# Patient Record
Sex: Female | Born: 1970 | Race: White | Hispanic: No | Smoking: Former smoker
Health system: Southern US, Community
[De-identification: ages and names within clinical notes are randomized; demographics above are authoritative.]

## PROBLEM LIST (undated history)

## (undated) DIAGNOSIS — F419 Anxiety disorder, unspecified: Secondary | ICD-10-CM

## (undated) DIAGNOSIS — F32A Depression, unspecified: Secondary | ICD-10-CM

## (undated) DIAGNOSIS — G4700 Insomnia, unspecified: Secondary | ICD-10-CM

## (undated) DIAGNOSIS — F329 Major depressive disorder, single episode, unspecified: Secondary | ICD-10-CM

## (undated) DIAGNOSIS — N809 Endometriosis, unspecified: Secondary | ICD-10-CM

## (undated) DIAGNOSIS — G43909 Migraine, unspecified, not intractable, without status migrainosus: Secondary | ICD-10-CM

## (undated) DIAGNOSIS — N200 Calculus of kidney: Secondary | ICD-10-CM

## (undated) HISTORY — DX: Major depressive disorder, single episode, unspecified: F32.9

## (undated) HISTORY — DX: Depression, unspecified: F32.A

## (undated) HISTORY — DX: Insomnia, unspecified: G47.00

## (undated) HISTORY — DX: Endometriosis, unspecified: N80.9

## (undated) HISTORY — PX: ENDOMETRIAL ABLATION: SHX621

---
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
## 2000-01-09 ENCOUNTER — Other Ambulatory Visit: Admission: RE | Admit: 2000-01-09 | Discharge: 2000-01-09 | Payer: Self-pay | Admitting: Obstetrics and Gynecology

## 2000-05-02 ENCOUNTER — Other Ambulatory Visit: Admission: RE | Admit: 2000-05-02 | Discharge: 2000-05-02 | Payer: Self-pay | Admitting: Obstetrics and Gynecology

## 2001-10-02 ENCOUNTER — Encounter: Admission: RE | Admit: 2001-10-02 | Discharge: 2001-10-02 | Payer: Self-pay | Admitting: Obstetrics and Gynecology

## 2001-10-02 ENCOUNTER — Encounter: Payer: Self-pay | Admitting: Obstetrics and Gynecology

## 2001-12-04 ENCOUNTER — Encounter: Admission: RE | Admit: 2001-12-04 | Discharge: 2001-12-04 | Payer: Self-pay | Admitting: Obstetrics and Gynecology

## 2001-12-04 ENCOUNTER — Encounter: Payer: Self-pay | Admitting: Obstetrics and Gynecology

## 2002-06-01 ENCOUNTER — Encounter: Payer: Self-pay | Admitting: Obstetrics and Gynecology

## 2002-06-01 ENCOUNTER — Encounter: Admission: RE | Admit: 2002-06-01 | Discharge: 2002-06-01 | Payer: Self-pay | Admitting: Obstetrics and Gynecology

## 2002-07-07 ENCOUNTER — Encounter: Admission: RE | Admit: 2002-07-07 | Discharge: 2002-07-07 | Payer: Self-pay | Admitting: Obstetrics and Gynecology

## 2002-07-07 ENCOUNTER — Encounter: Payer: Self-pay | Admitting: Obstetrics and Gynecology

## 2002-08-18 ENCOUNTER — Ambulatory Visit (HOSPITAL_COMMUNITY): Admission: RE | Admit: 2002-08-18 | Discharge: 2002-08-18 | Payer: Self-pay | Admitting: Obstetrics and Gynecology

## 2002-08-18 ENCOUNTER — Encounter (INDEPENDENT_AMBULATORY_CARE_PROVIDER_SITE_OTHER): Payer: Self-pay

## 2002-09-02 HISTORY — PX: PELVIC LAPAROSCOPY: SHX162

## 2002-10-05 ENCOUNTER — Other Ambulatory Visit: Admission: RE | Admit: 2002-10-05 | Discharge: 2002-10-05 | Payer: Self-pay | Admitting: Internal Medicine

## 2002-12-16 ENCOUNTER — Encounter (INDEPENDENT_AMBULATORY_CARE_PROVIDER_SITE_OTHER): Payer: Self-pay | Admitting: Specialist

## 2002-12-16 ENCOUNTER — Ambulatory Visit (HOSPITAL_BASED_OUTPATIENT_CLINIC_OR_DEPARTMENT_OTHER): Admission: RE | Admit: 2002-12-16 | Discharge: 2002-12-16 | Payer: Self-pay | Admitting: Obstetrics and Gynecology

## 2003-03-30 ENCOUNTER — Other Ambulatory Visit: Admission: RE | Admit: 2003-03-30 | Discharge: 2003-03-30 | Payer: Self-pay | Admitting: Gynecology

## 2003-04-14 ENCOUNTER — Encounter: Admission: RE | Admit: 2003-04-14 | Discharge: 2003-07-13 | Payer: Self-pay | Admitting: Gynecology

## 2003-10-27 ENCOUNTER — Inpatient Hospital Stay (HOSPITAL_COMMUNITY): Admission: RE | Admit: 2003-10-27 | Discharge: 2003-10-30 | Payer: Self-pay | Admitting: Gynecology

## 2003-12-15 ENCOUNTER — Other Ambulatory Visit: Admission: RE | Admit: 2003-12-15 | Discharge: 2003-12-15 | Payer: Self-pay | Admitting: Gynecology

## 2004-03-16 ENCOUNTER — Other Ambulatory Visit: Admission: RE | Admit: 2004-03-16 | Discharge: 2004-03-16 | Payer: Self-pay | Admitting: Gynecology

## 2005-06-12 ENCOUNTER — Encounter: Admission: RE | Admit: 2005-06-12 | Discharge: 2005-06-12 | Payer: Self-pay | Admitting: Emergency Medicine

## 2005-08-22 ENCOUNTER — Encounter: Admission: RE | Admit: 2005-08-22 | Discharge: 2005-08-22 | Payer: Self-pay | Admitting: Emergency Medicine

## 2005-08-30 ENCOUNTER — Other Ambulatory Visit: Admission: RE | Admit: 2005-08-30 | Discharge: 2005-08-30 | Payer: Self-pay | Admitting: Gynecology

## 2006-09-22 ENCOUNTER — Other Ambulatory Visit: Admission: RE | Admit: 2006-09-22 | Discharge: 2006-09-22 | Payer: Self-pay | Admitting: Gynecology

## 2007-09-25 ENCOUNTER — Other Ambulatory Visit: Admission: RE | Admit: 2007-09-25 | Discharge: 2007-09-25 | Payer: Self-pay | Admitting: Gynecology

## 2008-06-03 ENCOUNTER — Emergency Department (HOSPITAL_COMMUNITY): Admission: EM | Admit: 2008-06-03 | Discharge: 2008-06-04 | Payer: Self-pay | Admitting: Emergency Medicine

## 2009-05-09 ENCOUNTER — Ambulatory Visit: Payer: Self-pay | Admitting: Gynecology

## 2009-05-09 ENCOUNTER — Encounter: Payer: Self-pay | Admitting: Gynecology

## 2009-05-09 ENCOUNTER — Other Ambulatory Visit: Admission: RE | Admit: 2009-05-09 | Discharge: 2009-05-09 | Payer: Self-pay | Admitting: Gynecology

## 2009-05-10 ENCOUNTER — Ambulatory Visit: Payer: Self-pay | Admitting: Gynecology

## 2009-09-02 HISTORY — PX: TONSILLECTOMY: SUR1361

## 2011-01-18 NOTE — H&P (Signed)
NAME:  LELIANA, KONTZ                        ACCOUNT NO.:  [id number]   MEDICAL RECORD NO.:  [id number]                   PATIENT TYPE:  AMB   LOCATION:  [facility]C                                  FACILITY:  [facility]   PHYSICIAN:  Artist Pais, M.D.                 DATE OF BIRTH:  [birth year]   DATE OF ADMISSION:  08/18/2002  DATE OF DISCHARGE:                                HISTORY & PHYSICAL   HISTORY OF PRESENT ILLNESS:  The patient is a 40 year old Caucasian female,  gravida 1, para 1, who is in the progress of attempting pregnancy.  She has  continued to have significant dysmenorrhea and left lower quadrant pain, as  well as menorrhagia.  As a work-up for the menorrhagia and continued  dysmenorrhea, she underwent a pelvic ultrasound which showed an 11.1 mm  thickness.  Because of the abnormal uterine bleeding and endometrial  thickening seen on pelvic ultrasound, she underwent a sonohysterogram and  was found to have diffuse endometrial thickening throughout the endometrial  cavity without evidence of a focal polyp, but diffuse endometrial  thickening, at one point 2 cm.  The decision was made to undergo D&C and  hysteroscopy because of the several areas of thickening which appear to be  polypoid in nature.  The risks of surgery, including anesthetic  complications, hemorrhage, infection, and damage to adjacent structures,  including bladder, bowel, blood vessels, and ureters were discussed with the  patient.  She was made aware of the risk of uterine perforation which could  result in overwhelming life-threatening hemorrhage requiring emergent  hysterectomy or uterine perforation which could result in bowel damage  resulting in emergent colostomy for overwhelming life-threatening  peritonitis.  She expresses understand of and acceptance of these risks and  knows that we need to look at these polypoid areas more closely.   PAST OBSTETRICAL AND GYNECOLOGICAL HISTORY:  Menarche at age  10-13.  Cycle  interval duration 28-30 days with a four-day duration of flow with extremely  heavy cycle the first two days and significant dysmenorrhea.   PAST MEDICAL HISTORY:  1. Depression and anxiety.  2. Infertility.   ALLERGIES:  PENICILLIN and SULFA.   CURRENT MEDICATIONS:  1. Zoloft 50 mg daily.  2. Aleve.  3. Prenatal vitamins.   PAST SURGICAL HISTORY:  C-section in August of 1997.   FAMILY HISTORY:  There is no family history of colon, breast, ovarian, or  prostate cancer.  The patient's mother is 37, alive and well.  Her father is  60, alive and well.  One sister, age 60, alive and well.  One grandmother  died of  an MI.  Her maternal grandparents both have Alzheimer's.   SOCIAL HISTORY:  The patient is a Museum/gallery exhibitions officer.  She smokes one-  and-a-half packs of cigarettes per day and drinks alcohol socially.   REVIEW OF SYSTEMS:  Noncontributory, except as noted above.  Denies  headache, visual changes, chest pain, shortness of breath, abdominal pain,  change in bowel habits, unintentional weight loss, dysuria, urgency,  frequency, vaginal discharge, and pain or bleeding with intercourse.   PHYSICAL EXAMINATION:  GENERAL APPEARANCE:  A well-developed Caucasian  female.  VITAL SIGNS:  Blood pressure 102/70, heart rate 72.  WEIGHT:  150 pounds.  HEENT:  Normal.  NECK:  Supple without thyromegaly, adenopathy, or nodules.  CHEST:  Clear to auscultation.  BREASTS:  Symmetric without masses.  No dimpling, retraction, or nipple  discharge.  CARDIAC:  Regular rate and rhythm without extra sounds or murmurs.  ABDOMEN:  Soft and nontender.  No hepatosplenomegaly or masses.  PELVIC:  Normal external female genitalia.  No vulvar, vaginal, or cervical  lesions.  A Pap smear performed on July 02, 2001, was within normal  limits.  The patient is scheduled for another annual exam.  The pelvic  examination revealed the uterus to be normal and nontender without any   adnexal mass palpated.  RECTAL:  Excellent sphincter tone.  Confirms pelvic exam.  No masses  palpated.   IMPRESSION AND PLAN:  The patient is a 40 year old Caucasian female with  diffuse polypoid thickening on sonohysterogram, menorrhea, and dysmenorrhea,  admitted for [facility] and hysteroscopy to rule out an endometrial polyp.  The  patient expresses an understand of and acceptance of these risks and desires  to proceed with surgery.                                                Artist Pais, M.D.    DC/MEDQ  D:  08/18/2002  T:  08/18/2002  Job:  [id number]

## 2011-01-18 NOTE — Op Note (Signed)
NAME:  Marie Ingram, Marie Ingram                        ACCOUNT NO.:  [id number]   MEDICAL RECORD NO.:  [id number]                   PATIENT TYPE:  INP   LOCATION:  9120                                 FACILITY:  [facility]   PHYSICIAN:  Timothy P. Fontaine, M.D.           DATE OF BIRTH:  1970-12-09   DATE OF PROCEDURE:  10/27/2003  DATE OF DISCHARGE:                                 OPERATIVE REPORT   PREOPERATIVE DIAGNOSIS:  Pregnancy at term, prior cesarean section, desires  repeat cesarean section, mullerian abnormality.   POSTOPERATIVE DIAGNOSIS:  Pregnancy at term, prior cesarean section, desires  repeat cesarean section, mullerian abnormality.   PROCEDURE:  Repeat low transverse cervical cesarean section.   SURGEON:  Timothy P. Fontaine, M.D.   ASSISTANT:  Ivor Costa. Farrel Gobble, M.D.   ANESTHESIA:  Spinal.   ESTIMATED BLOOD LOSS:  Less than 500 mL.   COMPLICATIONS:  None.   FINDINGS:  At 30, normal female, Apgars 9 and 9, weight 7 pounds 14 ounces.  On pelvic anatomy inspection, the patient appears to have a rudimentary horn  on the right with a small protuberance at the level of the round ligament  insertion.  There was no fallopian tube identified.  There is a normal  appearing ovary on that side.  The left side is normal with normal fallopian  tube and ovary.  Of note, the lower uterine segment was transparent upon  entry to the abdomen with an extremely thin lower uterine segment.   DESCRIPTION OF PROCEDURE:  The patient was taken to the operating room,  underwent spinal anesthesia, was placed in the left tilt supine position,  received an abdominal preparation with Betadine solution.  The bladder was  empty with indwelling Foley catheterization placed under sterile technique  by nursing personnel.  The patient was draped in the usual fashion.  After  assuring adequate anesthesia, the abdomen was sharply entered through a  repeat Pfannenstiel incision achieving adequate hemostasis at  all levels.  The bladder flap was sharply developed.  The lower uterine segment was  transparent with vernix visualized through the thin lower uterine segment  which was sharply incised and bluntly extended laterally.  The fluid was  noted to be clear.  The infants head was delivered through the incision and  the nares and mouth were suctioned.  The rest of the infant was delivered.  The cord was doubly clamped and cut.  The infant was handed to pediatrics in  attendance.  Samples of cord blood were obtained.  The placenta was  spontaneously extracted and noted to be intact.  The uterus was exteriorized  and the endometrial cavity explored with a sponge to remove all placental  and membrane fragments.  The patient received 1 gram Ancef antibiotic  prophylaxis.  The lower uterine segment was initially closed in one layer  using 0 Vicryl suture in a running interlocking stitch.  A second  imbricating suture was  then initially placed but due to the thin, fragile  nature of the lower uterine segment, the initial sutures tore and it was  decided to abandon the second row.  Inspection of the pelvic anatomy was  noted as above.  The uterus was returned to the abdomen which was copiously  irrigated.  Adequate hemostasis was visualized.  The anterior fascia was  reapproximated using 0 Vicryl suture in a running stitch.  The subcutaneous  tissues were irrigated, adequate hemostasis was achieved with  electrocautery.  The skin was reapproximated using 4-0 Vicryl in a running  subcuticular stitch.  Steri-Strips and Benzoin were applied.  A sterile  dressing was applied.  The patient was taken to the recovery room in good  condition having tolerated the procedure well.                                               Timothy P. Audie Box, M.D.    TPF/MEDQ  D:  10/27/2003  T:  10/27/2003  Job:  [id number]

## 2011-01-18 NOTE — Discharge Summary (Signed)
NAME:  Marie Ingram, Marie Ingram                        ACCOUNT NO.:  [id number]   MEDICAL RECORD NO.:  [id number]                   PATIENT TYPE:  INP   LOCATION:  9120                                 FACILITY:  [facility]   PHYSICIAN:  Charles A. Sydnee Cabal, MD            DATE OF BIRTH:  16-Nov-1970   DATE OF ADMISSION:  10/27/2003  DATE OF DISCHARGE:  10/30/2003                                 DISCHARGE SUMMARY   PRIMARY DISCHARGE DIAGNOSES:  1. Term pregnancy.  2. Primary cesarean section.   PROCEDURE:  Repeat low transverse cesarean section.   DISPOSITION:  The patient was discharged home to follow up in the office  with Dr. Audie Box in 10 days.  She is given a prescription for Percocet  5/325 one to two p.o. q.4h. as needed, #40.   CONVALESCENCE INSTRUCTIONS:  1. Notify of temperature greater than 101 or increased pain or bleeding.  2. Precautioned against driving for about 10 days.  3. Take a shower for two weeks and not a bath.  4. No heavy lifting greater than 25 pounds.   LABORATORY DATA:  Postoperative hematocrit on postoperative day number one  was 30.6, hemoglobin 10.2.   HOSPITAL COURSE:  The patient was admitted and underwent surgery as noted  above.  Postoperatively, she had a routine postoperative course.  She had  good voiding after the Foley catheter was discontinued on postoperative day  number one.  She had no problems with return of flatus and was given a  general diet on postoperative day number one and tolerated her diet well.  She was given p.o. medications and did well.  Continued on postoperative day  number two doing well and is discharged home now on postoperative day number  three without any complications.  She is to follow up as noted above.                                               Charles A. Sydnee Cabal, MD    CAD/MEDQ  D:  10/30/2003  T:  10/30/2003  Job:  [id number]

## 2011-01-18 NOTE — H&P (Signed)
NAME:  Marie Ingram, Marie Ingram                        ACCOUNT NO.:  [id number]   MEDICAL RECORD NO.:  [id number]                   PATIENT TYPE:  INP   LOCATION:  NA                                   FACILITY:  [facility]   PHYSICIAN:  Timothy P. Fontaine, M.D.           DATE OF BIRTH:  Feb 15, 1971   DATE OF ADMISSION:  10/27/2003  DATE OF DISCHARGE:                                HISTORY & PHYSICAL   CHIEF COMPLAINT:  1. Pregnancy at term.  2. History of prior cesarean section, desires repeat cesarean section.   HISTORY OF PRESENT ILLNESS:  A 40 year old G29, P23 female at term gestation,  history of cesarean section for breech presentation who desires repeat  cesarean section after counseling for trial of labor.  For the remainder of  her history, see her Hollister examination.   PHYSICAL EXAMINATION:  HEENT:  Normal.  LUNGS:  Clear.  CARDIOVASCULAR:  Regular rate without murmurs, rubs, or gallops.  ABDOMEN:  Gravid term fundus, positive fetal heart tones.  PELVIC:  Deferred.   ASSESSMENT:  A 40 year old G2, P77 female term pregnancy, prior cesarean  section who desires repeat cesarean section after trial of labor counseling.  The risks, benefits, indications and alternatives were reviewed to include  bleeding, transfusion, infection, prolonged antibiotics, abscess formation  requiring reoperation and drainage, wound complications requiring opening  and draining of incisions, closure by secondary intention, inadvertant  injury to internal organs including bowel, bladder, ureters, vessels and  nerves necessitating major exploratory reparative surgeries and future  reparative surgeries including ostomy formation as well as fetal injury,  musculoskeletal, neural, scalpel injuries, all of which were understood and  accepted.  The patient does not want tubal sterilization at the same time as  her repeat cesarean section.                                               Timothy P. Audie Box, M.D.    TPF/MEDQ  D:  10/10/2003  T:  10/10/2003  Job:  [id number]

## 2011-01-18 NOTE — Op Note (Signed)
NAME:  JALEY, YAN                        ACCOUNT NO.:  [id number]   MEDICAL RECORD NO.:  [id number]                   PATIENT TYPE:  AMB   LOCATION:  SDC                                  FACILITY:  [facility]   PHYSICIAN:  Artist Pais, M.D.                 DATE OF BIRTH:  06/24/1971   DATE OF PROCEDURE:  08/18/2002  DATE OF DISCHARGE:                                 OPERATIVE REPORT   PREOPERATIVE DIAGNOSES:  1. Dysmenorrhea, menorrhagia.  2. Polypoid endometrium on sonohysterogram.   POSTOPERATIVE DIAGNOSES:  1. Dysmenorrhea, menorrhagia.  2. Polypoid endometrium on sonohysterogram.   PROCEDURE:  Dilatation and curettage, hysteroscopy, polypectomy.   SURGEON:  Artist Pais, M.D.   ANESTHESIA:  General endotracheal plus 20 cc 1% lidocaine paracervical  block.   ESTIMATED BLOOD LOSS:  Minimal.   FLUIDS:  1600 cc crystalloid.   COMPLICATIONS:  None.   DRAINS:  None.   FINDINGS:  Polypoid endometrium of the left side wall.   DESCRIPTION OF PROCEDURE:  The patient was brought to the operating room and  identified on the operating room table.  After induction adequate general  endotracheal anesthesia, the patient was placed in the dorsal lithotomy  position and prepped and draped in the usual sterile fashion.  The bladder  was straight catheterized for approximately 50 cc of clear yellow urine.  Examination under anesthesia revealed the uterus to be small, anteverted.  A  speculum was placed, and the anterior lip of the cervix was grasped with a  single-tooth tenaculum.  The cervix was very gently dilated up to a #25  Pratt dilator.  Dilatation proceeded very gently to decrease the risk of  uterine perforation.  The uterus sounded to 7 cm.  Using sorbitol as a  distending medium, the [facility] hysteroscope was placed without difficulty and a  careful and thorough hysteroscopic examination was performed.  The left  tubal ostium was identified.  At the fundus, there was noted  to be normal  endometrium.  However, along the left uterine sidewall, there was noted to  be copious polypoid endometrium and thickened endometrium on the posterior  uterine wall as well.  I was unable to see the right tubal ostium as there  was a bubble obscuring the ostium, and I was unable to move the bubble.  Subsequently, the scope was withdrawn, and using a serrated curet, a careful  curettage was performed in a clockwise position.  The Randall stone forceps  were placed, and additional tissue was obtained.  The uterus was curetted  until a good cry was heard all around with copious polypoid tissue obtained  from the left sidewall.  The hysteroscope was again placed after a good cry  was heard all around and the all of the polypoid was noted to be removed in  its entirety, and the endometrium was noted to be sampled completed. At that  point, the  procedure was then terminated.  The patient was given 20 cc of 1%  lidocaine paracervical block for postoperative comfort.  The tenaculum was  removed.  There was noted to be no bleeding from the tenaculum or needle  injection site at the time of termination of the case.  The patient was  transferred to the recovery room in stable condition after all instrument,  sponge, needle counts were correct.   DISPOSITION:  She was given a postoperative D&C instruction sheet and urged  to call if there were any problems, and to return to the office in two to  three weeks for a postoperative evaluation.  She had previously been given  Ultram in the office, and she can take Ultram 50 mg every six hours as  needed for pain.                                               Artist Pais, M.D.    DC/MEDQ  D:  08/18/2002  T:  08/19/2002  Job:  [id number]

## 2011-01-18 NOTE — Op Note (Signed)
NAME:  Marie Ingram, Marie Ingram                        ACCOUNT NO.:  [id number]   MEDICAL RECORD NO.:  [id number]                   PATIENT TYPE:  AMB   LOCATION:  [facility]                                 FACILITY:  [facility]   PHYSICIAN:  Daniel L. Eda Paschal, M.D.           DATE OF BIRTH:  Sep 30, 1970   DATE OF PROCEDURE:  12/16/2002  DATE OF DISCHARGE:                                 OPERATIVE REPORT   PREOPERATIVE DIAGNOSES:  Pelvic pain, dyspareunia, endometriosis suspected.   POSTOPERATIVE DIAGNOSES:  Pelvic pain, dyspareunia, endometriosis, pelvic  adhesions.   OPERATION:  Laparoscopy with laser vaporization of both endometriosis and  adhesions.   SURGEON:  Daniel L. Eda Paschal, M.D.   ANESTHESIA:  General endotracheal.   INDICATIONS FOR PROCEDURE:  The patient is a 40 year old, gravida 1, para 1,  AB 0 who came to see me because of mid cycle bleeding, severe abdominal  cramping and significant dyspareunia on deep penetration. The patient has a  mother whose had endometriosis and endometriosis has been strongly suspected  based on the above. Ultrasound failed to reveal any endometriomas of either  ovary. She has not responded to medical therapy. She now enters the hospital  for laparoscopy and laser if appropriate.   FINDINGS:  At the time of surgery, the patient's uterus was significantly  involved with white plaques of endometriosis. It was mostly on the top of  the fundus and the posterior portion of the fundus and involved  approximately one-third of that area. It appeared to be somewhere between  superficial and deep. The patient had a very unusual right fallopian tube,  it had fusiform swelling of it in the proximal portion and then it appeared  almost as if the tube was missing in the middle portion and then normal  fimbria could be seen. The patient's right ovary was completely normal. The  patient's ileocecal junction was identified and her appendix was normal.  Right upper  quadrant was visualized and there was no disease. The patient  had an area of white endometriosis under her left uterosacral ligament of  approximately 1 1/2 cm, it was more superficial than deep. The patient's  left fallopian tube was normal with luxuriant fimbria. The patient's left  ovary had a deposit of endometriosis that was pigmented of about 1+ cm. It  was superficial rather than deep. The patient had adhesions of her sigmoid  colon that extended down to the round ligament on the left possibly related  to her previous cesarean section. In the cul-de-sac, there was no  endometriosis; however, there was extreme vascularity especially involving  the left uterosacral ligament. The ureters could be well seen and they  peristalsed normally and there was no adhesions involving them. 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 You really could not even see the dye extending to the  proximal portion of the right fallopian tube.   DESCRIPTION OF PROCEDURE:  After adequate general endotracheal anesthesia,  the patient was placed in the dorsal lithotomy position, prepped and draped  in the usual sterile manner. Her bladder was entered with a Robinson  catheter, a Jarcho  cannula was placed in the uterus, a pneumoperitoneum was  created with the Veress needle introduced subumbilically; through that the  operating laparoscope was placed. It was attached to a camera for  magnification. Two ports were placed in the lower part of her pelvis, one in  the midline and one in the left lower quadrant, through that a variety of  instrumentations was placed. First inspection of all pelvic organs was done  systematically and was noted above. 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 Using neodymium YAG laser  with a G4 tip over the bare fiber, 12 watts power, all areas of  endometriosis, first on the uterus and then on the left ovary were lasered  and removed. The ureters were easy to see and it was easy to stay away from  them. The white area of endometriosis and the left broad ligament could be  removed by elevating it and completely excising it and it was sent to  pathology for tissue diagnosis. The adhesions involving the colon to the  round ligament were taken down with the laser in such a fashion that the  colon now was completely away from the left adnexa. There were several areas  where a little bleeding was encountered and this was controlled with bipolar  coagulation. At the termination of the procedure, there was no bleeding  whatsoever. As noted above, dye would spill from the left tube but not the  right tube. All cul-de-sac fluid was removed, the pneumoperitoneum was  evacuated. The subumbilical fascial incisions closed with #0 Vicryl and the  skin incisions were closed with 3-0 Monocryl. Estimated blood loss for the  entire procedure was less than 100 mL with none replaced.                                               Daniel L. Eda Paschal, M.D.    Tonette Bihari  D:  12/16/2002  T:  12/16/2002  Job:  [id number]

## 2011-06-04 LAB — DIFFERENTIAL
Basophils Relative: 0
Eosinophils Absolute: 0.1
Lymphocytes Relative: 15

## 2011-06-04 LAB — COMPREHENSIVE METABOLIC PANEL
ALT: 12
Albumin: 3.8
Alkaline Phosphatase: 56
BUN: 18
CO2: 24
Chloride: 112
GFR calc Af Amer: >60
GFR calc non Af Amer: >60
Sodium: 138
Total Bilirubin: 0.5
Total Protein: 6.4

## 2011-06-04 LAB — CBC
MCHC: 32.8
Platelets: 228
RBC: 4.44
WBC: 8.2

## 2011-06-04 LAB — URINALYSIS, ROUTINE W REFLEX MICROSCOPIC
Specific Gravity, Urine: 1.029
Urobilinogen, UA: 0.2
pH: 6

## 2011-06-04 LAB — WET PREP, GENITAL
Trich, Wet Prep: NONE SEEN
Yeast Wet Prep HPF POC: NONE SEEN

## 2013-01-29 ENCOUNTER — Other Ambulatory Visit (HOSPITAL_COMMUNITY): Payer: Self-pay | Admitting: Physician Assistant

## 2013-01-29 ENCOUNTER — Ambulatory Visit (HOSPITAL_COMMUNITY)
Admission: RE | Admit: 2013-01-29 | Discharge: 2013-01-29 | Disposition: A | Discharge status: Home / Self Care | Payer: BC Managed Care – PPO | Source: Ambulatory Visit | Attending: Physician Assistant | Admitting: Physician Assistant

## 2013-01-29 DIAGNOSIS — S93609A Unspecified sprain of unspecified foot, initial encounter: Secondary | ICD-10-CM

## 2013-01-29 DIAGNOSIS — W010XXA Fall on same level from slipping, tripping and stumbling without subsequent striking against object, initial encounter: Secondary | ICD-10-CM | POA: Insufficient documentation

## 2013-01-29 DIAGNOSIS — Y92009 Unspecified place in unspecified non-institutional (private) residence as the place of occurrence of the external cause: Secondary | ICD-10-CM | POA: Insufficient documentation

## 2013-01-29 DIAGNOSIS — M79609 Pain in unspecified limb: Secondary | ICD-10-CM | POA: Insufficient documentation

## 2013-01-29 DIAGNOSIS — M7989 Other specified soft tissue disorders: Secondary | ICD-10-CM | POA: Insufficient documentation

## 2013-04-09 ENCOUNTER — Emergency Department (HOSPITAL_BASED_OUTPATIENT_CLINIC_OR_DEPARTMENT_OTHER): Payer: BC Managed Care – PPO

## 2013-04-09 ENCOUNTER — Emergency Department (HOSPITAL_BASED_OUTPATIENT_CLINIC_OR_DEPARTMENT_OTHER)
Admission: EM | Admit: 2013-04-09 | Discharge: 2013-04-09 | Disposition: A | Discharge status: Home / Self Care | Payer: BC Managed Care – PPO | Attending: Emergency Medicine | Admitting: Emergency Medicine

## 2013-04-09 ENCOUNTER — Encounter (HOSPITAL_BASED_OUTPATIENT_CLINIC_OR_DEPARTMENT_OTHER): Payer: Self-pay | Admitting: Emergency Medicine

## 2013-04-09 DIAGNOSIS — Z79899 Other long term (current) drug therapy: Secondary | ICD-10-CM | POA: Insufficient documentation

## 2013-04-09 DIAGNOSIS — R112 Nausea with vomiting, unspecified: Secondary | ICD-10-CM | POA: Insufficient documentation

## 2013-04-09 DIAGNOSIS — Z3202 Encounter for pregnancy test, result negative: Secondary | ICD-10-CM | POA: Insufficient documentation

## 2013-04-09 DIAGNOSIS — N200 Calculus of kidney: Secondary | ICD-10-CM | POA: Insufficient documentation

## 2013-04-09 DIAGNOSIS — G43909 Migraine, unspecified, not intractable, without status migrainosus: Secondary | ICD-10-CM

## 2013-04-09 DIAGNOSIS — F411 Generalized anxiety disorder: Secondary | ICD-10-CM | POA: Insufficient documentation

## 2013-04-09 HISTORY — DX: Anxiety disorder, unspecified: F41.9

## 2013-04-09 HISTORY — DX: Migraine, unspecified, not intractable, without status migrainosus: G43.909

## 2013-04-09 HISTORY — DX: Calculus of kidney: N20.0

## 2013-04-09 LAB — URINALYSIS, ROUTINE W REFLEX MICROSCOPIC
Hgb urine dipstick: NEGATIVE
Ketones, ur: NEGATIVE mg/dL

## 2013-04-09 LAB — CBC WITH DIFFERENTIAL/PLATELET
Basophils Absolute: 0 10*3/uL (ref 0.0–0.1)
Basophils Relative: 0 % (ref 0–1)
HCT: 40.2 % (ref 36.0–46.0)
Hemoglobin: 12.8 g/dL (ref 12.0–15.0)
Lymphocytes Relative: 7 % — ABNORMAL LOW (ref 12–46)
Monocytes Absolute: 0.4 10*3/uL (ref 0.1–1.0)
Neutro Abs: 9.2 10*3/uL — ABNORMAL HIGH (ref 1.7–7.7)
RBC: 4.49 MIL/uL (ref 3.87–5.11)
RDW: 14.9 % (ref 11.5–15.5)
WBC: 10.3 10*3/uL (ref 4.0–10.5)

## 2013-04-09 LAB — BASIC METABOLIC PANEL
BUN: 15 mg/dL (ref 6–23)
CO2: 27 mEq/L (ref 19–32)
Chloride: 100 mEq/L (ref 96–112)
Creatinine, Ser: 0.6 mg/dL (ref 0.50–1.10)
GFR calc Af Amer: >90 mL/min (ref >90)
GFR calc non Af Amer: >90 mL/min (ref >90)
Potassium: 4.1 mEq/L (ref 3.5–5.1)
Sodium: 138 mEq/L (ref 135–145)

## 2013-04-09 MED ORDER — METOCLOPRAMIDE HCL 5 MG/ML IJ SOLN
10.0000 mg | Freq: Once | INTRAMUSCULAR | Status: AC
  Administered 2013-04-09: 10 mg via INTRAMUSCULAR

## 2013-04-09 MED ORDER — PROMETHAZINE HCL 25 MG RE SUPP: 25.0000 mg | Freq: Four times a day (QID) | RECTAL | Status: DC | PRN

## 2013-04-09 MED ORDER — KETOROLAC TROMETHAMINE 30 MG/ML IJ SOLN
30.0000 mg | Freq: Once | INTRAMUSCULAR | Status: DC
  Filled 2013-04-09: qty 1

## 2013-04-09 MED ORDER — KETOROLAC TROMETHAMINE 30 MG/ML IJ SOLN
30.0000 mg | Freq: Once | INTRAMUSCULAR | Status: AC
  Administered 2013-04-09: 30 mg via INTRAMUSCULAR

## 2013-04-09 MED ORDER — METOCLOPRAMIDE HCL 5 MG/ML IJ SOLN
10.0000 mg | Freq: Once | INTRAMUSCULAR | Status: DC
  Filled 2013-04-09: qty 2

## 2013-04-09 MED ORDER — METOCLOPRAMIDE HCL 5 MG/ML IJ SOLN
10.0000 mg | Freq: Once | INTRAMUSCULAR | Status: AC
  Administered 2013-04-09: 10 mg via INTRAVENOUS
  Filled 2013-04-09: qty 2

## 2013-04-09 MED ORDER — HYDROMORPHONE HCL PF 1 MG/ML IJ SOLN
1.0000 mg | Freq: Once | INTRAMUSCULAR | Status: AC
  Administered 2013-04-09: 1 mg via INTRAVENOUS
  Filled 2013-04-09: qty 1

## 2013-04-09 NOTE — ED Notes (Signed)
MD at bedside. 

## 2013-04-09 NOTE — ED Notes (Signed)
Right flank pain and HA started yesterday.  Saw pmd.  Given Zofran, Flomax, and hydrocodone.  Today started vomiting.  Long hx of kidney stones but never vomited with them before. Passed on their own.  Took Imitrex at 1300.

## 2013-04-09 NOTE — ED Provider Notes (Signed)
CSN: [id number]     Arrival date & time 04/09/13  1518 History     First MD Initiated Contact with Patient 04/09/13 1654     Chief Complaint  Patient presents with  . Flank Pain  . Emesis  . Headache   (Consider location/radiation/quality/duration/timing/severity/associated sxs/prior Treatment) HPI Comments: Pt comes in with cc of right sided flank pain and headaches. Pt has hx of multiple renal stones. Her pain started yday, and is similar to her renal stones pain. She saw her PCP, she was provided with appropriate meds, and informed to go to the ER if there are nausea, emesis, fevers, chills. She started having nausea with emesis today, about 5-10 episodes of emesis, non bilioous, non bloody. No uti like, no hematuria, no vaginal discharge, bleeding. BM have been normal. No abd pain., No cough.  Pt has been having her migraine type headaches. No neuro complains. She suspects that the flank pain has led to flare up of her headache.  Patient is a 42 y.o. female presenting with flank pain, vomiting, and headaches. The history is provided by the patient.  Flank Pain Associated symptoms include headaches. Pertinent negatives include no chest pain, no abdominal pain and no shortness of breath.  Emesis Associated symptoms: headaches   Associated symptoms: no abdominal pain   Headache Associated symptoms: nausea and vomiting   Associated symptoms: no abdominal pain and no neck pain     Past Medical History  Diagnosis Date  . Migraines   . Kidney stones   . Anxiety    Past Surgical History  Procedure Laterality Date  . Cesarean section    . Laparoscopy     No family history on file. History  Substance Use Topics  . Smoking status: Never Smoker   . Smokeless tobacco: Not on file  . Alcohol Use: Yes     Comment: occ   OB History   Grav Para Term Preterm Abortions TAB SAB Ect Mult Living                 Review of Systems  Constitutional: Negative for activity change.   HENT: Negative for neck pain.   Respiratory: Negative for shortness of breath.   Cardiovascular: Negative for chest pain.  Gastrointestinal: Positive for nausea and vomiting. Negative for abdominal pain.  Genitourinary: Positive for flank pain. Negative for dysuria.  Neurological: Positive for headaches.    Allergies  Amoxicillin  Home Medications   Current Outpatient Rx  Name  Route  Sig  Dispense  Refill  . ALPRAZolam (XANAX) 0.5 MG tablet   Oral   Take 0.5 mg by mouth 4 (four) times daily.         . sertraline (ZOLOFT) 100 MG tablet   Oral   Take 150 mg by mouth daily.         . SUMAtriptan (IMITREX) 100 MG tablet   Oral   Take 100 mg by mouth every 2 (two) hours as needed for migraine.         . promethazine (PHENERGAN) 25 MG suppository   Rectal   Place 1 suppository (25 mg total) rectally every 6 (six) hours as needed for nausea.   12 each   0    BP 147/82  Pulse 91  Temp(Src) 97.9 F (36.6 C) (Oral)  Resp 16  Ht 5\' 5"  (1.651 m)  Wt 216 lb (97.977 kg)  BMI 35.94 kg/m2  SpO2 99%  LMP 04/02/2013 Physical Exam  Nursing note and vitals  reviewed. Constitutional: She is oriented to person, place, and time. She appears well-developed and well-nourished.  HENT:  Head: Normocephalic and atraumatic.  Eyes: EOM are normal. Pupils are equal, round, and reactive to light.  Neck: Neck supple.  Cardiovascular: Normal rate, regular rhythm and normal heart sounds.   No murmur heard. Pulmonary/Chest: Effort normal. No respiratory distress.  Abdominal: Soft. She exhibits no distension. There is no tenderness. There is no rebound and no guarding.  Neurological: She is alert and oriented to person, place, and time. No cranial nerve deficit. Coordination normal.  Skin: Skin is warm and dry.    ED Course   Procedures (including critical care time)  Labs Reviewed  CBC WITH DIFFERENTIAL - Abnormal; Notable for the following:    Neutrophils Relative % 89 (*)     Neutro Abs 9.2 (*)    Lymphocytes Relative 7 (*)    All other components within normal limits  BASIC METABOLIC PANEL - Abnormal; Notable for the following:    Glucose, Bld 105 (*)    All other components within normal limits  URINALYSIS, ROUTINE W REFLEX MICROSCOPIC  PREGNANCY, URINE   Ct Abdomen Pelvis Wo Contrast  04/09/2013   *RADIOLOGY REPORT*  Clinical Data: 42 year old female with right flank, abdominal and pelvic pain.  CT ABDOMEN AND PELVIS WITHOUT CONTRAST  Technique:  Multidetector CT imaging of the abdomen and pelvis was performed following the standard protocol without intravenous contrast.  Comparison: 06/04/2008 CT  Findings: A 4 mm nonobstructing right upper pole renal calculus is noted. There is no evidence of hydronephrosis or obstructing urinary calculi. The kidneys are otherwise unremarkable.  The liver, gallbladder, spleen, pancreas and adrenal glands are unremarkable.  Please note that parenchymal abnormalities may be missed as intravenous contrast was not administered.  No free fluid, enlarged lymph nodes, biliary dilation or abdominal aortic aneurysm identified.  The bowel, appendix and bladder are unremarkable. The uterus and adnexal regions are within normal limits. A small umbilical hernia containing fat is again noted.  No acute or suspicious bony abnormalities are identified.  IMPRESSION: No evidence of acute abnormality.  4 mm nonobstructing right upper pole renal calculus.   Original Report Authenticated By: Harmon Pier, M.D.   1. Nephrolithiasis   2. Migraine     MDM  Pt comes in with right sided flank pain and headaches. Has hx of multiple renal stones, but hasnt needed surgery. She states that her pain is similar to her previous attacks. Her pain has worsened and she has some nausea, emesis, despite taking meds prescribed, so we will get CT abdomen to look at the stone, and to ensure there is no other concerning etiology.  Headache is typical, with no neuro  deficits, or red flags.  7:59 PM Ct shows non obstructive stone. She had received 1 mg dilaudid, and her headache and flank pain have resolved. No concerns for emergent conditions like ovarian torsion, PID for the patient. Will d/c.  Derwood Kaplan, MD 04/09/13 2000

## 2013-04-09 NOTE — ED Notes (Signed)
Dr Rhunette Croft in room with patient now.

## 2013-04-09 NOTE — ED Notes (Signed)
Patient ambulatory to restroom without difficulty and with no assistance 

## 2013-08-23 ENCOUNTER — Other Ambulatory Visit: Payer: Self-pay | Admitting: Physician Assistant

## 2013-08-23 MED ORDER — ALPRAZOLAM 0.5 MG PO TABS: 0.5000 mg | ORAL_TABLET | Freq: Four times a day (QID) | ORAL | Status: DC

## 2013-09-17 ENCOUNTER — Ambulatory Visit (INDEPENDENT_AMBULATORY_CARE_PROVIDER_SITE_OTHER): Payer: BC Managed Care – PPO | Admitting: Physician Assistant

## 2013-09-17 ENCOUNTER — Encounter: Payer: Self-pay | Admitting: Physician Assistant

## 2013-09-17 VITALS — BP 118/78 | HR 88 | Temp 98.1°F | Resp 16 | Ht 65.0 in | Wt 226.0 lb

## 2013-09-17 DIAGNOSIS — J329 Chronic sinusitis, unspecified: Secondary | ICD-10-CM

## 2013-09-17 DIAGNOSIS — F411 Generalized anxiety disorder: Secondary | ICD-10-CM

## 2013-09-17 DIAGNOSIS — M26609 Unspecified temporomandibular joint disorder, unspecified side: Secondary | ICD-10-CM

## 2013-09-17 MED ORDER — AZITHROMYCIN 250 MG PO TABS: ORAL_TABLET | ORAL | Status: AC

## 2013-09-17 MED ORDER — ALPRAZOLAM 1 MG PO TABS: 1.0000 mg | ORAL_TABLET | Freq: Three times a day (TID) | ORAL | Status: DC | PRN

## 2013-09-17 MED ORDER — CYCLOBENZAPRINE HCL 10 MG PO TABS: ORAL_TABLET | ORAL | Status: DC

## 2013-09-17 MED ORDER — BUPROPION HCL ER (XL) 150 MG PO TB24: 150.0000 mg | ORAL_TABLET | ORAL | Status: DC

## 2013-09-17 MED ORDER — SUMATRIPTAN SUCCINATE 100 MG PO TABS: 100.0000 mg | ORAL_TABLET | ORAL | Status: DC | PRN

## 2013-09-17 NOTE — Patient Instructions (Signed)
What is the TMJ? The temporomandibular (tem-PUH-ro-man-DIB-yoo-ler) joint, or the TMJ, connects the upper and lower jawbones. This joint allows the jaw to open wide and move back and forth when you chew, talk, or yawn.There are also several muscles that help this joint move. There can be muscle tightness and pain in the muscle that can cause several symptoms.  What causes TMJ pain? There are many causes of TMJ pain. Repeated chewing (for example, chewing gum) and clenching your teeth can cause pain in the joint. Some TMJ pain has no obvious cause. What can I do to ease the pain? There are many things you can do to help your pain get better. When you have pain:  Eat soft foods and stay away from chewy foods (for example, taffy) Try to use both sides of your mouth to chew Don't chew gum Don't open your mouth wide (for example, during yawning or singing) Don't bite your cheeks or fingernails Lower your amount of stress and worry Applying a warm, damp washcloth to the joint may help. Over-the-counter pain medicines such as ibuprofen (one brand: Advil) or acetaminophen (one brand: Tylenol) might also help. Do not use these medicines if you are allergic to them or if your doctor told you not to use them. How can I stop the pain from coming back? When your pain is better, you can do these exercises to make your muscles stronger and to keep the pain from coming back:  Resisted mouth opening: Place your thumb or two fingers under your chin and open your mouth slowly, pushing up lightly on your chin with your thumb. Hold for three to six seconds. Close your mouth slowly. Resisted mouth closing: Place your thumbs under your chin and your two index fingers on the ridge between your mouth and the bottom of your chin. Push down lightly on your chin as you close your mouth. Tongue up: Slowly open and close your mouth while keeping the tongue touching the roof of the mouth. Side-to-side jaw movement: Place an  object about one fourth of an inch thick (for example, two tongue depressors) between your front teeth. Slowly move your jaw from side to side. Increase the thickness of the object as the exercise becomes easier Forward jaw movement: Place an object about one fourth of an inch thick between your front teeth and move the bottom jaw forward so that the bottom teeth are in front of the top teeth. Increase the thickness of the object as the exercise becomes easier. These exercises should not be painful. If it hurts to do these exercises, stop doing them and talk to your family doctor.    

## 2013-09-17 NOTE — Progress Notes (Signed)
   Subjective:    Patient ID: Marie Ingram, female    DOB: 1970/11/05, 43 y.o.   MRN: [id number]  HPI For one week she has been unable to sleep, mind racing, very anxious. She has been waking up with a right sided headache. She has had some post nasal drip, yellow/bloody mucus, cough in the morning. She is waking herself up snoring. Denies fever, chills, wheezing, SOB. She has been taking BC powders, aleve, mucinex/cough and nothing is helping.   Increase stress from the holidays, she is taking the xanax 3-4 times a day and taking two at a time so she is running out of medications.   Review of Systems  Constitutional: Negative.   HENT: Positive for congestion, postnasal drip, rhinorrhea and sinus pressure. Negative for dental problem, ear discharge, facial swelling, hearing loss, sneezing, sore throat, tinnitus, trouble swallowing and voice change.   Eyes: Negative.   Respiratory: Negative.   Cardiovascular: Negative.   Gastrointestinal: Negative.   Genitourinary: Negative.   Musculoskeletal: Positive for neck pain and neck stiffness. Negative for arthralgias, back pain, gait problem, joint swelling and myalgias.  Neurological: Positive for headaches. Negative for dizziness, tremors, seizures, syncope, facial asymmetry, speech difficulty, weakness, light-headedness and numbness.  Psychiatric/Behavioral: Positive for sleep disturbance and decreased concentration. Negative for suicidal ideas, hallucinations, behavioral problems, confusion, self-injury and agitation. The patient is nervous/anxious.        Objective:   Physical Exam  Constitutional: She appears well-developed and well-nourished.  HENT:  Head: Normocephalic and atraumatic.  Right Ear: External ear normal.  Nose: Right sinus exhibits frontal sinus tenderness. Left sinus exhibits frontal sinus tenderness.  Eyes: Conjunctivae and EOM are normal.  Neck: Normal range of motion. Neck supple.  + TMJ  Cardiovascular: Normal  rate, regular rhythm, normal heart sounds and intact distal pulses.   Pulmonary/Chest: Effort normal and breath sounds normal. No respiratory distress. She has no wheezes.  Abdominal: Soft. Bowel sounds are normal.  Lymphadenopathy:    She has no cervical adenopathy.  Skin: Skin is warm and dry.      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 Due to risk of GI bleed  SAD- add wellbutrin 150 XL during the winter, discussed light therapy  Anxiety/insomnia- Xanax 1 mg # 90 NR- discussed with patient if she uses this once a month we will stop prescribing it. We discussed addiction of the medication and to use sparingly.   Follow up in one month OVER 30 minutes of exam, counseling, chart review

## 2013-10-10 DIAGNOSIS — F419 Anxiety disorder, unspecified: Secondary | ICD-10-CM | POA: Insufficient documentation

## 2013-10-10 DIAGNOSIS — G4700 Insomnia, unspecified: Secondary | ICD-10-CM | POA: Insufficient documentation

## 2013-10-10 DIAGNOSIS — F330 Major depressive disorder, recurrent, mild: Secondary | ICD-10-CM | POA: Insufficient documentation

## 2013-10-10 DIAGNOSIS — G43909 Migraine, unspecified, not intractable, without status migrainosus: Secondary | ICD-10-CM | POA: Insufficient documentation

## 2013-10-10 DIAGNOSIS — N200 Calculus of kidney: Secondary | ICD-10-CM | POA: Insufficient documentation

## 2013-10-12 ENCOUNTER — Ambulatory Visit (INDEPENDENT_AMBULATORY_CARE_PROVIDER_SITE_OTHER): Payer: BC Managed Care – PPO | Admitting: Physician Assistant

## 2013-10-12 ENCOUNTER — Encounter: Payer: Self-pay | Admitting: Physician Assistant

## 2013-10-12 VITALS — BP 120/78 | HR 100 | Temp 99.0°F | Resp 16 | Ht 65.0 in | Wt 239.0 lb

## 2013-10-12 DIAGNOSIS — J0100 Acute maxillary sinusitis, unspecified: Secondary | ICD-10-CM

## 2013-10-12 MED ORDER — PROMETHAZINE-CODEINE 6.25-10 MG/5ML PO SYRP: 5.0000 mL | ORAL_SOLUTION | Freq: Four times a day (QID) | ORAL | Status: DC | PRN

## 2013-10-12 MED ORDER — PREDNISONE 20 MG PO TABS
ORAL_TABLET | ORAL | Status: DC
Start: 2013-10-12 — End: 2014-05-27

## 2013-10-12 MED ORDER — LEVOFLOXACIN 500 MG PO TABS: 500.0000 mg | ORAL_TABLET | Freq: Every day | ORAL | Status: DC

## 2013-10-12 NOTE — Patient Instructions (Signed)

## 2013-10-12 NOTE — Progress Notes (Signed)
   Subjective:    Patient ID: Marie Ingram, female    DOB: Nov 05, 1970, 43 y.o.   MRN: [id number]  Cough This is a new problem. The current episode started in the past 7 days. The problem has been gradually worsening. The cough is productive of sputum. Associated symptoms include chills, a fever, nasal congestion, postnasal drip, a rash, rhinorrhea and a sore throat. Pertinent negatives include no chest pain, ear congestion, ear pain, headaches, heartburn, hemoptysis, myalgias, shortness of breath, sweats, weight loss or wheezing. The symptoms are aggravated by lying down. Treatments tried: advil, cold and cough medication. The treatment provided no relief.     Review of Systems  Constitutional: Positive for fever and chills. Negative for weight loss.  HENT: Positive for postnasal drip, rhinorrhea and sore throat. Negative for ear pain.   Respiratory: Positive for cough. Negative for hemoptysis, chest tightness, shortness of breath and wheezing.   Cardiovascular: Negative.  Negative for chest pain.  Gastrointestinal: Negative for heartburn.  Musculoskeletal: Negative for myalgias.  Skin: Positive for rash.  Neurological: Negative for headaches.       Objective:   Physical Exam  Constitutional: She appears well-developed and well-nourished.  HENT:  Head: Normocephalic and atraumatic.  Right Ear: External ear normal.  Nose: Right sinus exhibits maxillary sinus tenderness. Right sinus exhibits no frontal sinus tenderness. Left sinus exhibits maxillary sinus tenderness. Left sinus exhibits no frontal sinus tenderness.  Eyes: Conjunctivae and EOM are normal.  Neck: Normal range of motion. Neck supple.  Cardiovascular: Normal rate, regular rhythm, normal heart sounds and intact distal pulses.   Pulmonary/Chest: Effort normal and breath sounds normal. No respiratory distress. She has no wheezes.  Abdominal: Soft. Bowel sounds are normal.  Lymphadenopathy:    She has cervical adenopathy.   Skin: Skin is warm and dry.      Assessment & Plan:  Acute maxillary sinusitis - Plan: levofloxacin (LEVAQUIN) 500 MG tablet, promethazine-codeine (PHENERGAN WITH CODEINE) 6.25-10 MG/5ML syrup, predniSONE (DELTASONE) 20 MG tablet

## 2013-10-18 ENCOUNTER — Other Ambulatory Visit: Payer: Self-pay | Admitting: Physician Assistant

## 2013-10-20 NOTE — Telephone Encounter (Signed)
LMOM TO SCHEDULE  

## 2013-11-14 ENCOUNTER — Other Ambulatory Visit: Payer: Self-pay | Admitting: Physician Assistant

## 2013-11-15 ENCOUNTER — Other Ambulatory Visit: Payer: Self-pay | Admitting: Physician Assistant

## 2013-11-15 MED ORDER — SUMATRIPTAN SUCCINATE 100 MG PO TABS: 100.0000 mg | ORAL_TABLET | ORAL | Status: DC | PRN

## 2013-12-14 ENCOUNTER — Other Ambulatory Visit: Payer: Self-pay | Admitting: Emergency Medicine

## 2014-01-12 ENCOUNTER — Other Ambulatory Visit: Payer: Self-pay | Admitting: Physician Assistant

## 2014-01-26 ENCOUNTER — Encounter: Payer: Self-pay | Admitting: Physician Assistant

## 2014-02-07 ENCOUNTER — Other Ambulatory Visit: Payer: Self-pay | Admitting: Internal Medicine

## 2014-02-10 ENCOUNTER — Other Ambulatory Visit: Payer: Self-pay | Admitting: Physician Assistant

## 2014-02-11 NOTE — Telephone Encounter (Signed)
Rx called into pharm 

## 2014-04-12 ENCOUNTER — Other Ambulatory Visit: Payer: Self-pay | Admitting: Physician Assistant

## 2014-04-12 MED ORDER — ALPRAZOLAM 1 MG PO TABS: ORAL_TABLET | ORAL | Status: DC

## 2014-04-12 MED ORDER — SUMATRIPTAN SUCCINATE 100 MG PO TABS: 100.0000 mg | ORAL_TABLET | ORAL | Status: DC | PRN

## 2014-04-15 ENCOUNTER — Ambulatory Visit: Payer: Self-pay | Admitting: Physician Assistant

## 2014-04-15 DIAGNOSIS — Z0000 Encounter for general adult medical examination without abnormal findings: Secondary | ICD-10-CM

## 2014-04-18 ENCOUNTER — Encounter: Payer: Self-pay | Admitting: Internal Medicine

## 2014-04-21 ENCOUNTER — Ambulatory Visit: Payer: Self-pay | Admitting: Physician Assistant

## 2014-05-05 ENCOUNTER — Other Ambulatory Visit: Payer: Self-pay | Admitting: Physician Assistant

## 2014-05-05 MED ORDER — SERTRALINE HCL 100 MG PO TABS: ORAL_TABLET | ORAL | Status: DC

## 2014-05-27 ENCOUNTER — Encounter: Payer: Self-pay | Admitting: Internal Medicine

## 2014-05-27 ENCOUNTER — Ambulatory Visit (INDEPENDENT_AMBULATORY_CARE_PROVIDER_SITE_OTHER): Payer: Self-pay | Admitting: Internal Medicine

## 2014-05-27 VITALS — BP 126/76 | HR 80 | Temp 97.9°F | Resp 18 | Ht 65.0 in | Wt 242.6 lb

## 2014-05-27 DIAGNOSIS — Z6841 Body Mass Index (BMI) 40.0 and over, adult: Secondary | ICD-10-CM

## 2014-05-27 DIAGNOSIS — R51 Headache: Secondary | ICD-10-CM

## 2014-05-27 MED ORDER — ATENOLOL 50 MG PO TABS: 50.0000 mg | ORAL_TABLET | Freq: Every day | ORAL | Status: DC

## 2014-05-27 MED ORDER — SERTRALINE HCL 100 MG PO TABS: ORAL_TABLET | ORAL | Status: DC

## 2014-05-27 NOTE — Progress Notes (Signed)
   Subjective:    Patient ID: Marie Ingram, female    DOB: 1971/01/13, 43 y.o.   MRN: [id number]  HPI Patient is a very nice 17 year patient of the practice who presents for refills. She relates hx/o menstrual migraineand describes the HA's as classic Vascular type HA's - usually unilateral in the peri-orbital area with  (+) visual aura with "zig zags" and assoc N/V.    Medication List   ALPRAZolam 1 MG tablet  TAKE 1 TABLET BY MOUTH 3 TIMES A DAY AS NEEDED     atenolol 50 MG tablet  Take 1 tablet (50 mg total) by mouth daily. For headache prophylaxis     sertraline 100 MG tablet  TAKE 1 TABLET BY MOUTH EVERY DAY     SUMAtriptan 100 MG tablet  Take 1 tablet every 2 hours as needed for migraine.     Allergies  Allergen Reactions  . Erythromycin Nausea Only  . Sulfa Antibiotics   . Amoxicillin Rash   Past Medical History  Diagnosis Date  . Migraines   . Anxiety   . Kidney stones   . Insomnia   . Depression    Review of Systems In addition to the HPI above,  No Fever-chills,  No changes with Vision or hearing,  No problems swallowing food or Liquids,  No Chest pain or productive Cough or Shortness of Breath,  No Abdominal pain, No Nausea or Vomitting, Bowel movements are regular,  No Blood in stool or Urine,  No dysuria,  No new skin rashes or bruises,  No new joints pains-aches,  No new weakness, tingling, numbness in any extremity,  Discussed difficulty of weight loss,   A full 10 point Review of Systems was done, except as stated above, all other Review of Systems were negative  Objective:   Physical Exam BP 126/76  P 80  T 97.9 F   Resp 18  Ht    Wt 242 lb 9.6 oz   BMI 40.37  HEENT - Eac's patent. TM's Nl. EOM's full. PERRLA. NasoOroPharynx clear. Neck - supple. Nl Thyroid. Carotids 2+ & No bruits, nodes, JVD Chest - Clear equal BS w/o Rales, rhonchi, wheezes. Cor - Nl HS. RRR w/o sig MGR. PP 1(+). No edema. MS- FROM w/o deformities. Muscle  power, tone and bulk Nl. Gait Nl. Neuro - No obvious Cr N abnormalities. Sensory, motor and Cerebellar functions appear Nl w/o focal abnormalities. Psyche - Mental status normal & appropriate.  No delusions, ideations or obvious mood abnormalities.  Assessment & Plan:   1. Headache(784.0)  -Sx's Relpax -Rx Sertraline 100 mg #90 x 1 rf - 1/2 tab qd - Rx Atenolol 50 mg #90 x1 rf - take 3-5 days premenstrual and 1 week during  Menses - recc schedule CPE in next couple of months.

## 2014-05-27 NOTE — Patient Instructions (Signed)

## 2014-06-10 ENCOUNTER — Other Ambulatory Visit: Payer: Self-pay | Admitting: Physician Assistant

## 2014-06-10 MED ORDER — ALPRAZOLAM 1 MG PO TABS: ORAL_TABLET | ORAL | Status: DC

## 2014-07-12 DIAGNOSIS — G8929 Other chronic pain: Secondary | ICD-10-CM | POA: Insufficient documentation

## 2014-07-12 DIAGNOSIS — M25569 Pain in unspecified knee: Secondary | ICD-10-CM | POA: Insufficient documentation

## 2014-08-03 ENCOUNTER — Ambulatory Visit: Payer: Self-pay | Admitting: Physician Assistant

## 2014-08-09 ENCOUNTER — Other Ambulatory Visit: Payer: Self-pay | Admitting: Physician Assistant

## 2014-08-09 MED ORDER — ALPRAZOLAM 1 MG PO TABS: ORAL_TABLET | ORAL | Status: DC

## 2014-08-31 ENCOUNTER — Encounter: Payer: Self-pay | Admitting: Physician Assistant

## 2014-08-31 ENCOUNTER — Ambulatory Visit (INDEPENDENT_AMBULATORY_CARE_PROVIDER_SITE_OTHER): Payer: BC Managed Care – PPO | Admitting: Physician Assistant

## 2014-08-31 VITALS — BP 132/78 | HR 88 | Temp 97.9°F | Resp 16 | Wt 238.0 lb

## 2014-08-31 DIAGNOSIS — E559 Vitamin D deficiency, unspecified: Secondary | ICD-10-CM

## 2014-08-31 DIAGNOSIS — G43809 Other migraine, not intractable, without status migrainosus: Secondary | ICD-10-CM

## 2014-08-31 DIAGNOSIS — F419 Anxiety disorder, unspecified: Secondary | ICD-10-CM

## 2014-08-31 DIAGNOSIS — R5383 Other fatigue: Secondary | ICD-10-CM

## 2014-08-31 LAB — BASIC METABOLIC PANEL WITH GFR
BUN: 9 mg/dL (ref 6–23)
CALCIUM: 9.3 mg/dL (ref 8.4–10.5)
CO2: 26 mEq/L (ref 19–32)
CREATININE: 0.61 mg/dL (ref 0.50–1.10)
Chloride: 105 mEq/L (ref 96–112)
Glucose, Bld: 87 mg/dL (ref 70–99)
Potassium: 4.2 mEq/L (ref 3.5–5.3)
Sodium: 141 mEq/L (ref 135–145)

## 2014-08-31 LAB — HEPATIC FUNCTION PANEL
ALK PHOS: 74 U/L (ref 39–117)
ALT: 15 U/L (ref 0–35)
AST: 21 U/L (ref 0–37)
Albumin: 4.1 g/dL (ref 3.5–5.2)
BILIRUBIN INDIRECT: 0.3 mg/dL (ref 0.2–1.2)
Bilirubin, Direct: 0.1 mg/dL (ref 0.0–0.3)
Total Bilirubin: 0.4 mg/dL (ref 0.2–1.2)
Total Protein: 6.6 g/dL (ref 6.0–8.3)

## 2014-08-31 LAB — CBC WITH DIFFERENTIAL/PLATELET
Basophils Absolute: 0 10*3/uL (ref 0.0–0.1)
Basophils Relative: 0 % (ref 0–1)
Eosinophils Absolute: 0.3 10*3/uL (ref 0.0–0.7)
Eosinophils Relative: 4 % (ref 0–5)
HCT: 36.4 % (ref 36.0–46.0)
Hemoglobin: 11.9 g/dL — ABNORMAL LOW (ref 12.0–15.0)
LYMPHS ABS: 1.5 10*3/uL (ref 0.7–4.0)
LYMPHS PCT: 22 % (ref 12–46)
MCH: 27 pg (ref 26.0–34.0)
MCHC: 32.7 g/dL (ref 30.0–36.0)
MCV: 82.5 fL (ref 78.0–100.0)
MONO ABS: 0.4 10*3/uL (ref 0.1–1.0)
MPV: 9.1 fL (ref 8.6–12.4)
Monocytes Relative: 6 % (ref 3–12)
NEUTROS PCT: 68 % (ref 43–77)
Neutro Abs: 4.7 10*3/uL (ref 1.7–7.7)
Platelets: 308 10*3/uL (ref 150–400)
RBC: 4.41 MIL/uL (ref 3.87–5.11)
RDW: 16.1 % — AB (ref 11.5–15.5)
WBC: 6.9 10*3/uL (ref 4.0–10.5)

## 2014-08-31 LAB — IRON AND TIBC
%SAT: 13 % — AB (ref 20–55)
Iron: 49 ug/dL (ref 42–145)
TIBC: 373 ug/dL (ref 250–470)
UIBC: 324 ug/dL (ref 125–400)

## 2014-08-31 LAB — MAGNESIUM: MAGNESIUM: 2 mg/dL (ref 1.5–2.5)

## 2014-08-31 MED ORDER — CLONAZEPAM 0.5 MG PO TABS: 0.5000 mg | ORAL_TABLET | Freq: Every day | ORAL | Status: DC

## 2014-08-31 MED ORDER — PHENTERMINE HCL 37.5 MG PO TABS: 37.5000 mg | ORAL_TABLET | Freq: Every day | ORAL | Status: DC

## 2014-08-31 NOTE — Progress Notes (Signed)
Subjective:    Patient ID: Marie Ingram, female    DOB: [birth year], 43 y.o.   MRN: [id number]  HPI 43 y.o. white female with history of migraines, depression. She states she is very frustrated with her weight. She has a very difficult time with losing weight, states she has gained about 80 lbs in the last 3 years. 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 She tried a friends phentermine and did well with it.   Breakfast: does not eat breakfast Lunch: fast food Dinner: will not get off until 9pm and will stop for fast food.   She also states she is concerned with her xanax use, she is taking 2 xanax to get to sleep when she normally just takes 1. She also states her son has autism, and he has been in private class but will be going to 6th grade and she has been having to make choices, and her daughter is going to college.   Has seen Dr. Berton LanAllusio for left knee pain, got a cortisone injection which has helped but she states at night when she lays down she has "shock" like sensations from knee up.    Current Outpatient Prescriptions on File Prior to Visit  Medication Sig Dispense Refill  . ALPRAZolam (XANAX) 1 MG tablet TAKE 1 TABLET BY MOUTH 3 TIMES A DAY AS NEEDED 90 tablet 1  . atenolol (TENORMIN) 50 MG tablet Take 1 tablet (50 mg total) by mouth daily. For headache prophylaxis 90 tablet 1  . sertraline (ZOLOFT) 100 MG tablet TAKE 1 TABLET BY MOUTH EVERY DAY 90 tablet 1  . SUMAtriptan (IMITREX) 100 MG tablet Take 1 tablet (100 mg total) by mouth every 2 (two) hours as needed for migraine. 10 tablet 1   No current facility-administered medications on file prior to visit.   Past Medical History  Diagnosis Date  . Migraines   . Anxiety   . Kidney stones   . Insomnia   . Depression     Review of Systems   Constitutional: Positive for appetite change (increased hunger), fatigue and unexpected weight change (weight gain). Negative for fever, chills, diaphoresis and activity change.  Eyes: Negative.   Respiratory: Negative.   Cardiovascular: Negative.   Gastrointestinal: Negative.   Endocrine: Negative.   Genitourinary: Negative.   Musculoskeletal: Negative.   Neurological: Positive for headaches. Negative for dizziness, tremors, seizures, syncope, facial asymmetry, speech difficulty, weakness, light-headedness and numbness.  Hematological: Negative.   Psychiatric/Behavioral: Positive for sleep disturbance and dysphoric mood. Negative for suicidal ideas, hallucinations, behavioral problems, confusion, self-injury, decreased concentration and agitation. The patient is not nervous/anxious and is not hyperactive.        Objective:   Physical Exam  Constitutional: She appears well-developed and well-nourished.  HENT:  Head: Normocephalic and atraumatic.  Right Ear: External ear normal.  Nose: Right sinus exhibits no frontal sinus tenderness. Left sinus exhibits no frontal sinus tenderness.  Eyes: Conjunctivae and EOM are normal.  Neck: Normal range of motion. Neck supple.  + TMJ  Cardiovascular: Normal rate, regular rhythm, normal heart sounds and intact distal pulses.   Pulmonary/Chest: Effort normal and breath sounds normal. No respiratory distress. She has no wheezes.  Abdominal: Soft. Bowel sounds are normal.  obese  Lymphadenopathy:    She has no  cervical adenopathy.  Skin: Skin is warm and dry.      Assessment & Plan:  1. Other migraine without status migrainosus, not intractable Remission currently  2. Anxiety/insomnia Stop xanax try klonopin, may help with leg pain - clonazePAM (KLONOPIN) 0.5 MG tablet; Take 1 tablet (0.5 mg total) by mouth at bedtime.  Dispense: 30 tablet; Refill: 0  3. Morbid obesity (BMI 40.37) Obesity with co morbidities- long discussion about weight  loss, diet, and exercise, will start the patient on phentermine- hand out given and AE's discussed, will do close follow up 1 month - Hemoglobin A1c - Insulin, fasting - phentermine (ADIPEX-P) 37.5 MG tablet; Take 1 tablet (37.5 mg total) by mouth daily before breakfast.  Dispense: 30 tablet; Refill: 0  4. Other fatigue - CBC with Differential - BASIC METABOLIC PANEL WITH GFR - Hepatic function panel - TSH - Vitamin B12 - Magnesium - Iron and TIBC - Ferritin  5. Vitamin D deficiency - Vit D  25 hydroxy (rtn osteoporosis monitoring)

## 2014-08-31 NOTE — Patient Instructions (Signed)
Phentermine  While taking the medication we may ask that you come into the office once a month or once every 2-3 months to monitor your weight, blood pressure, and heart rate. In addition we can help answer your questions about diet, exercise, and help you every step of the way with your weight loss journey. Sometime it is helpful if you bring in a food diary or use an app on your phone such as myfitnesspal to record your calorie intake, especially in the beginning.   You can start out on 1/3 to 1/2 a pill in the morning and if you are tolerating it well you can increase to one pill daily. I also have some patients that take 1/3 or 1/2 at lunch to help prevent night time eating.  This medication is cheapest CASH pay at [facility] and you do NOT need a membership to get meds from there.    What is this medicine? PHENTERMINE (FEN ter meen) decreases your appetite. This medicine is intended to be used in addition to a healthy reduced calorie diet and exercise. The best results are achieved this way. This medicine is only indicated for short-term use. Eventually your weight loss may level out and the medication will no longer be needed.   How should I use this medicine? Take this medicine by mouth. Follow the directions on the prescription label. The tablets should stay in the bottle until immediately before you take your dose. Take your doses at regular intervals. Do not take your medicine more often than directed.  Overdosage: If you think you have taken too much of this medicine contact a poison control center or emergency room at once. NOTE: This medicine is only for you. Do not share this medicine with others.  What if I miss a dose? If you miss a dose, take it as soon as you can. If it is almost time for your next dose, take only that dose. Do not take double or extra doses. Do not increase or in any way change your dose without consulting your doctor.  What should I watch for while using  this medicine? Notify your physician immediately if you become short of breath while doing your normal activities. Do not take this medicine within 6 hours of bedtime. It can keep you from getting to sleep. Avoid drinks that contain caffeine and try to stick to a regular bedtime every night. Do not stand or sit up quickly, especially if you are an older patient. This reduces the risk of dizzy or fainting spells. Avoid alcoholic drinks.  What side effects may I notice from receiving this medicine? Side effects that you should report to your doctor or health care professional as soon as possible: -chest pain, palpitations -depression or severe changes in mood -increased blood pressure -irritability -nervousness or restlessness -severe dizziness -shortness of breath -problems urinating -unusual swelling of the legs -vomiting  Side effects that usually do not require medical attention (report to your doctor or health care professional if they continue or are bothersome): -blurred vision or other eye problems -changes in sexual ability or desire -constipation or diarrhea -difficulty sleeping -dry mouth or unpleasant taste -headache -nausea This list may not describe all possible side effects. Call your doctor for medical advice about side effects. You may report side effects to FDA at 1-800-FDA-1088.  We want weight loss that will last so you should lose 1-2 pounds a week.  THAT IS IT! Please pick THREE things a month to  change. Once it is a habit check off the item. Then pick another three items off the list to become habits.  If you are already doing a habit on the list GREAT!  Cross that item off! o Don't drink your calories. Ie, alcohol, soda, fruit juice, and sweet tea.  o Drink more water. Drink a glass when you feel hungry or before each meal.  o Eat breakfast - Complex carb and protein (likeDannon light and fit yogurt, oatmeal, fruit, eggs, [place] bacon). o Measure your cereal.  Eat  no more than one cup a day. (ie [place]) o Eat an apple a day. o Add a vegetable a day. o Try a new vegetable a month. o Use Pam! Stop using oil or butter to cook. o Don't finish your plate or use smaller plates. o Share your dessert. o Eat sugar free Jello for dessert or frozen grapes. o Don't eat 2-3 hours before bed. o Switch to whole wheat bread, pasta, and brown rice. o Make healthier choices when you eat out. No fries! o Pick baked chicken, NOT fried. o Don't forget to SLOW DOWN when you eat. It is not going anywhere.  o Take the stairs. o Park far away in the parking lot o [employer] soup cans (or weights) for 10 minutes while watching TV. o Walk at work for 10 minutes during break. o Walk outside 1 time a week with your friend, kids, dog, or significant other. o Start a walking group at church. o Walk the mall as much as you can tolerate.  o Keep a food diary. o Weigh yourself daily. o Walk for 15 minutes 3 days per week. o Cook at home more often and eat out less.  If life happens and you go back to old habits, it is okay.  Just start over. You can do it!   If you experience chest pain, get short of breath, or tired during the exercise, please stop immediately and inform your doctor.     Bad carbs also include fruit juice, alcohol, and sweet tea. These are empty calories that do not signal to your brain that you are full.   Please remember the good carbs are still carbs which convert into sugar. So please measure them out no more than 1/2-1 cup of rice, oatmeal, pasta, and beans  Veggies are however free foods! Pile them on.   Not all fruit is created equal. Please see the list below, the fruit at the bottom is higher in sugars than the fruit at the top. Please avoid all dried fruits.

## 2014-09-01 LAB — HEMOGLOBIN A1C
Hgb A1c MFr Bld: 5.5 % (ref <5.7)
Mean Plasma Glucose: 111 mg/dL (ref <117)

## 2014-09-01 LAB — VITAMIN B12: Vitamin B-12: 564 pg/mL (ref 211–911)

## 2014-09-01 LAB — VITAMIN D 25 HYDROXY (VIT D DEFICIENCY, FRACTURES): Vit D, 25-Hydroxy: 22 ng/mL — ABNORMAL LOW (ref 30–100)

## 2014-09-01 LAB — FERRITIN: FERRITIN: 50 ng/mL (ref 10–291)

## 2014-09-01 LAB — TSH: TSH: 1.319 u[IU]/mL (ref 0.350–4.500)

## 2014-09-01 LAB — INSULIN, FASTING: Insulin fasting, serum: 12.3 u[IU]/mL (ref 2.0–19.6)

## 2014-09-15 ENCOUNTER — Other Ambulatory Visit: Payer: Self-pay | Admitting: Physician Assistant

## 2014-09-15 MED ORDER — ALPRAZOLAM 1 MG PO TABS: ORAL_TABLET | ORAL | Status: DC

## 2014-10-05 ENCOUNTER — Ambulatory Visit (INDEPENDENT_AMBULATORY_CARE_PROVIDER_SITE_OTHER): Payer: BLUE CROSS/BLUE SHIELD | Admitting: Physician Assistant

## 2014-10-05 ENCOUNTER — Encounter: Payer: Self-pay | Admitting: Physician Assistant

## 2014-10-05 MED ORDER — PHENTERMINE HCL 37.5 MG PO TABS: 37.5000 mg | ORAL_TABLET | Freq: Every day | ORAL | Status: DC

## 2014-10-05 MED ORDER — TRAZODONE HCL 150 MG PO TABS: 150.0000 mg | ORAL_TABLET | Freq: Every day | ORAL | Status: DC

## 2014-10-05 NOTE — Progress Notes (Signed)
43 y.o.female presents for a follow up after being on phentermine for weight loss for 1 month. Patient states they have improved meal pattern, less frequent dining out and better food choices. While on the phentermine they have lost 8 lbs since last visit. They deny palpitations, anxiety, trouble sleeping, elevated BP.   She continues to have problems with sleeping, with xanax/klonopin she will get to sleep but not stay asleep. She is also starting a new job Monday working at [employer] normal hours 8-5 and is very stressed because she needs to sleep.   Wt Readings from Last 3 Encounters:  10/05/14 230 lb (104.327 kg)  08/31/14 238 lb (107.956 kg)  05/27/14 242 lb 9.6 oz (110.043 kg)    Typical breakfast: [place] yogurt Typical lunch: trying to eat salads Typical dinner: lean cuisine  Medications: Current Outpatient Prescriptions on File Prior to Visit  Medication Sig Dispense Refill  . ALPRAZolam (XANAX) 1 MG tablet TAKE 1 TABLET BY MOUTH 3 TIMES A DAY AS NEEDED 90 tablet 1  . phentermine (ADIPEX-P) 37.5 MG tablet Take 1 tablet (37.5 mg total) by mouth daily before breakfast. 30 tablet 0  . sertraline (ZOLOFT) 100 MG tablet TAKE 1 TABLET BY MOUTH EVERY DAY 90 tablet 1  . SUMAtriptan (IMITREX) 100 MG tablet Take 1 tablet (100 mg total) by mouth every 2 (two) hours as needed for migraine. 10 tablet 1   No current facility-administered medications on file prior to visit.    ROS: All negative except for above  Physical exam: Filed Vitals:   10/05/14 1133  BP: 128/80  Pulse: 76  Temp: 97.7 F (36.5 C)  Resp: 16   BP 128/80 mmHg  Pulse 76  Temp(Src) 97.7 F (36.5 C)  Resp 16  Ht 5\' 5"  (1.651 m)  Wt 230 lb (104.327 kg)  BMI 38.27 kg/m2 General appearance: alert and mildly obese Lungs: clear to auscultation bilaterally Heart: regular rate and rhythm, S1, S2 normal, no murmur, click, rub or gallop Abdomen: soft, non-tender; bowel sounds normal; no masses,  no  organomegaly Extremities: extremities normal, atraumatic, no cyanosis or edema  Assessment: Obesity with co morbid conditions.  Insomnia  Plan: General weight loss/lifestyle modification strategies discussed (elicit support from others; identify saboteurs; non-food rewards, etc). Informal exercise measures discussed, e.g. taking stairs instead of elevator. Regular aerobic exercise program discussed. Medication: phentermine. Follow up in 2 months and as needed.  Insomnia- will do trazodone at night, and with new insurance with new job may switch to lunesta/belsomra.

## 2014-10-05 NOTE — Patient Instructions (Signed)
Orka steamer [employer]on Drink 64 oz a day   Before you even begin to attack a weight-loss plan, it pays to remember this: You are not fat. You have fat. Losing weight isn't about blame or shame; it's simply another achievement to accomplish. Dieting is like any other skill-you have to buckle down and work at it. As long as you act in a smart, reasonable way, you'll ultimately get where you want to be. Here are some weight loss pearls for you.  1. It's Not a Diet. It's a Lifestyle Thinking of a diet as something you're on and suffering through only for the short term doesn't work. To shed weight and keep it off, you need to make permanent changes to the way you eat. It's OK to indulge occasionally, of course, but if you cut calories temporarily and then revert to your old way of eating, you'll gain back the weight quicker than you can say yo-yo. Use it to lose it. Research shows that one of the best predictors of long-term weight loss is how many pounds you drop in the first month. For that reason, nutritionists often suggest being stricter for the first two weeks of your new eating strategy to build momentum. Cut out added sugar and alcohol and avoid unrefined carbs. After that, figure out how you can reincorporate them in a way that's healthy and maintainable.  2. There's a Right Way to Exercise Working out burns calories and fat and boosts your metabolism by building muscle. But those trying to lose weight are notorious for overestimating the number of calories they burn and underestimating the amount they take in. Unfortunately, your system is biologically programmed to hold on to extra pounds and that means when you start exercising, your body senses the deficit and ramps up its hunger signals. If you're not diligent, you'll eat everything you burn and then some. Use it to lose it. Cardio gets all the exercise glory, but strength and interval training are the real heroes. They help you build lean muscle,  which in turn increases your metabolism and calorie-burning ability 3. Don't Overreact to Mild Hunger Some people have a hard time losing weight because of hunger anxiety. To them, being hungry is bad-something to be avoided at all costs-so they carry snacks with them and eat when they don't need to. Others eat because they're stressed out or bored. While you never want to get to the point of being ravenous (that's when bingeing is likely to happen), a hunger pang, a craving, or the fact that it's 3:00 p.m. should not send you racing for the vending machine or obsessing about the energy bar in your purse. Ideally, you should put off eating until your stomach is growling and it's difficult to concentrate.  Use it to lose it. When you feel the urge to eat, use the HALT method. Ask yourself, Am I really hungry? Or am I angry or anxious, lonely or bored, or tired? If you're still not certain, try the apple test. If you're truly hungry, an apple should seem delicious; if it doesn't, something else is going on. Or you can try drinking water and making yourself busy, if you are still hungry try a healthy snack.  4. Not All Calories Are Created Equal The mechanics of weight loss are pretty simple: Take in fewer calories than you use for energy. But the kind of food you eat makes all the difference. Processed food that's high in saturated fat and refined starch or sugar  can cause inflammation that disrupts the hormone signals that tell your brain you're full. The result: You eat a lot more.  Use it to lose it. Clean up your diet. Swap in whole, unprocessed foods, including vegetables, lean protein, and healthy fats that will fill you up and give you the biggest nutritional bang for your calorie buck. In a few weeks, as your brain starts receiving regular hunger and fullness signals once again, you'll notice that you feel less hungry overall and naturally start cutting back on the amount you eat.  5. Protein, Produce,  and Plant-Based Fats Are Your Weight-Loss Trinity Here's why eating the three Ps regularly will help you drop pounds. Protein fills you up. You need it to build lean muscle, which keeps your metabolism humming so that you can torch more fat. People in a weight-loss program who ate double the recommended daily allowance for protein (about 110 grams for a 150-pound woman) lost 70 percent of their weight from fat, while people who ate the RDA lost only about 40 percent, one study found. Produce is packed with filling fiber. "It's very difficult to consume too many calories if you're eating a lot of vegetables. Example: Three cups of broccoli is a lot of food, yet only 93 calories. (Fruit is another story. It can be easy to overeat and can contain a lot of calories from sugar, so be sure to monitor your intake.) Plant-based fats like olive oil and those in avocados and nuts are healthy and extra satiating.  Use it to lose it. Aim to incorporate each of the three Ps into every meal and snack. People who eat protein throughout the day are able to keep weight off, according to a study in the American Journal of Clinical Nutrition. In addition to meat, poultry and seafood, good sources are beans, lentils, eggs, tofu, and yogurt. As for fat, keep portion sizes in check by measuring out salad dressing, oil, and nut butters (shoot for one to two tablespoons). Finally, eat veggies or a little fruit at every meal. People who did that consumed 308 fewer calories but didn't feel any hungrier than when they didn't eat more produce.  7. How You Eat Is As Important As What You Eat In order for your brain to register that you're full, you need to focus on what you're eating. Sit down whenever you eat, preferably at a table. Turn off the TV or computer, put down your phone, and look at your food. Smell it. Chew slowly, and don't put another bite on your fork until you swallow. When women ate lunch this attentively, they  consumed 30 percent less when snacking later than those who listened to an audiobook at lunchtime, according to a study in the [place] Journal of Nutrition. 8. Weighing Yourself Really Works The scale provides the best evidence about whether your efforts are paying off. Seeing the numbers tick up or down or stagnate is motivation to keep going-or to rethink your approach. A 2015 study at [facility] found that daily weigh-ins helped people lose more weight, keep it off, and maintain that loss, even after two years. Use it to lose it. Step on the scale at the same time every day for the best results. If your weight shoots up several pounds from one weigh-in to the next, don't freak out. Eating a lot of salt the night before or having your period is the likely culprit. The number should return to normal in a day or two. It's  a steady climb that you need to do something about. 9. Too Much Stress and Too Little Sleep Are Your Enemies When you're tired and frazzled, your body cranks up the production of cortisol, the stress hormone that can cause carb cravings. Not getting enough sleep also boosts your levels of ghrelin, a hormone associated with hunger, while suppressing leptin, a hormone that signals fullness and satiety. People on a diet who slept only five and a half hours a night for two weeks lost 55 percent less fat and were hungrier than those who slept eight and a half hours, according to a study in the [place] Medical Association Journal. Use it to lose it. Prioritize sleep, aiming for seven hours or more a night, which research shows helps lower stress. And make sure you're getting quality zzz's. If a snoring spouse or a fidgety cat wakes you up frequently throughout the night, you may end up getting the equivalent of just four hours of sleep, according to a study from [facility]. Keep pets out of the bedroom, and use a white-noise app to drown out snoring. 10. You Will Hit a  plateau-And You Can Bust Through It As you slim down, your body releases much less leptin, the fullness hormone.  If you're not strength training, start right now. Building muscle can raise your metabolism to help you overcome a plateau. To keep your body challenged and burning calories, incorporate new moves and more intense intervals into your workouts or add another sweat session to your weekly routine. Alternatively, cut an extra 100 calories or so a day from your diet. Now that you've lost weight, your body simply doesn't need as much fuel.   Ways to cut 100 calories  1. Eat your eggs with hot sauce OR salsa instead of cheese.  Eggs are great for breakfast, but many people consider eggs and cheese to be BFFs. Instead of cheese-1 oz. of cheddar has 114 calories-top your eggs with hot sauce, which contains no calories and helps with satiety and metabolism. Salsa is also a great option!!  2. Top your toast, waffles or pancakes with mashed berries instead of jelly or syrup. Half a cup of berries-fresh, frozen or thawed-has about 40 calories, compared with 2 tbsp. of maple syrup or jelly, which both have about 100 calories. The berries will also give you a good punch of fiber, which helps keep you full and satisfied and won't spike blood sugar quickly like the jelly or syrup. 3. Swap the non-fat latte for black coffee with a splash of half-and-half. Contrary to its name, that non-fat latte has 130 calories and a startling 19g of carbohydrates per 16 oz. serving. Replacing that 'light' drinkable dessert with a black coffee with a splash of half-and-half saves you more than 100 calories per 16 oz. serving. 4. Sprinkle salads with freeze-dried raspberries instead of dried cranberries. If you want a sweet addition to your nutritious salad, stay away from dried cranberries. They have a whopping 130 calories per  cup and 30g carbohydrates. Instead, sprinkle freeze-dried raspberries guilt-free and save more  than 100 calories per  cup serving, adding 3g of belly-filling fiber. 5. Go for mustard in place of mayo on your sandwich. Mustard can add really nice flavor to any sandwich, and there are tons of varieties, from spicy to honey. A serving of mayo is 95 calories, versus 10 calories in a serving of mustard. 6. Choose a DIY salad dressing instead of the store-bought kind. Mix Dijon or  whole grain mustard with low-fat Kefir or red wine vinegar and garlic. 7. Use hummus as a spread instead of a dip. Use hummus as a spread on a high-fiber cracker or tortilla with a sandwich and save on calories without sacrificing taste. 8. Pick just one salad "accessory." Salad isn't automatically a calorie winner. It's easy to over-accessorize with toppings. Instead of topping your salad with nuts, avocado and cranberries (all three will clock in at 313 calories), just pick one. The next day, choose a different accessory, which will also keep your salad interesting. You don't wear all your jewelry every day, right? 9. Ditch the white pasta in favor of spaghetti squash. One cup of cooked spaghetti squash has about 40 calories, compared with traditional spaghetti, which comes with more than 200. Spaghetti squash is also nutrient-dense. It's a good source of fiber and Vitamins A and C, and it can be eaten just like you would eat pasta-with a great tomato sauce and [place] meatballs or with pesto, tofu and spinach, for example. 10. Dress up your chili, soups and stews with non-fat [place] yogurt instead of sour cream. Just a 'dollop' of sour cream can set you back 115 calories and a whopping 12g of fat-seven of which are of the artery-clogging variety. Added bonus: [place] yogurt is packed with muscle-building protein, calcium and B Vitamins. 11. Mash cauliflower instead of mashed potatoes. One cup of traditional mashed potatoes-in all their creamy goodness-has more than 200 calories, compared to mashed cauliflower, which you can  typically eat for less than 100 calories per 1 cup serving. Cauliflower is a great source of the antioxidant indole-3-carbinol (I3C), which may help reduce the risk of some cancers, like breast cancer. 12. Ditch the ice cream sundae in favor of a [place] yogurt parfait. Instead of a cup of ice cream or fro-yo for dessert, try 1 cup of nonfat Greek yogurt topped with fresh berries and a sprinkle of cacao nibs. Both toppings are packed with antioxidants, which can help reduce cellular inflammation and oxidative damage. And the comparison is a no-brainer: One cup of ice cream has about 275 calories; one cup of frozen yogurt has about 230; and a cup of Greek yogurt has just 130, plus twice the protein, so you're less likely to return to the freezer for a second helping. 13. Put olive oil in a spray container instead of using it directly from the bottle. Each tablespoon of olive oil is 120 calories and 15g of fat. Use a mister instead of pouring it straight into the pan or onto a salad. This allows for portion control and will save you more than 100 calories. 14. When baking, substitute canned pumpkin for butter or oil. Canned pumpkin-not pumpkin pie mix-is loaded with Vitamin A, which is important for skin and eye health, as well as immunity. And the comparisons are pretty crazy:  cup of canned pumpkin has about 40 calories, compared to butter or oil, which has more than 800 calories. Yes, 800 calories. Applesauce and mashed banana can also serve as good substitutions for butter or oil, usually in a 1:1 ratio. 15. Top casseroles with high-fiber cereal instead of breadcrumbs. Breadcrumbs are typically made with white bread, while breakfast cereals contain 5-9g of fiber per serving. Not only will you save more than 150 calories per  cup serving, the swap will also keep you more full and you'll get a metabolism boost from the added fiber. 16. Snack on pistachios instead of macadamia nuts. Believe it or  not, you  get the same amount of calories from 35 pistachios (100 calories) as you would from only five macadamia nuts. 17. Chow down on kale chips rather than potato chips. This is my favorite 'don't knock it 'till you try it' swap. Kale chips are so easy to make at home, and you can spice them up with a little grated parmesan or chili powder. Plus, they're a mere fraction of the calories of potato chips, but with the same crunch factor we crave so often. 18. Add seltzer and some fruit slices to your cocktail instead of soda or fruit juice. One cup of soda or fruit juice can pack on as much as 140 calories. Instead, use seltzer and fruit slices. The fruit provides valuable phytochemicals, such as flavonoids and anthocyanins, which help to combat cancer and stave off the aging process.

## 2014-11-18 ENCOUNTER — Other Ambulatory Visit: Payer: Self-pay | Admitting: Physician Assistant

## 2014-11-18 MED ORDER — ALPRAZOLAM 1 MG PO TABS: ORAL_TABLET | ORAL | Status: DC

## 2014-11-21 ENCOUNTER — Other Ambulatory Visit: Payer: Self-pay | Admitting: Physician Assistant

## 2014-11-21 MED ORDER — SERTRALINE HCL 100 MG PO TABS: ORAL_TABLET | ORAL | Status: DC

## 2014-11-21 MED ORDER — ALPRAZOLAM 1 MG PO TABS: ORAL_TABLET | ORAL | Status: DC

## 2014-12-22 ENCOUNTER — Ambulatory Visit: Payer: Self-pay | Admitting: Physician Assistant

## 2015-01-20 ENCOUNTER — Other Ambulatory Visit: Payer: Self-pay | Admitting: Physician Assistant

## 2015-01-26 ENCOUNTER — Ambulatory Visit (INDEPENDENT_AMBULATORY_CARE_PROVIDER_SITE_OTHER): Payer: 59 | Admitting: Physician Assistant

## 2015-01-26 ENCOUNTER — Encounter: Payer: Self-pay | Admitting: Physician Assistant

## 2015-01-26 DIAGNOSIS — R10814 Left lower quadrant abdominal tenderness: Secondary | ICD-10-CM

## 2015-01-26 DIAGNOSIS — E559 Vitamin D deficiency, unspecified: Secondary | ICD-10-CM

## 2015-01-26 DIAGNOSIS — R197 Diarrhea, unspecified: Secondary | ICD-10-CM

## 2015-01-26 DIAGNOSIS — R5383 Other fatigue: Secondary | ICD-10-CM

## 2015-01-26 DIAGNOSIS — M545 Low back pain, unspecified: Secondary | ICD-10-CM

## 2015-01-26 DIAGNOSIS — F419 Anxiety disorder, unspecified: Secondary | ICD-10-CM

## 2015-01-26 LAB — CBC WITH DIFFERENTIAL/PLATELET
Basophils Absolute: 0 10*3/uL (ref 0.0–0.1)
Basophils Relative: 0 % (ref 0–1)
EOS ABS: 0.3 10*3/uL (ref 0.0–0.7)
Eosinophils Relative: 3 % (ref 0–5)
HCT: 37.5 % (ref 36.0–46.0)
Hemoglobin: 12 g/dL (ref 12.0–15.0)
LYMPHS ABS: 2 10*3/uL (ref 0.7–4.0)
Lymphocytes Relative: 20 % (ref 12–46)
MCH: 26.8 pg (ref 26.0–34.0)
MCHC: 32 g/dL (ref 30.0–36.0)
MCV: 83.7 fL (ref 78.0–100.0)
MONO ABS: 0.7 10*3/uL (ref 0.1–1.0)
MONOS PCT: 7 % (ref 3–12)
MPV: 9.4 fL (ref 8.6–12.4)
NEUTROS ABS: 6.9 10*3/uL (ref 1.7–7.7)
Neutrophils Relative %: 70 % (ref 43–77)
Platelets: 350 10*3/uL (ref 150–400)
RBC: 4.48 MIL/uL (ref 3.87–5.11)
RDW: 15.3 % (ref 11.5–15.5)
WBC: 9.9 10*3/uL (ref 4.0–10.5)

## 2015-01-26 MED ORDER — RANITIDINE HCL 300 MG PO TABS: ORAL_TABLET | ORAL | Status: DC

## 2015-01-26 MED ORDER — HYOSCYAMINE SULFATE 0.125 MG PO TABS: 0.1250 mg | ORAL_TABLET | ORAL | Status: DC | PRN

## 2015-01-26 MED ORDER — MELOXICAM 15 MG PO TABS: ORAL_TABLET | ORAL | Status: DC

## 2015-01-26 NOTE — Patient Instructions (Signed)
Before you even begin to attack a weight-loss plan, it pays to remember this: You are not fat. You have fat. Losing weight isn't about blame or shame; it's simply another achievement to accomplish. Dieting is like any other skill-you have to buckle down and work at it. As long as you act in a smart, reasonable way, you'll ultimately get where you want to be. Here are some weight loss pearls for you.  1. It's Not a Diet. It's a Lifestyle Thinking of a diet as something you're on and suffering through only for the short term doesn't work. To shed weight and keep it off, you need to make permanent changes to the way you eat. It's OK to indulge occasionally, of course, but if you cut calories temporarily and then revert to your old way of eating, you'll gain back the weight quicker than you can say yo-yo. Use it to lose it. Research shows that one of the best predictors of long-term weight loss is how many pounds you drop in the first month. For that reason, nutritionists often suggest being stricter for the first two weeks of your new eating strategy to build momentum. Cut out added sugar and alcohol and avoid unrefined carbs. After that, figure out how you can reincorporate them in a way that's healthy and maintainable.  2. There's a Right Way to Exercise Working out burns calories and fat and boosts your metabolism by building muscle. But those trying to lose weight are notorious for overestimating the number of calories they burn and underestimating the amount they take in. Unfortunately, your system is biologically programmed to hold on to extra pounds and that means when you start exercising, your body senses the deficit and ramps up its hunger signals. If you're not diligent, you'll eat everything you burn and then some. Use it to lose it. Cardio gets all the exercise glory, but strength and interval training are the real heroes. They help you build lean muscle, which in turn increases your metabolism and  calorie-burning ability 3. Don't Overreact to Mild Hunger Some people have a hard time losing weight because of hunger anxiety. To them, being hungry is bad-something to be avoided at all costs-so they carry snacks with them and eat when they don't need to. Others eat because they're stressed out or bored. While you never want to get to the point of being ravenous (that's when bingeing is likely to happen), a hunger pang, a craving, or the fact that it's 3:00 p.m. should not send you racing for the vending machine or obsessing about the energy bar in your purse. Ideally, you should put off eating until your stomach is growling and it's difficult to concentrate.  Use it to lose it. When you feel the urge to eat, use the HALT method. Ask yourself, Am I really hungry? Or am I angry or anxious, lonely or bored, or tired? If you're still not certain, try the apple test. If you're truly hungry, an apple should seem delicious; if it doesn't, something else is going on. Or you can try drinking water and making yourself busy, if you are still hungry try a healthy snack.  4. Not All Calories Are Created Equal The mechanics of weight loss are pretty simple: Take in fewer calories than you use for energy. But the kind of food you eat makes all the difference. Processed food that's high in saturated fat and refined starch or sugar can cause inflammation that disrupts the hormone signals that tell  your brain you're full. The result: You eat a lot more.  Use it to lose it. Clean up your diet. Swap in whole, unprocessed foods, including vegetables, lean protein, and healthy fats that will fill you up and give you the biggest nutritional bang for your calorie buck. In a few weeks, as your brain starts receiving regular hunger and fullness signals once again, you'll notice that you feel less hungry overall and naturally start cutting back on the amount you eat.  5. Protein, Produce, and Plant-Based Fats Are Your Weight-Loss  Marie Ingram Here's why eating the three Ps regularly will help you drop pounds. Protein fills you up. You need it to build lean muscle, which keeps your metabolism humming so that you can torch more fat. People in a weight-loss program who ate double the recommended daily allowance for protein (about 110 grams for a 150-pound woman) lost 70 percent of their weight from fat, while people who ate the RDA lost only about 40 percent, one study found. Produce is packed with filling fiber. "It's very difficult to consume too many calories if you're eating a lot of vegetables. Example: Three cups of broccoli is a lot of food, yet only 93 calories. (Fruit is another story. It can be easy to overeat and can contain a lot of calories from sugar, so be sure to monitor your intake.) Plant-based fats like olive oil and those in avocados and nuts are healthy and extra satiating.  Use it to lose it. Aim to incorporate each of the three Ps into every meal and snack. People who eat protein throughout the day are able to keep weight off, according to a study in the American Journal of Clinical Nutrition. In addition to meat, poultry and seafood, good sources are beans, lentils, eggs, tofu, and yogurt. As for fat, keep portion sizes in check by measuring out salad dressing, oil, and nut butters (shoot for one to two tablespoons). Finally, eat veggies or a little fruit at every meal. People who did that consumed 308 fewer calories but didn't feel any hungrier than when they didn't eat more produce.  7. How You Eat Is As Important As What You Eat In order for your brain to register that you're full, you need to focus on what you're eating. Sit down whenever you eat, preferably at a table. Turn off the TV or computer, put down your phone, and look at your food. Smell it. Chew slowly, and don't put another bite on your fork until you swallow. When women ate lunch this attentively, they consumed 30 percent less when snacking later than  those who listened to an audiobook at lunchtime, according to a study in the British Journal of Nutrition. 8. Weighing Yourself Really Works The scale provides the best evidence about whether your efforts are paying off. Seeing the numbers tick up or down or stagnate is motivation to keep going-or to rethink your approach. A 2015 study at [employer] found that daily weigh-ins helped people lose more weight, keep it off, and maintain that loss, even after two years. Use it to lose it. Step on the scale at the same time every day for the best results. If your weight shoots up several pounds from one weigh-in to the next, don't freak out. Eating a lot of salt the night before or having your period is the likely culprit. The number should return to normal in a day or two. It's a steady climb that you need to do something about.   9. Too Much Stress and Too Little Sleep Are Your Enemies When you're tired and frazzled, your body cranks up the production of cortisol, the stress hormone that can cause carb cravings. Not getting enough sleep also boosts your levels of ghrelin, a hormone associated with hunger, while suppressing leptin, a hormone that signals fullness and satiety. People on a diet who slept only five and a half hours a night for two weeks lost 55 percent less fat and were hungrier than those who slept eight and a half hours, according to a study in the [employer] Journal. Use it to lose it. Prioritize sleep, aiming for seven hours or more a night, which research shows helps lower stress. And make sure you're getting quality zzz's. If a snoring spouse or a fidgety cat wakes you up frequently throughout the night, you may end up getting the equivalent of just four hours of sleep, according to a study from [employer]. Keep pets out of the bedroom, and use a white-noise app to drown out snoring. 10. You Will Hit a plateau-And You Can Bust Through It As you slim down, your  body releases much less leptin, the fullness hormone.  If you're not strength training, start right now. Building muscle can raise your metabolism to help you overcome a plateau. To keep your body challenged and burning calories, incorporate new moves and more intense intervals into your workouts or add another sweat session to your weekly routine. Alternatively, cut an extra 100 calories or so a day from your diet. Now that you've lost weight, your body simply doesn't need as much fuel.   Ways to cut 100 calories  1. Eat your eggs with hot sauce OR salsa instead of cheese.  Eggs are great for breakfast, but many people consider eggs and cheese to be BFFs. Instead of cheese-1 oz. of cheddar has 114 calories-top your eggs with hot sauce, which contains no calories and helps with satiety and metabolism. Salsa is also a great option!!  2. Top your toast, waffles or pancakes with mashed berries instead of jelly or syrup. Half a cup of berries-fresh, frozen or thawed-has about 40 calories, compared with 2 tbsp. of maple syrup or jelly, which both have about 100 calories. The berries will also give you a good punch of fiber, which helps keep you full and satisfied and won't spike blood sugar quickly like the jelly or syrup. 3. Swap the non-fat latte for black coffee with a splash of half-and-half. Contrary to its name, that non-fat latte has 130 calories and a startling 19g of carbohydrates per 16 oz. serving. Replacing that 'light' drinkable dessert with a black coffee with a splash of half-and-half saves you more than 100 calories per 16 oz. serving. 4. Sprinkle salads with freeze-dried raspberries instead of dried cranberries. If you want a sweet addition to your nutritious salad, stay away from dried cranberries. They have a whopping 130 calories per  cup and 30g carbohydrates. Instead, sprinkle freeze-dried raspberries guilt-free and save more than 100 calories per  cup serving, adding 3g of belly-filling  fiber. 5. Go for mustard in place of mayo on your sandwich. Mustard can add really nice flavor to any sandwich, and there are tons of varieties, from spicy to honey. A serving of mayo is 95 calories, versus 10 calories in a serving of mustard. 6. Choose a DIY salad dressing instead of the store-bought kind. Mix Dijon or whole grain mustard with low-fat Kefir or red wine vinegar   and garlic. 7. Use hummus as a spread instead of a dip. Use hummus as a spread on a high-fiber cracker or tortilla with a sandwich and save on calories without sacrificing taste. 8. Pick just one salad "accessory." Salad isn't automatically a calorie winner. It's easy to over-accessorize with toppings. Instead of topping your salad with nuts, avocado and cranberries (all three will clock in at 313 calories), just pick one. The next day, choose a different accessory, which will also keep your salad interesting. You don't wear all your jewelry every day, right? 9. Ditch the white pasta in favor of spaghetti squash. One cup of cooked spaghetti squash has about 40 calories, compared with traditional spaghetti, which comes with more than 200. Spaghetti squash is also nutrient-dense. It's a good source of fiber and Vitamins A and C, and it can be eaten just like you would eat pasta-with a great tomato sauce and [place] meatballs or with pesto, tofu and spinach, for example. 10. Dress up your chili, soups and stews with non-fat [place] yogurt instead of sour cream. Just a 'dollop' of sour cream can set you back 115 calories and a whopping 12g of fat-seven of which are of the artery-clogging variety. Added bonus: [place] yogurt is packed with muscle-building protein, calcium and B Vitamins. 11. Mash cauliflower instead of mashed potatoes. One cup of traditional mashed potatoes-in all their creamy goodness-has more than 200 calories, compared to mashed cauliflower, which you can typically eat for less than 100 calories per 1 cup serving.  Cauliflower is a great source of the antioxidant indole-3-carbinol (I3C), which may help reduce the risk of some cancers, like breast cancer. 12. Ditch the ice cream sundae in favor of a [place] yogurt parfait. Instead of a cup of ice cream or fro-yo for dessert, try 1 cup of nonfat Greek yogurt topped with fresh berries and a sprinkle of cacao nibs. Both toppings are packed with antioxidants, which can help reduce cellular inflammation and oxidative damage. And the comparison is a no-brainer: One cup of ice cream has about 275 calories; one cup of frozen yogurt has about 230; and a cup of Greek yogurt has just 130, plus twice the protein, so you're less likely to return to the freezer for a second helping. 13. Put olive oil in a spray container instead of using it directly from the bottle. Each tablespoon of olive oil is 120 calories and 15g of fat. Use a mister instead of pouring it straight into the pan or onto a salad. This allows for portion control and will save you more than 100 calories. 14. When baking, substitute canned pumpkin for butter or oil. Canned pumpkin-not pumpkin pie mix-is loaded with Vitamin A, which is important for skin and eye health, as well as immunity. And the comparisons are pretty crazy:  cup of canned pumpkin has about 40 calories, compared to butter or oil, which has more than 800 calories. Yes, 800 calories. Applesauce and mashed banana can also serve as good substitutions for butter or oil, usually in a 1:1 ratio. 15. Top casseroles with high-fiber cereal instead of breadcrumbs. Breadcrumbs are typically made with white bread, while breakfast cereals contain 5-9g of fiber per serving. Not only will you save more than 150 calories per  cup serving, the swap will also keep you more full and you'll get a metabolism boost from the added fiber. 16. Snack on pistachios instead of macadamia nuts. Believe it or not, you get the same amount of calories from 35  pistachios (100  calories) as you would from only five macadamia nuts. 17. Chow down on kale chips rather than potato chips. This is my favorite 'don't knock it 'till you try it' swap. Kale chips are so easy to make at home, and you can spice them up with a little grated parmesan or chili powder. Plus, they're a mere fraction of the calories of potato chips, but with the same crunch factor we crave so often. 18. Add seltzer and some fruit slices to your cocktail instead of soda or fruit juice. One cup of soda or fruit juice can pack on as much as 140 calories. Instead, use seltzer and fruit slices. The fruit provides valuable phytochemicals, such as flavonoids and anthocyanins, which help to combat cancer and stave off the aging process.    Irritable Bowel Syndrome Irritable bowel syndrome (IBS) is caused by a disturbance of normal bowel function and is a common digestive disorder. You may also hear this condition called spastic colon, mucous colitis, and irritable colon. There is no cure for IBS. However, symptoms often gradually improve or disappear with a good diet, stress management, and medicine. This condition usually appears in late adolescence or early adulthood. Women develop it twice as often as men. CAUSES  After food has been digested and absorbed in the small intestine, waste material is moved into the large intestine, or colon. In the colon, water and salts are absorbed from the undigested products coming from the small intestine. The remaining residue, or fecal material, is held for elimination. Under normal circumstances, gentle, rhythmic contractions of the bowel walls push the fecal material along the colon toward the rectum. In IBS, however, these contractions are irregular and poorly coordinated. The fecal material is either retained too long, resulting in constipation, or expelled too soon, producing diarrhea. SIGNS AND SYMPTOMS  The most common symptom of IBS is abdominal pain. It is often in the  lower left side of the abdomen, but it may occur anywhere in the abdomen. The pain comes from spasms of the bowel muscles happening too much and from the buildup of gas and fecal material in the colon. This pain:  Can range from sharp abdominal cramps to a dull, continuous ache.  Often worsens soon after eating.  Is often relieved by having a bowel movement or passing gas. Abdominal pain is usually accompanied by constipation, but it may also produce diarrhea. The diarrhea often occurs right after a meal or upon waking up in the morning. The stools are often soft, watery, and flecked with mucus. Other symptoms of IBS include:  Bloating.  Loss of appetite.  Heartburn.  Backache.  Dull pain in the arms or shoulders.  Nausea.  Burping.  Vomiting.  Gas. IBS may also cause symptoms that are unrelated to the digestive system, such as:  Fatigue.  Headaches.  Anxiety.  Shortness of breath.  Trouble concentrating.  Dizziness. These symptoms tend to come and go. DIAGNOSIS  The symptoms of IBS may seem like symptoms of other, more serious digestive disorders. Your health care provider may want to perform tests to exclude these disorders.  TREATMENT Many medicines are available to help correct bowel function or relieve bowel spasms and abdominal pain. Among the medicines available are:  Laxatives for severe constipation and to help restore normal bowel habits.  Specific antidiarrheal medicines to treat severe or lasting diarrhea.  Antispasmodic agents to relieve intestinal cramps. Your health care provider may also decide to treat you with a mild tranquilizer  or sedative during unusually stressful periods in your life. Your health care provider may also prescribe antidepressant medicine. The use of this medicine has been shown to reduce pain and other symptoms of IBS. Remember that if any medicine is prescribed for you, you should take it exactly as directed. Make sure your  health care provider knows how well it worked for you. HOME CARE INSTRUCTIONS   Take all medicines as directed by your health care provider.  Avoid foods that are high in fat or oils, such as heavy cream, butter, frankfurters, sausage, and other fatty meats.  Avoid foods that make you go to the bathroom, such as fruit, fruit juice, and dairy products.  Cut out carbonated drinks, chewing gum, and "gassy" foods such as beans and cabbage. This may help relieve bloating and burping.  Eat foods with bran, and drink plenty of liquids with the bran foods. This helps relieve constipation.  Keep track of what foods seem to bring on your symptoms.  Avoid emotionally charged situations or circumstances that produce anxiety.  Start or continue exercising.  Get plenty of rest and sleep. Document Released: 08/19/2005 Document Revised: 08/24/2013 Document Reviewed: 04/08/2008 [facility] Patient Information 2015 [place], [place]. This information is not intended to replace advice given to you by your health care provider. Make sure you discuss any questions you have with your health care provider.   Food Choices for Gastroesophageal Reflux Disease When you have gastroesophageal reflux disease (GERD), the foods you eat and your eating habits are very important. Choosing the right foods can help ease the discomfort of GERD. WHAT GENERAL GUIDELINES DO I NEED TO FOLLOW?  Choose fruits, vegetables, whole grains, low-fat dairy products, and low-fat meat, fish, and poultry.  Limit fats such as oils, salad dressings, butter, nuts, and avocado.  Keep a food diary to identify foods that cause symptoms.  Avoid foods that cause reflux. These may be different for different people.  Eat frequent small meals instead of three large meals each day.  Eat your meals slowly, in a relaxed setting.  Limit fried foods.  Cook foods using methods other than frying.  Avoid drinking alcohol.  Avoid drinking large  amounts of liquids with your meals.  Avoid bending over or lying down until 2-3 hours after eating. WHAT FOODS ARE NOT RECOMMENDED? The following are some foods and drinks that may worsen your symptoms: Vegetables Tomatoes. Tomato juice. Tomato and spaghetti sauce. Chili peppers. Onion and garlic. Horseradish. Fruits Oranges, grapefruit, and lemon (fruit and juice). Meats High-fat meats, fish, and poultry. This includes hot dogs, ribs, ham, sausage, salami, and bacon. Dairy Whole milk and chocolate milk. Sour cream. Cream. Butter. Ice cream. Cream cheese.  Beverages Coffee and tea, with or without caffeine. Carbonated beverages or energy drinks. Condiments Hot sauce. Barbecue sauce.  Sweets/Desserts Chocolate and cocoa. Donuts. Peppermint and spearmint. Fats and Oils High-fat foods, including [place] fries and potato chips. Other Vinegar. Strong spices, such as black pepper, white pepper, red pepper, cayenne, curry powder, cloves, ginger, and chili powder. The items listed above may not be a complete list of foods and beverages to avoid. Contact your dietitian for more information. Document Released: 08/19/2005 Document Revised: 08/24/2013 Document Reviewed: 06/23/2013 [facility] Patient Information 2015 [place], [place]. This information is not intended to replace advice given to you by your health care provider. Make sure you discuss any questions you have with your health care provider.

## 2015-01-26 NOTE — Progress Notes (Signed)
Assessment and Plan:  1. Hypertension -Continue medication, monitor blood pressure at home. Continue DASH diet.  Reminder to go to the ER if any CP, SOB, nausea, dizziness, severe HA, changes vision/speech, left arm numbness and tingling and jaw pain.  2. Cholesterol -Continue diet and exercise. Check cholesterol.   3. Prediabetes  -Continue diet and exercise. Check A1C  4. Vitamin D Def - check level and continue medications.   5. Obesity with co morbidities - long discussion about weight loss, diet, and exercise  6. Lower back pain- negative straight leg NSAIDs, RICE, and exercise given,   7. LLQ tenderness Seems to be more IBS, check labs, sister has celiac, follow up Dr. Audie BoxFontaine    Continue diet and meds as discussed. Further disposition pending results of labs. Over 30 minutes of exam, counseling, chart review, and critical decision making was performed  HPI 44 y.o. female  presents for 3 month follow up on hypertension, cholesterol, prediabetes, and vitamin D deficiency.   Her blood pressure has been controlled at home, today their BP is BP: 120/82 mmHg  She does not workout. She denies chest pain, shortness of breath, dizziness. Last A1C in the office was:  Lab Results  Component Value Date   HGBA1C 5.5 08/31/2014  Patient is on Vitamin D supplement.   Lab Results  Component Value Date   VD25OH 22* 08/31/2014     BMI is Body mass index is 39.94 kg/(m^2)., she is working on diet and exercise. She has been on phentermine however her weight is up, she has not been moving as much and lunch is brought into the office daily.  Wt Readings from Last 3 Encounters:  01/26/15 240 lb (108.863 kg)  10/05/14 230 lb (104.327 kg)  08/31/14 238 lb (107.956 kg)   She has back pain in the morning and after work, Patient denies fever, hematuria, incontinence, numbness, tingling, weakness and saddle anesthesia  Has had 2 occurences of LLQ pain, lasting 2-3 mins with nausea, no other  symptoms. She has IBS and goes back and forth between constipation/diarrhea. Has 2 days heavy menses, but menses are regular, has history of ovarian cyst in the [place]. Has no seen Dr. Audie BoxFontaine for 4-5 years.   Current Medications:  Current Outpatient Prescriptions on File Prior to Visit  Medication Sig Dispense Refill  . ALPRAZolam (XANAX) 1 MG tablet TAKE 1 TABLET BY MOUTH 3 TIMES DAILY AS NEEDED 90 tablet 0  . phentermine (ADIPEX-P) 37.5 MG tablet Take 1 tablet (37.5 mg total) by mouth daily before breakfast. 30 tablet 2  . sertraline (ZOLOFT) 100 MG tablet TAKE 1 TABLET BY MOUTH EVERY DAY 90 tablet 1  . traZODone (DESYREL) 150 MG tablet Take 1 tablet (150 mg total) by mouth at bedtime. 30 tablet 3   No current facility-administered medications on file prior to visit.   Medical History:  Past Medical History  Diagnosis Date  . Migraines   . Anxiety   . Kidney stones   . Insomnia   . Depression    Allergies:  Allergies  Allergen Reactions  . Erythromycin Nausea Only  . Sulfa Antibiotics   . Amoxicillin Rash     Review of Systems:  Review of Systems  Constitutional: Negative.   HENT: Negative.   Eyes: Negative.   Respiratory: Negative.   Cardiovascular: Negative.   Gastrointestinal: Positive for abdominal pain, diarrhea and constipation. Negative for nausea, vomiting, blood in stool and melena.  Genitourinary: Negative.   Musculoskeletal: Negative.  Skin: Negative.   Neurological: Negative.   Psychiatric/Behavioral: Negative.     Family history- Review and unchanged Social history- Review and unchanged Physical Exam: BP 120/82 mmHg  Pulse 96  Temp(Src) 97.7 F (36.5 C)  Resp 16  Ht  (1.651 m)  Wt 240 lb (108.863 kg)  BMI 39.94 kg/m2 Wt Readings from Last 3 Encounters:  01/26/15 240 lb (108.863 kg)  10/05/14 230 lb (104.327 kg)  08/31/14 238 lb (107.956 kg)   General Appearance: Well nourished, in no apparent distress. Eyes: PERRLA, EOMs, conjunctiva  no swelling or erythema Sinuses: No Frontal/maxillary tenderness ENT/Mouth: Ext aud canals clear, TMs without erythema, bulging. No erythema, swelling, or exudate on post pharynx.  Tonsils not swollen or erythematous. Hearing normal.  Neck: Supple, thyroid normal.  Respiratory: Respiratory effort normal, BS equal bilaterally without rales, rhonchi, wheezing or stridor.  Cardio: RRR with no MRGs. Brisk peripheral pulses without edema.  Abdomen: Soft, + BS,  + epigastic tender and diffuse lower AB, no guarding, rebound, hernias, masses. Lymphatics: Non tender without lymphadenopathy.  Musculoskeletal: Full ROM, 5/5 strength, Normal gait Skin: Warm, dry without rashes, lesions, ecchymosis.  Neuro: Cranial nerves intact. Normal muscle tone, no cerebellar symptoms. Psych: Awake and oriented X 3, normal affect, Insight and Judgment appropriate.    Quentin Mulling, PA-C 4:35 PM [facility]

## 2015-01-27 LAB — HEPATIC FUNCTION PANEL
ALBUMIN: 3.9 g/dL (ref 3.5–5.2)
ALT: 18 U/L (ref 0–35)
AST: 21 U/L (ref 0–37)
Alkaline Phosphatase: 94 U/L (ref 39–117)
Total Bilirubin: 0.2 mg/dL (ref 0.2–1.2)
Total Protein: 6.8 g/dL (ref 6.0–8.3)

## 2015-01-27 LAB — GLIA (IGA/G) + TTG IGA
GLIADIN IGG: 1 U (ref <20)
Gliadin IgA: 4 Units (ref <20)
Tissue Transglutaminase Ab, IgA: 1 U/mL (ref <4)

## 2015-01-27 LAB — INSULIN, FASTING: INSULIN FASTING, SERUM: 25.8 u[IU]/mL — AB (ref 2.0–19.6)

## 2015-01-27 LAB — BASIC METABOLIC PANEL WITH GFR
BUN: 12 mg/dL (ref 6–23)
CALCIUM: 9.1 mg/dL (ref 8.4–10.5)
CO2: 22 meq/L (ref 19–32)
Chloride: 103 mEq/L (ref 96–112)
Creat: 0.74 mg/dL (ref 0.50–1.10)
GFR, Est African American: >89 mL/min
Glucose, Bld: 77 mg/dL (ref 70–99)
POTASSIUM: 4.4 meq/L (ref 3.5–5.3)
SODIUM: 138 meq/L (ref 135–145)

## 2015-01-27 LAB — LIPID PANEL
CHOL/HDL RATIO: 5.1 ratio
CHOLESTEROL: 220 mg/dL — AB (ref 0–200)
HDL: 43 mg/dL — ABNORMAL LOW (ref >=46)
LDL Cholesterol: 156 mg/dL — ABNORMAL HIGH (ref 0–99)
Triglycerides: 104 mg/dL (ref <150)
VLDL: 21 mg/dL (ref 0–40)

## 2015-01-27 LAB — HEMOGLOBIN A1C
Hgb A1c MFr Bld: 5.7 % — ABNORMAL HIGH (ref <5.7)
MEAN PLASMA GLUCOSE: 117 mg/dL — AB (ref <117)

## 2015-01-27 LAB — MAGNESIUM: MAGNESIUM: 2 mg/dL (ref 1.5–2.5)

## 2015-01-27 LAB — VITAMIN D 25 HYDROXY (VIT D DEFICIENCY, FRACTURES): Vit D, 25-Hydroxy: 23 ng/mL — ABNORMAL LOW (ref 30–100)

## 2015-01-27 LAB — TSH: TSH: 2.45 u[IU]/mL (ref 0.350–4.500)

## 2015-01-28 ENCOUNTER — Encounter: Payer: Self-pay | Admitting: Physician Assistant

## 2015-01-31 MED ORDER — METFORMIN HCL ER 500 MG PO TB24: ORAL_TABLET | ORAL | Status: DC

## 2015-01-31 MED ORDER — VITAMIN D (ERGOCALCIFEROL) 1.25 MG (50000 UNIT) PO CAPS: 50000.0000 [IU] | ORAL_CAPSULE | ORAL | Status: DC

## 2015-02-06 ENCOUNTER — Encounter: Payer: Self-pay | Admitting: Physician Assistant

## 2015-02-21 ENCOUNTER — Other Ambulatory Visit: Payer: Self-pay | Admitting: Physician Assistant

## 2015-04-18 ENCOUNTER — Ambulatory Visit (INDEPENDENT_AMBULATORY_CARE_PROVIDER_SITE_OTHER)
Admission: RE | Admit: 2015-04-18 | Discharge: 2015-04-18 | Disposition: A | Discharge status: Home / Self Care | Payer: Self-pay | Source: Ambulatory Visit | Attending: Internal Medicine | Admitting: Internal Medicine

## 2015-04-18 ENCOUNTER — Other Ambulatory Visit: Payer: Self-pay | Admitting: Internal Medicine

## 2015-04-18 DIAGNOSIS — R0789 Other chest pain: Secondary | ICD-10-CM

## 2015-04-19 ENCOUNTER — Other Ambulatory Visit: Payer: Self-pay | Admitting: Physician Assistant

## 2015-04-19 MED ORDER — RANITIDINE HCL 300 MG PO TABS: ORAL_TABLET | ORAL | Status: DC

## 2015-04-25 ENCOUNTER — Other Ambulatory Visit: Payer: Self-pay | Admitting: Physician Assistant

## 2015-05-01 ENCOUNTER — Ambulatory Visit: Payer: Self-pay | Admitting: Physician Assistant

## 2015-05-03 ENCOUNTER — Ambulatory Visit: Payer: Self-pay | Admitting: Physician Assistant

## 2015-05-11 ENCOUNTER — Ambulatory Visit: Payer: Self-pay | Admitting: Nurse Practitioner

## 2015-05-19 ENCOUNTER — Encounter: Payer: Self-pay | Admitting: Internal Medicine

## 2015-05-19 ENCOUNTER — Ambulatory Visit (INDEPENDENT_AMBULATORY_CARE_PROVIDER_SITE_OTHER): Payer: 59 | Admitting: Internal Medicine

## 2015-05-19 VITALS — BP 128/72 | HR 76 | Temp 98.4°F | Resp 18 | Ht 65.0 in | Wt 246.0 lb

## 2015-05-19 DIAGNOSIS — J019 Acute sinusitis, unspecified: Secondary | ICD-10-CM

## 2015-05-19 MED ORDER — BENZONATATE 200 MG PO CAPS: 200.0000 mg | ORAL_CAPSULE | Freq: Three times a day (TID) | ORAL | Status: DC | PRN

## 2015-05-19 MED ORDER — PHENYLEPH-PROMETHAZINE-COD 5-6.25-10 MG/5ML PO SYRP: 5.0000 mL | ORAL_SOLUTION | Freq: Every evening | ORAL | Status: DC | PRN

## 2015-05-19 MED ORDER — DOXYCYCLINE HYCLATE 100 MG PO CAPS: 100.0000 mg | ORAL_CAPSULE | Freq: Two times a day (BID) | ORAL | Status: DC

## 2015-05-19 MED ORDER — DEXAMETHASONE SODIUM PHOSPHATE 100 MG/10ML IJ SOLN
10.0000 mg | Freq: Once | INTRAMUSCULAR | Status: AC
  Administered 2015-05-19: 10 mg via INTRAMUSCULAR

## 2015-05-19 NOTE — Progress Notes (Signed)
Patient ID: Marie Ingram, female   DOB: September 27, 1970, 44 y.o.   MRN: [id number]  HPI  Patient presents to the office for evaluation of cough.  It has been going on for 3 days.  Patient reports night > day, dry, worse with lying down.  They also endorse change in voice, chills, fever, postnasal drip, wheezing and sinus congestion, headaches, sorethroat low grade temperature..  They have tried ibuprofen, mucinex.  They report that nothing has worked.  They admits to other sick contacts.  Patient's son is sick with similar symptoms.  She reports that the coughing is a lot worse at night time.    Review of Systems  Constitutional: Positive for fever, chills and malaise/fatigue.  HENT: Positive for congestion, ear pain and sore throat.   Respiratory: Positive for cough and wheezing. Negative for shortness of breath.   Cardiovascular: Negative for chest pain, palpitations and leg swelling.  Musculoskeletal: Positive for back pain.  Neurological: Positive for headaches.    PE:  Filed Vitals:   05/19/15 1112  BP: 128/72  Pulse: 76  Temp: 98.4 F (36.9 C)  Resp: 18   General:  Alert and non-toxic, WDWN, NAD HEENT: NCAT, PERLA, EOM normal, no occular discharge or erythema.  Nasal mucosal edema with sinus tenderness to palpation.  Oropharynx clear with minimal oropharyngeal edema and erythema.  Mucous membranes moist and pink. Neck:  Cervical adenopathy Chest:  RRR no MRGs.  Lungs clear to auscultation A&P with no wheezes rhonchi or rales.   Abdomen: +BS x 4 quadrants, soft, non-tender, no guarding, rigidity, or rebound. Skin: warm and dry no rash Neuro: A&Ox4, CN II-XII grossly intact  Assessment and Plan:   1. Acute sinusitis, recurrence not specified, unspecified location -doxycycline -ibuprofen or tylenol prn for fever -phenergan codeine for couging -zyrtec -nasal saline -mucinex - dexamethasone (DECADRON) injection 10 mg; Inject 1 mL (10 mg total) into the muscle once.

## 2015-05-26 ENCOUNTER — Other Ambulatory Visit: Payer: Self-pay | Admitting: Physician Assistant

## 2015-05-26 ENCOUNTER — Other Ambulatory Visit: Payer: Self-pay | Admitting: Internal Medicine

## 2015-05-29 NOTE — Telephone Encounter (Signed)
Called Rx in 

## 2015-06-26 ENCOUNTER — Other Ambulatory Visit: Payer: Self-pay | Admitting: Physician Assistant

## 2015-06-28 ENCOUNTER — Other Ambulatory Visit: Payer: Self-pay | Admitting: Physician Assistant

## 2015-06-29 ENCOUNTER — Other Ambulatory Visit: Payer: Self-pay | Admitting: Physician Assistant

## 2015-06-29 DIAGNOSIS — IMO0001 Reserved for inherently not codable concepts without codable children: Secondary | ICD-10-CM

## 2015-08-08 ENCOUNTER — Ambulatory Visit: Payer: Self-pay | Admitting: Physician Assistant

## 2015-08-29 ENCOUNTER — Other Ambulatory Visit: Payer: Self-pay | Admitting: Physician Assistant

## 2015-09-25 ENCOUNTER — Other Ambulatory Visit: Payer: Self-pay | Admitting: Internal Medicine

## 2015-09-25 ENCOUNTER — Telehealth: Payer: Self-pay | Admitting: *Deleted

## 2015-09-25 DIAGNOSIS — IMO0001 Reserved for inherently not codable concepts without codable children: Secondary | ICD-10-CM

## 2015-09-25 MED FILL — traZODone HCL 150 MG TABS: 150 | 30 days supply | Qty: 30 | Fill #1

## 2015-09-25 NOTE — Telephone Encounter (Signed)
Pt got a phone call from her parm saying Xanax was denied? She asked for time off from her new job with [employer] & already has a appointment scheduled in Feb as of last week. Pt says that she is completley out of both Xanax & Trazodone  Wants to know will you Please refill? If so they go to [facility] out pt pharm

## 2015-09-26 MED ORDER — ALPRAZOLAM 1 MG PO TABS: 0.5000 mg | ORAL_TABLET | Freq: Three times a day (TID) | ORAL | Status: DC | PRN

## 2015-09-26 MED ORDER — TRAZODONE HCL 150 MG PO TABS: 150.0000 mg | ORAL_TABLET | Freq: Every day | ORAL | Status: DC

## 2015-09-26 MED FILL — ALPRAZolam 1 MG TABS: 1 | 30 days supply | Qty: 90 | Fill #0

## 2015-09-26 NOTE — Telephone Encounter (Signed)
Rx called into [facility] pharmacy.

## 2015-10-04 ENCOUNTER — Ambulatory Visit (INDEPENDENT_AMBULATORY_CARE_PROVIDER_SITE_OTHER): Payer: 59 | Admitting: Physician Assistant

## 2015-10-04 ENCOUNTER — Encounter: Payer: Self-pay | Admitting: Physician Assistant

## 2015-10-04 DIAGNOSIS — E559 Vitamin D deficiency, unspecified: Secondary | ICD-10-CM | POA: Insufficient documentation

## 2015-10-04 DIAGNOSIS — R7309 Other abnormal glucose: Secondary | ICD-10-CM | POA: Insufficient documentation

## 2015-10-04 DIAGNOSIS — Z79899 Other long term (current) drug therapy: Secondary | ICD-10-CM | POA: Diagnosis not present

## 2015-10-04 DIAGNOSIS — E785 Hyperlipidemia, unspecified: Secondary | ICD-10-CM

## 2015-10-04 DIAGNOSIS — R109 Unspecified abdominal pain: Secondary | ICD-10-CM | POA: Diagnosis not present

## 2015-10-04 LAB — LIPID PANEL
CHOLESTEROL: 210 mg/dL — AB (ref 125–200)
HDL: 41 mg/dL — ABNORMAL LOW (ref >=46)
LDL Cholesterol: 140 mg/dL — ABNORMAL HIGH (ref <130)
TRIGLYCERIDES: 144 mg/dL (ref <150)
Total CHOL/HDL Ratio: 5.1 Ratio — ABNORMAL HIGH (ref <=5.0)
VLDL: 29 mg/dL (ref <30)

## 2015-10-04 LAB — BASIC METABOLIC PANEL WITH GFR
BUN: 11 mg/dL (ref 7–25)
CO2: 19 mmol/L — ABNORMAL LOW (ref 20–31)
CREATININE: 0.72 mg/dL (ref 0.50–1.10)
Calcium: 9.1 mg/dL (ref 8.6–10.2)
Chloride: 105 mmol/L (ref 98–110)
GFR, Est Non African American: >89 mL/min (ref >=60)
GLUCOSE: 73 mg/dL (ref 65–99)
POTASSIUM: 4.2 mmol/L (ref 3.5–5.3)
Sodium: 139 mmol/L (ref 135–146)

## 2015-10-04 LAB — CBC WITH DIFFERENTIAL/PLATELET
BASOS ABS: 0 10*3/uL (ref 0.0–0.1)
Basophils Relative: 0 % (ref 0–1)
EOS ABS: 0.3 10*3/uL (ref 0.0–0.7)
EOS PCT: 3 % (ref 0–5)
HEMATOCRIT: 38.4 % (ref 36.0–46.0)
Hemoglobin: 12.1 g/dL (ref 12.0–15.0)
LYMPHS ABS: 2 10*3/uL (ref 0.7–4.0)
LYMPHS PCT: 20 % (ref 12–46)
MCH: 26.5 pg (ref 26.0–34.0)
MCHC: 31.5 g/dL (ref 30.0–36.0)
MCV: 84 fL (ref 78.0–100.0)
MONO ABS: 0.6 10*3/uL (ref 0.1–1.0)
MONOS PCT: 6 % (ref 3–12)
MPV: 9.6 fL (ref 8.6–12.4)
Neutro Abs: 7.2 10*3/uL (ref 1.7–7.7)
Neutrophils Relative %: 71 % (ref 43–77)
PLATELETS: 301 10*3/uL (ref 150–400)
RBC: 4.57 MIL/uL (ref 3.87–5.11)
RDW: 15.3 % (ref 11.5–15.5)
WBC: 10.2 10*3/uL (ref 4.0–10.5)

## 2015-10-04 LAB — TSH: TSH: 2.273 u[IU]/mL (ref 0.350–4.500)

## 2015-10-04 LAB — MAGNESIUM: MAGNESIUM: 1.9 mg/dL (ref 1.5–2.5)

## 2015-10-04 LAB — HEMOGLOBIN A1C
Hgb A1c MFr Bld: 5.6 % (ref <5.7)
MEAN PLASMA GLUCOSE: 114 mg/dL (ref <117)

## 2015-10-04 LAB — HEPATIC FUNCTION PANEL
ALK PHOS: 80 U/L (ref 33–115)
ALT: 13 U/L (ref 6–29)
AST: 16 U/L (ref 10–30)
Albumin: 3.9 g/dL (ref 3.6–5.1)
BILIRUBIN INDIRECT: 0.2 mg/dL (ref 0.2–1.2)
Bilirubin, Direct: 0.1 mg/dL (ref <=0.2)
TOTAL PROTEIN: 6.7 g/dL (ref 6.1–8.1)
Total Bilirubin: 0.3 mg/dL (ref 0.2–1.2)

## 2015-10-04 MED ORDER — LISDEXAMFETAMINE DIMESYLATE 40 MG PO CAPS: 40.0000 mg | ORAL_CAPSULE | ORAL | Status: DC

## 2015-10-04 NOTE — Patient Instructions (Signed)
We want weight loss that will last so you should lose 1-2 pounds a week.  THAT IS IT! Please pick THREE things a month to change. Once it is a habit check off the item. Then pick another three items off the list to become habits.  If you are already doing a habit on the list GREAT!  Cross that item off! o Don't drink your calories. Ie, alcohol, soda, fruit juice, and sweet tea.  o Drink more water. Drink a glass when you feel hungry or before each meal.  o Eat breakfast - Complex carb and protein (likeDannon light and fit yogurt, oatmeal, fruit, eggs, turkey bacon). o Measure your cereal.  Eat no more than one cup a day. (ie Kashi) o Eat an apple a day. o Add a vegetable a day. o Try a new vegetable a month. o Use Pam! Stop using oil or butter to cook. o Don't finish your plate or use smaller plates. o Share your dessert. o Eat sugar free Jello for dessert or frozen grapes. o Don't eat 2-3 hours before bed. o Switch to whole wheat bread, pasta, and brown rice. o Make healthier choices when you eat out. No fries! o Pick baked chicken, NOT fried. o Don't forget to SLOW DOWN when you eat. It is not going anywhere.  o Take the stairs. o Park far away in the parking lot o Lift soup cans (or weights) for 10 minutes while watching TV. o Walk at work for 10 minutes during break. o Walk outside 1 time a week with your friend, kids, dog, or significant other. o Start a walking group at church. o Walk the mall as much as you can tolerate.  o Keep a food diary. o Weigh yourself daily. o Walk for 15 minutes 3 days per week. o Cook at home more often and eat out less.  If life happens and you go back to old habits, it is okay.  Just start over. You can do it!   If you experience chest pain, get short of breath, or tired during the exercise, please stop immediately and inform your doctor.   Before you even begin to attack a weight-loss plan, it pays to remember this: You are not fat. You have fat.  Losing weight isn't about blame or shame; it's simply another achievement to accomplish. Dieting is like any other skill-you have to buckle down and work at it. As long as you act in a smart, reasonable way, you'll ultimately get where you want to be. Here are some weight loss pearls for you.  1. It's Not a Diet. It's a Lifestyle Thinking of a diet as something you're on and suffering through only for the short term doesn't work. To shed weight and keep it off, you need to make permanent changes to the way you eat. It's OK to indulge occasionally, of course, but if you cut calories temporarily and then revert to your old way of eating, you'll gain back the weight quicker than you can say yo-yo. Use it to lose it. Research shows that one of the best predictors of long-term weight loss is how many pounds you drop in the first month. For that reason, nutritionists often suggest being stricter for the first two weeks of your new eating strategy to build momentum. Cut out added sugar and alcohol and avoid unrefined carbs. After that, figure out how you can reincorporate them in a way that's healthy and maintainable.  2. There's a Right   Way to Exercise Working out burns calories and fat and boosts your metabolism by building muscle. But those trying to lose weight are notorious for overestimating the number of calories they burn and underestimating the amount they take in. Unfortunately, your system is biologically programmed to hold on to extra pounds and that means when you start exercising, your body senses the deficit and ramps up its hunger signals. If you're not diligent, you'll eat everything you burn and then some. Use it to lose it. Cardio gets all the exercise glory, but strength and interval training are the real heroes. They help you build lean muscle, which in turn increases your metabolism and calorie-burning ability 3. Don't Overreact to Mild Hunger Some people have a hard time losing weight because  of hunger anxiety. To them, being hungry is bad-something to be avoided at all costs-so they carry snacks with them and eat when they don't need to. Others eat because they're stressed out or bored. While you never want to get to the point of being ravenous (that's when bingeing is likely to happen), a hunger pang, a craving, or the fact that it's 3:00 p.m. should not send you racing for the vending machine or obsessing about the energy bar in your purse. Ideally, you should put off eating until your stomach is growling and it's difficult to concentrate.  Use it to lose it. When you feel the urge to eat, use the HALT method. Ask yourself, Am I really hungry? Or am I angry or anxious, lonely or bored, or tired? If you're still not certain, try the apple test. If you're truly hungry, an apple should seem delicious; if it doesn't, something else is going on. Or you can try drinking water and making yourself busy, if you are still hungry try a healthy snack.  4. Not All Calories Are Created Equal The mechanics of weight loss are pretty simple: Take in fewer calories than you use for energy. But the kind of food you eat makes all the difference. Processed food that's high in saturated fat and refined starch or sugar can cause inflammation that disrupts the hormone signals that tell your brain you're full. The result: You eat a lot more.  Use it to lose it. Clean up your diet. Swap in whole, unprocessed foods, including vegetables, lean protein, and healthy fats that will fill you up and give you the biggest nutritional bang for your calorie buck. In a few weeks, as your brain starts receiving regular hunger and fullness signals once again, you'll notice that you feel less hungry overall and naturally start cutting back on the amount you eat.  5. Protein, Produce, and Plant-Based Fats Are Your Weight-Loss Trinity Here's why eating the three Ps regularly will help you drop pounds. Protein fills you up. You need it  to build lean muscle, which keeps your metabolism humming so that you can torch more fat. People in a weight-loss program who ate double the recommended daily allowance for protein (about 110 grams for a 150-pound woman) lost 70 percent of their weight from fat, while people who ate the RDA lost only about 40 percent, one study found. Produce is packed with filling fiber. "It's very difficult to consume too many calories if you're eating a lot of vegetables. Example: Three cups of broccoli is a lot of food, yet only 93 calories. (Fruit is another story. It can be easy to overeat and can contain a lot of calories from sugar, so be sure to   monitor your intake.) Plant-based fats like olive oil and those in avocados and nuts are healthy and extra satiating.  Use it to lose it. Aim to incorporate each of the three Ps into every meal and snack. People who eat protein throughout the day are able to keep weight off, according to a study in the American Journal of Clinical Nutrition. In addition to meat, poultry and seafood, good sources are beans, lentils, eggs, tofu, and yogurt. As for fat, keep portion sizes in check by measuring out salad dressing, oil, and nut butters (shoot for one to two tablespoons). Finally, eat veggies or a little fruit at every meal. People who did that consumed 308 fewer calories but didn't feel any hungrier than when they didn't eat more produce.  7. How You Eat Is As Important As What You Eat In order for your brain to register that you're full, you need to focus on what you're eating. Sit down whenever you eat, preferably at a table. Turn off the TV or computer, put down your phone, and look at your food. Smell it. Chew slowly, and don't put another bite on your fork until you swallow. When women ate lunch this attentively, they consumed 30 percent less when snacking later than those who listened to an audiobook at lunchtime, according to a study in the British Journal of Nutrition. 8.  Weighing Yourself Really Works The scale provides the best evidence about whether your efforts are paying off. Seeing the numbers tick up or down or stagnate is motivation to keep going-or to rethink your approach. A 2015 study at [employer] found that daily weigh-ins helped people lose more weight, keep it off, and maintain that loss, even after two years. Use it to lose it. Step on the scale at the same time every day for the best results. If your weight shoots up several pounds from one weigh-in to the next, don't freak out. Eating a lot of salt the night before or having your period is the likely culprit. The number should return to normal in a day or two. It's a steady climb that you need to do something about. 9. Too Much Stress and Too Little Sleep Are Your Enemies When you're tired and frazzled, your body cranks up the production of cortisol, the stress hormone that can cause carb cravings. Not getting enough sleep also boosts your levels of ghrelin, a hormone associated with hunger, while suppressing leptin, a hormone that signals fullness and satiety. People on a diet who slept only five and a half hours a night for two weeks lost 55 percent less fat and were hungrier than those who slept eight and a half hours, according to a study in the [employer] Medical Association Journal. Use it to lose it. Prioritize sleep, aiming for seven hours or more a night, which research shows helps lower stress. And make sure you're getting quality zzz's. If a snoring spouse or a fidgety cat wakes you up frequently throughout the night, you may end up getting the equivalent of just four hours of sleep, according to a study from [employer]. Keep pets out of the bedroom, and use a white-noise app to drown out snoring. 10. You Will Hit a plateau-And You Can Bust Through It As you slim down, your body releases much less leptin, the fullness hormone.  If you're not strength training, start right now.  Building muscle can raise your metabolism to help you overcome a plateau. To keep your body challenged   and burning calories, incorporate new moves and more intense intervals into your workouts or add another sweat session to your weekly routine. Alternatively, cut an extra 100 calories or so a day from your diet. Now that you've lost weight, your body simply doesn't need as much fuel.   

## 2015-10-04 NOTE — Progress Notes (Signed)
Assessment and Plan:  1. Hypertension -Continue medication, monitor blood pressure at home. Continue DASH diet.  Reminder to go to the ER if any CP, SOB, nausea, dizziness, severe HA, changes vision/speech, left arm numbness and tingling and jaw pain.  2. Prediabetes  -Continue diet and exercise. Check A1C  3. Vitamin D Def - check level and continue medications.   4. Obesity with co morbidities - long discussion about weight loss, diet, and exercise  5. RUQ pain/RLQ pain with history of ovarian cyst Continue zantac, suggest ABUS/transvaginal US however patient wants to wait until she sees Dr. Audie Box.   6. ADD Can not tolerate adderall/ritalin, will try vyvanse  Continue diet and meds as discussed. Further disposition pending results of labs. Over 30 minutes of exam, counseling, chart review, and critical decision making was performed  HPI 45 y.o. female  presents for 3 month follow up on hypertension, cholesterol, prediabetes, and vitamin D deficiency.  She has history of IBS, has diarrhea very rare constipation, no mucus, black stool, blood in stool. When her diet is better she states that it helps. Diarrhea worse around her menses. Has appointment Monday with Dr. Cooper Render, has had history of ovarian cyst/endometriosis in the past.  Has had heart burn, some epigastric pain.  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   Lab Results  Component Value Date   CHOL 220* 01/26/2015   HDL 43* 01/26/2015   LDLCALC 156* 01/26/2015   TRIG 104 01/26/2015   CHOLHDL 5.1 01/26/2015    Her blood pressure has been controlled at home, today their BP is BP: 120/76 mmHg  She does not workout. She denies chest pain, shortness of breath, dizziness. Last A1C in the office was:  Lab Results  Component Value Date   HGBA1C 5.7* 01/26/2015  Patient is on Vitamin D  supplement.   Lab Results  Component Value Date   VD25OH 23* 01/26/2015     BMI is Body mass index is 42.97 kg/(m^2)., she is working on diet and exercise, starting weight loss program at work, weighs every Friday.  Wt Readings from Last 3 Encounters:  10/04/15 258 lb 3.2 oz (117.119 kg)  05/19/15 246 lb (111.585 kg)  01/26/15 240 lb (108.863 kg)    Current Medications:  Current Outpatient Prescriptions on File Prior to Visit  Medication Sig Dispense Refill  . ALPRAZolam (XANAX) 1 MG tablet Take 0.5-1 tablets (0.5-1 mg total) by mouth 3 (three) times daily as needed. 90 tablet 0  . hyoscyamine (LEVSIN) 0.125 MG tablet Take 1 tablet (0.125 mg total) by mouth every 4 (four) hours as needed for cramping (diarrhea, nausea). 50 tablet 2  . ranitidine (ZANTAC) 300 MG tablet Take twice a day for 2 weeks, then can go to once at night 60 tablet 3  . traZODone (DESYREL) 150 MG tablet Take 1 tablet (150 mg total) by mouth at bedtime. 30 tablet 1   No current facility-administered medications on file prior to visit.   Medical History:  Past Medical History  Diagnosis Date  . Migraines   . Anxiety   . Kidney stones   . Insomnia   . Depression    Allergies:  Allergies  Allergen Reactions  . Erythromycin Nausea Only  . Sulfa Antibiotics   . Amoxicillin Rash  . Penicillins Rash     Review of Systems:  Review of  Systems  Constitutional: Negative.   HENT: Negative.   Eyes: Negative.   Respiratory: Negative.   Cardiovascular: Negative.   Gastrointestinal: Positive for abdominal pain, diarrhea and constipation. Negative for nausea, vomiting, blood in stool and melena.  Genitourinary: Negative.   Musculoskeletal: Negative.   Skin: Negative.   Neurological: Negative.   Psychiatric/Behavioral: Negative.     Family history- Review and unchanged Social history- Review and unchanged Physical Exam: BP 120/76 mmHg  Pulse 106  Temp(Src) 98.1 F (36.7 C) (Temporal)  Resp 16  Ht   (1.651 m)  Wt 258 lb 3.2 oz (117.119 kg)  BMI 42.97 kg/m2  SpO2 97%  LMP 09/04/2015 Wt Readings from Last 3 Encounters:  10/04/15 258 lb 3.2 oz (117.119 kg)  05/19/15 246 lb (111.585 kg)  01/26/15 240 lb (108.863 kg)   General Appearance: Well nourished, in no apparent distress. Eyes: PERRLA, EOMs, conjunctiva no swelling or erythema Sinuses: No Frontal/maxillary tenderness ENT/Mouth: Ext aud canals clear, TMs without erythema, bulging. No erythema, swelling, or exudate on post pharynx.  Tonsils not swollen or erythematous. Hearing normal.  Neck: Supple, thyroid normal.  Respiratory: Respiratory effort normal, BS equal bilaterally without rales, rhonchi, wheezing or stridor.  Cardio: RRR with no MRGs. Brisk peripheral pulses without edema.  Abdomen: Soft, + BS,  +RUQ tenderness and diffuse lower AB, no guarding, rebound, hernias, masses. Lymphatics: Non tender without lymphadenopathy.  Musculoskeletal: Full ROM, 5/5 strength, Normal gait Skin: Warm, dry without rashes, lesions, ecchymosis.  Neuro: Cranial nerves intact. Normal muscle tone, no cerebellar symptoms. Psych: Awake and oriented X 3, normal affect, Insight and Judgment appropriate.    Quentin Mulling, PA-C 11:57 AM [facility]

## 2015-10-05 LAB — VITAMIN D 25 HYDROXY (VIT D DEFICIENCY, FRACTURES): VIT D 25 HYDROXY: 15 ng/mL — AB (ref 30–100)

## 2015-10-12 ENCOUNTER — Ambulatory Visit (INDEPENDENT_AMBULATORY_CARE_PROVIDER_SITE_OTHER): Payer: 59 | Admitting: Gynecology

## 2015-10-12 ENCOUNTER — Encounter: Payer: Self-pay | Admitting: Gynecology

## 2015-10-12 ENCOUNTER — Other Ambulatory Visit (HOSPITAL_COMMUNITY)
Admission: RE | Admit: 2015-10-12 | Discharge: 2015-10-12 | Disposition: A | Discharge status: Home / Self Care | Payer: 59 | Source: Ambulatory Visit | Attending: Gynecology | Admitting: Gynecology

## 2015-10-12 ENCOUNTER — Ambulatory Visit: Payer: Self-pay | Admitting: Gynecology

## 2015-10-12 VITALS — BP 120/78 | Ht 65.0 in | Wt 254.0 lb

## 2015-10-12 DIAGNOSIS — Z01419 Encounter for gynecological examination (general) (routine) without abnormal findings: Secondary | ICD-10-CM | POA: Insufficient documentation

## 2015-10-12 DIAGNOSIS — N946 Dysmenorrhea, unspecified: Secondary | ICD-10-CM | POA: Diagnosis not present

## 2015-10-12 DIAGNOSIS — N920 Excessive and frequent menstruation with regular cycle: Secondary | ICD-10-CM | POA: Diagnosis not present

## 2015-10-12 DIAGNOSIS — Z1151 Encounter for screening for human papillomavirus (HPV): Secondary | ICD-10-CM | POA: Insufficient documentation

## 2015-10-12 NOTE — Patient Instructions (Signed)
Follow up for ultrasound as scheduled.  Call to Schedule your mammogram  Facilities in [place]: 1)  [facility]. [facility], [address] Phone: [phone] 2)  Dr. Bertrand at [facility]  [address] Phone: [phone]     Mammogram A mammogram is an X-ray test to find changes in a woman's breast. You should get a mammogram if:  You are 45 years of age or older  You have risk factors.   Your doctor recommends that you have one.  BEFORE THE TEST  Do not schedule the test the week before your period, especially if your breasts are sore during this time.  On the day of your mammogram:  Wash your breasts and armpits well. After washing, do not put on any deodorant or talcum powder on until after your test.   Eat and drink as you usually do.   Take your medicines as usual.   If you are diabetic and take insulin, make sure you:   Eat before coming for your test.   Take your insulin as usual.   If you cannot keep your appointment, call before the appointment to cancel. Schedule another appointment.  TEST  You will need to undress from the waist up. You will put on a hospital gown.   Your breast will be put on the mammogram machine, and it will press firmly on your breast with a piece of plastic called a compression paddle. This will make your breast flatter so that the machine can X-ray all parts of your breast.   Both breasts will be X-rayed. Each breast will be X-rayed from above and from the side. An X-ray might need to be taken again if the picture is not good enough.   The mammogram will last about 15 to 30 minutes.  AFTER THE TEST Finding out the results of your test Ask when your test results will be ready. Make sure you get your test results.  Document Released: 11/15/2008 Document Revised: 08/08/2011 Document Reviewed: 11/15/2008 Exit[employer] Patient Information 2012 [employer].  You  may obtain a copy of any labs that were done today by logging onto MyChart as outlined in the instructions provided with your AVS (after visit summary). The office will not call with normal lab results but certainly if there are any significant abnormalities then we will contact you.   Health Maintenance Adopting a healthy lifestyle and getting preventive care can go a long way to promote health and wellness. Talk with your health care provider about what schedule of regular examinations is right for you. This is a good chance for you to check in with your provider about disease prevention and staying healthy. In between checkups, there are plenty of things you can do on your own. Experts have done a lot of research about which lifestyle changes and preventive measures are most likely to keep you healthy. Ask your health care provider for more information. WEIGHT AND DIET  Eat a healthy diet  Be sure to include plenty of vegetables, fruits, low-fat dairy products, and lean protein.  Do not eat a lot of foods high in solid fats, added sugars, or salt.  Get regular exercise. This is one of the most important things you can do for your health.  Most adults should exercise for at least 150 minutes each week. The exercise should increase your heart rate and make you sweat (moderate-intensity exercise).  Most adults should also   do strengthening exercises at least twice a week. This is in addition to the moderate-intensity exercise.  Maintain a healthy weight  Body mass index (BMI) is a measurement that can be used to identify possible weight problems. It estimates body fat based on height and weight. Your health care provider can help determine your BMI and help you achieve or maintain a healthy weight.  For females 20 years of age and older:   A BMI below 18.5 is considered underweight.  A BMI of 18.5 to 24.9 is normal.  A BMI of 25 to 29.9 is considered overweight.  A BMI of 30 and above  is considered obese.  Watch levels of cholesterol and blood lipids  You should start having your blood tested for lipids and cholesterol at 45 years of age, then have this test every 5 years.  You may need to have your cholesterol levels checked more often if:  Your lipid or cholesterol levels are high.  You are older than 45 years of age.  You are at high risk for heart disease.  CANCER SCREENING   Lung Cancer  Lung cancer screening is recommended for adults 55-80 years old who are at high risk for lung cancer because of a history of smoking.  A yearly low-dose CT scan of the lungs is recommended for people who:  Currently smoke.  Have quit within the past 15 years.  Have at least a 30-pack-year history of smoking. A pack year is smoking an average of one pack of cigarettes a day for 1 year.  Yearly screening should continue until it has been 15 years since you quit.  Yearly screening should stop if you develop a health problem that would prevent you from having lung cancer treatment.  Breast Cancer  Practice breast self-awareness. This means understanding how your breasts normally appear and feel.  It also means doing regular breast self-exams. Let your health care provider know about any changes, no matter how small.  If you are in your 20s or 30s, you should have a clinical breast exam (CBE) by a health care provider every 1-3 years as part of a regular health exam.  If you are 40 or older, have a CBE every year. Also consider having a breast X-ray (mammogram) every year.  If you have a family history of breast cancer, talk to your health care provider about genetic screening.  If you are at high risk for breast cancer, talk to your health care provider about having an MRI and a mammogram every year.  Breast cancer gene (BRCA) assessment is recommended for women who have family members with BRCA-related cancers. BRCA-related cancers  include:  Breast.  Ovarian.  Tubal.  Peritoneal cancers.  Results of the assessment will determine the need for genetic counseling and BRCA1 and BRCA2 testing. Cervical Cancer Routine pelvic examinations to screen for cervical cancer are no longer recommended for nonpregnant women who are considered low risk for cancer of the pelvic organs (ovaries, uterus, and vagina) and who do not have symptoms. A pelvic examination may be necessary if you have symptoms including those associated with pelvic infections. Ask your health care provider if a screening pelvic exam is right for you.   The Pap test is the screening test for cervical cancer for women who are considered at risk.  If you had a hysterectomy for a problem that was not cancer or a condition that could lead to cancer, then you no longer need Pap tests.  If   you are older than 65 years, and you have had normal Pap tests for the past 10 years, you no longer need to have Pap tests.  If you have had past treatment for cervical cancer or a condition that could lead to cancer, you need Pap tests and screening for cancer for at least 20 years after your treatment.  If you no longer get a Pap test, assess your risk factors if they change (such as having a new sexual partner). This can affect whether you should start being screened again.  Some women have medical problems that increase their chance of getting cervical cancer. If this is the case for you, your health care provider may recommend more frequent screening and Pap tests.  The human papillomavirus (HPV) test is another test that may be used for cervical cancer screening. The HPV test looks for the virus that can cause cell changes in the cervix. The cells collected during the Pap test can be tested for HPV.  The HPV test can be used to screen women 89 years of age and older. Getting tested for HPV can extend the interval between normal Pap tests from three to five years.  An HPV  test also should be used to screen women of any age who have unclear Pap test results.  After 45 years of age, women should have HPV testing as often as Pap tests.  Colorectal Cancer  This type of cancer can be detected and often prevented.  Routine colorectal cancer screening usually begins at 45 years of age and continues through 45 years of age.  Your health care provider may recommend screening at an earlier age if you have risk factors for colon cancer.  Your health care provider may also recommend using home test kits to check for hidden blood in the stool.  A small camera at the end of a tube can be used to examine your colon directly (sigmoidoscopy or colonoscopy). This is done to check for the earliest forms of colorectal cancer.  Routine screening usually begins at age 82.  Direct examination of the colon should be repeated every 5-10 years through 45 years of age. However, you may need to be screened more often if early forms of precancerous polyps or small growths are found. Skin Cancer  Check your skin from head to toe regularly.  Tell your health care provider about any new moles or changes in moles, especially if there is a change in a mole's shape or color.  Also tell your health care provider if you have a mole that is larger than the size of a pencil eraser.  Always use sunscreen. Apply sunscreen liberally and repeatedly throughout the day.  Protect yourself by wearing long sleeves, pants, a wide-brimmed hat, and sunglasses whenever you are outside. HEART DISEASE, DIABETES, AND HIGH BLOOD PRESSURE   Have your blood pressure checked at least every 1-2 years. High blood pressure causes heart disease and increases the risk of stroke.  If you are between 67 years and 100 years old, ask your health care provider if you should take aspirin to prevent strokes.  Have regular diabetes screenings. This involves taking a blood sample to check your fasting blood sugar  level.  If you are at a normal weight and have a low risk for diabetes, have this test once every three years after 45 years of age.  If you are overweight and have a high risk for diabetes, consider being tested at a younger age or  more often. PREVENTING INFECTION  Hepatitis B  If you have a higher risk for hepatitis B, you should be screened for this virus. You are considered at high risk for hepatitis B if:  You were born in a country where hepatitis B is common. Ask your health care provider which countries are considered high risk.  Your parents were born in a high-risk country, and you have not been immunized against hepatitis B (hepatitis B vaccine).  You have HIV or AIDS.  You use needles to inject street drugs.  You live with someone who has hepatitis B.  You have had sex with someone who has hepatitis B.  You get hemodialysis treatment.  You take certain medicines for conditions, including cancer, organ transplantation, and autoimmune conditions. Hepatitis C  Blood testing is recommended for:  Everyone born from [birth year] through 1965.  Anyone with known risk factors for hepatitis C. Sexually transmitted infections (STIs)  You should be screened for sexually transmitted infections (STIs) including gonorrhea and chlamydia if:  You are sexually active and are younger than 45 years of age.  You are older than 45 years of age and your health care provider tells you that you are at risk for this type of infection.  Your sexual activity has changed since you were last screened and you are at an increased risk for chlamydia or gonorrhea. Ask your health care provider if you are at risk.  If you do not have HIV, but are at risk, it may be recommended that you take a prescription medicine daily to prevent HIV infection. This is called pre-exposure prophylaxis (PrEP). You are considered at risk if:  You are sexually active and do not regularly use condoms or know the HIV status  of your partner(s).  You take drugs by injection.  You are sexually active with a partner who has HIV. Talk with your health care provider about whether you are at high risk of being infected with HIV. If you choose to begin PrEP, you should first be tested for HIV. You should then be tested every 3 months for as long as you are taking PrEP.  PREGNANCY   If you are premenopausal and you may become pregnant, ask your health care provider about preconception counseling.  If you may become pregnant, take 400 to 800 micrograms (mcg) of folic acid every day.  If you want to prevent pregnancy, talk to your health care provider about birth control (contraception). OSTEOPOROSIS AND MENOPAUSE   Osteoporosis is a disease in which the bones lose minerals and strength with aging. This can result in serious bone fractures. Your risk for osteoporosis can be identified using a bone density scan.  If you are 57 years of age or older, or if you are at risk for osteoporosis and fractures, ask your health care provider if you should be screened.  Ask your health care provider whether you should take a calcium or vitamin D supplement to lower your risk for osteoporosis.  Menopause may have certain physical symptoms and risks.  Hormone replacement therapy may reduce some of these symptoms and risks. Talk to your health care provider about whether hormone replacement therapy is right for you.  HOME CARE INSTRUCTIONS   Schedule regular health, dental, and eye exams.  Stay current with your immunizations.   Do not use any tobacco products including cigarettes, chewing tobacco, or electronic cigarettes.  If you are pregnant, do not drink alcohol.  If you are breastfeeding, limit how much and  how often you drink alcohol.  Limit alcohol intake to no more than 1 drink per day for nonpregnant women. One drink equals 12 ounces of beer, 5 ounces of wine, or 1 ounces of hard liquor.  Do not use street  drugs.  Do not share needles.  Ask your health care provider for help if you need support or information about quitting drugs.  Tell your health care provider if you often feel depressed.  Tell your health care provider if you have ever been abused or do not feel safe at home. Document Released: 03/04/2011 Document Revised: 01/03/2014 Document Reviewed: 07/21/2013 [facility] Patient Information 2015 [place], [place]. This information is not intended to replace advice given to you by your health care provider. Make sure you discuss any questions you have with your health care provider.

## 2015-10-12 NOTE — Progress Notes (Signed)
Marie Ingram 11/27/1970 [id number]        45 y.o.  G2P2  for annual exam.  Has not been seen in the office since 2010. Several issues noted below.  Past medical history,surgical history, problem list, medications, allergies, family history and social history were all reviewed and documented as reviewed in the EPIC chart.  ROS:  Performed with pertinent positives and negatives included in the history, assessment and plan.   Additional significant findings :  none   Exam: Kennon Portela assistant Filed Vitals:   10/12/15 0904  BP: 120/78  Height:  (1.651 m)  Weight: 254 lb (115.214 kg)   General appearance:  Normal affect, orientation and appearance. Skin: Grossly normal HEENT: Without gross lesions.  No cervical or supraclavicular adenopathy. Thyroid normal.  Lungs:  Clear without wheezing, rales or rhonchi Cardiac: RR, without RMG Abdominal:  Soft, nontender, without masses, guarding, rebound, organomegaly or hernia Breasts:  Examined lying and sitting without masses, retractions, discharge or axillary adenopathy. Pelvic:  Ext/BUS/vagina normal  Cervix normal. Pap/HPV  Uterus grossly normal size, midline and mobile nontender   Adnexa without masses or tenderness    Anus and perineum normal   Rectovaginal normal sphincter tone without palpated masses or tenderness.    Assessment/Plan:  45 y.o. G2P2 female for annual exam with regular menses, not sexually active.   1. Worsening dysmenorrhea menorrhagia. Past history of endometriosis status post laser ablation 2004 by Dr. Eda Paschal. 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 Was also described at both of her C-sections.  Menses are occurring monthly seem to be getting more painful and heavier. Recent hemoglobin 12. Start with sonohysterogram for better cavity assessment. Discussed options to include hormonal manipulation menstrual suppression such as low-dose oral  contraceptives or Mirena IUD. Surgery such as laparoscopy up to including hysterectomy. Uterus is very well supported and the issues whether this could be accomplished laparoscopically versus TAH. Also option for referral for robotic consideration discussed with her. Will rediscuss after sonohysterogram. 2. Pap smear 2010. Pap smear/HPV today. No history of significant abnormal Pap smears previously. 3. Mammogram never. Strongly recommended patient schedule screening mammogram and she agrees to do so. SBE monthly reviewed. 4. Health maintenance. Recently had routine blood work through her primary physician's office. Follow up for sonohysterogram as scheduled.   Dara Lords MD, 9:47 AM 10/12/2015

## 2015-10-12 NOTE — Addendum Note (Signed)
Addended by: Dayna Barker on: 10/12/2015 10:24 AM   Modules accepted: Orders

## 2015-10-13 LAB — CYTOLOGY - PAP

## 2015-10-15 ENCOUNTER — Encounter: Payer: Self-pay | Admitting: Physician Assistant

## 2015-10-15 ENCOUNTER — Other Ambulatory Visit: Payer: Self-pay | Admitting: Physician Assistant

## 2015-10-15 MED ORDER — METRONIDAZOLE 0.75 % VA GEL: 1.0000 | Freq: Every day | VAGINAL | Status: DC

## 2015-10-16 ENCOUNTER — Other Ambulatory Visit: Payer: Self-pay | Admitting: Gynecology

## 2015-10-16 DIAGNOSIS — N939 Abnormal uterine and vaginal bleeding, unspecified: Secondary | ICD-10-CM

## 2015-10-25 ENCOUNTER — Other Ambulatory Visit: Payer: Self-pay | Admitting: Physician Assistant

## 2015-10-25 DIAGNOSIS — IMO0001 Reserved for inherently not codable concepts without codable children: Secondary | ICD-10-CM

## 2015-10-25 MED ORDER — LISDEXAMFETAMINE DIMESYLATE 40 MG PO CAPS: 40.0000 mg | ORAL_CAPSULE | ORAL | Status: DC

## 2015-10-25 MED ORDER — ALPRAZOLAM 1 MG PO TABS: 0.5000 mg | ORAL_TABLET | Freq: Three times a day (TID) | ORAL | Status: DC | PRN

## 2015-10-25 MED ORDER — TRAZODONE HCL 150 MG PO TABS: 150.0000 mg | ORAL_TABLET | Freq: Every day | ORAL | Status: DC

## 2015-10-25 MED FILL — ALPRAZolam 1 MG TABS: 1 | 30 days supply | Qty: 90 | Fill #0

## 2015-10-25 MED FILL — metroNIDAZOLE 0.75 % GEL: 0.75 | 5 days supply | Qty: 70 | Fill #0

## 2015-10-25 MED FILL — traZODone HCL 150 MG TABS: 150 | 30 days supply | Qty: 30 | Fill #0

## 2015-11-03 MED FILL — VYVANSE 40 MG CAPSULE: 40 | 30 days supply | Qty: 30 | Fill #0

## 2015-11-07 ENCOUNTER — Telehealth: Payer: Self-pay | Admitting: Gynecology

## 2015-11-07 NOTE — Telephone Encounter (Signed)
11/07/15-I LM VM for pt that her U[employer] ins covers the sonohysterogram/bx with 20% coins. Allowable for test is $893.27 so pt owes $178.65. If the bx is done, allowable is $275.02 so pt will owe $55.00. Pt to call if this is a problem.wl Per Clydie BraunKaren @[facility] -ZOX#[id number]#[phone].wl

## 2015-11-13 ENCOUNTER — Ambulatory Visit: Payer: 59 | Admitting: Gynecology

## 2015-11-13 ENCOUNTER — Other Ambulatory Visit: Payer: 59

## 2015-11-17 ENCOUNTER — Ambulatory Visit: Payer: Self-pay | Admitting: Gynecology

## 2015-11-21 ENCOUNTER — Other Ambulatory Visit: Payer: Self-pay | Admitting: Physician Assistant

## 2015-11-21 DIAGNOSIS — IMO0001 Reserved for inherently not codable concepts without codable children: Secondary | ICD-10-CM

## 2015-11-21 MED ORDER — ALPRAZOLAM 1 MG PO TABS: 0.5000 mg | ORAL_TABLET | Freq: Three times a day (TID) | ORAL | Status: DC | PRN

## 2015-11-21 MED FILL — ALPRAZolam 1 MG TABS: 1 | 30 days supply | Qty: 90 | Fill #0

## 2015-11-21 NOTE — Progress Notes (Signed)
Rx called into pharmacy & pt was called but had to LVM for pt to return office call.

## 2015-11-28 MED FILL — traZODone HCL 150 MG TABS: 150 | 30 days supply | Qty: 30 | Fill #1

## 2015-12-04 ENCOUNTER — Telehealth: Payer: Self-pay

## 2015-12-04 ENCOUNTER — Other Ambulatory Visit: Payer: Self-pay | Admitting: Physician Assistant

## 2015-12-04 MED ORDER — LISDEXAMFETAMINE DIMESYLATE 40 MG PO CAPS: 40.0000 mg | ORAL_CAPSULE | ORAL | Status: DC

## 2015-12-04 NOTE — Telephone Encounter (Signed)
SPOKE WITH PT TO LET PT KNOW THAT RX WAS READY TO BE PICKED UP & PT ASKED IF I WOULD MAIL HER VYVANSE RX TO HER DUE HER BEING UNABLE TO COME PICK IT UP. RX WAS MAILED PLACED IN THE MAIL THAT WOULD BE SENT OUT IN THE AM 12/05/15 BY D.D.   By Gregery NaAngela D Latron Ribas, CMA

## 2015-12-08 MED FILL — VYVANSE 40 MG CAPSULE: 40 | 30 days supply | Qty: 30 | Fill #0

## 2015-12-22 ENCOUNTER — Other Ambulatory Visit: Payer: Self-pay | Admitting: Internal Medicine

## 2015-12-22 ENCOUNTER — Other Ambulatory Visit: Payer: Self-pay | Admitting: Physician Assistant

## 2015-12-22 DIAGNOSIS — IMO0001 Reserved for inherently not codable concepts without codable children: Secondary | ICD-10-CM

## 2015-12-22 MED ORDER — ALPRAZOLAM 1 MG PO TABS: 0.5000 mg | ORAL_TABLET | Freq: Three times a day (TID) | ORAL | Status: DC | PRN

## 2015-12-22 MED FILL — ALPRAZolam 1 MG TABS: 1 | 30 days supply | Qty: 90 | Fill #0

## 2015-12-25 ENCOUNTER — Other Ambulatory Visit: Payer: Self-pay | Admitting: Physician Assistant

## 2016-01-02 MED FILL — traZODone HCL 150 MG TABS: 150 | 30 days supply | Qty: 30 | Fill #2

## 2016-01-03 ENCOUNTER — Encounter: Payer: Self-pay | Admitting: Physician Assistant

## 2016-01-03 MED ORDER — LISDEXAMFETAMINE DIMESYLATE 40 MG PO CAPS: 40.0000 mg | ORAL_CAPSULE | ORAL | Status: DC

## 2016-01-09 MED FILL — VYVANSE 40 MG CAPSULE: 40 | 30 days supply | Qty: 30 | Fill #0

## 2016-01-19 MED FILL — ALPRAZolam 1 MG TABS: 1 | 30 days supply | Qty: 90 | Fill #1

## 2016-02-06 MED FILL — traZODone HCL 150 MG TABS: 150 | 30 days supply | Qty: 30 | Fill #0

## 2016-02-12 ENCOUNTER — Other Ambulatory Visit: Payer: Self-pay | Admitting: Physician Assistant

## 2016-02-12 MED ORDER — LISDEXAMFETAMINE DIMESYLATE 40 MG PO CAPS: 40.0000 mg | ORAL_CAPSULE | ORAL | Status: DC

## 2016-02-15 MED FILL — ALPRAZolam 1 MG TABS: 1 | 30 days supply | Qty: 90 | Fill #2

## 2016-02-15 MED FILL — VYVANSE 40 MG CAPSULE: 40 | 30 days supply | Qty: 30 | Fill #0

## 2016-03-12 ENCOUNTER — Other Ambulatory Visit: Payer: Self-pay | Admitting: Physician Assistant

## 2016-03-12 MED ORDER — LISDEXAMFETAMINE DIMESYLATE 40 MG PO CAPS: 40.0000 mg | ORAL_CAPSULE | ORAL | Status: DC

## 2016-03-15 MED FILL — VYVANSE 40 MG CAPSULE: 40 | 30 days supply | Qty: 30 | Fill #0

## 2016-03-15 MED FILL — traZODone HCL 150 MG TABS: 150 | 30 days supply | Qty: 30 | Fill #1

## 2016-03-15 MED FILL — ALPRAZolam 1 MG TABS: 1 | 30 days supply | Qty: 90 | Fill #3

## 2016-03-26 ENCOUNTER — Encounter: Payer: Self-pay | Admitting: Internal Medicine

## 2016-04-15 ENCOUNTER — Other Ambulatory Visit: Payer: Self-pay | Admitting: Internal Medicine

## 2016-04-15 ENCOUNTER — Other Ambulatory Visit: Payer: Self-pay | Admitting: Physician Assistant

## 2016-04-15 DIAGNOSIS — IMO0001 Reserved for inherently not codable concepts without codable children: Secondary | ICD-10-CM

## 2016-04-16 ENCOUNTER — Other Ambulatory Visit: Payer: Self-pay | Admitting: Physician Assistant

## 2016-04-16 MED ORDER — LISDEXAMFETAMINE DIMESYLATE 40 MG PO CAPS: 40.0000 mg | ORAL_CAPSULE | ORAL | 0 refills | Status: DC

## 2016-04-16 MED FILL — traZODone HCL 150 MG TABS: 150 | 30 days supply | Qty: 30 | Fill #0

## 2016-04-16 MED FILL — ALPRAZolam 1 MG TABS: 1 | 30 days supply | Qty: 90 | Fill #0

## 2016-04-16 MED FILL — VYVANSE 40 MG CAPSULE: 40 | 30 days supply | Qty: 30 | Fill #0

## 2016-04-16 NOTE — Telephone Encounter (Signed)
[facility].

## 2016-04-24 ENCOUNTER — Encounter: Payer: Self-pay | Admitting: Physician Assistant

## 2016-04-30 ENCOUNTER — Other Ambulatory Visit: Payer: Self-pay | Admitting: Physician Assistant

## 2016-05-14 ENCOUNTER — Other Ambulatory Visit: Payer: Self-pay | Admitting: Internal Medicine

## 2016-05-14 ENCOUNTER — Other Ambulatory Visit: Payer: Self-pay | Admitting: Physician Assistant

## 2016-05-14 DIAGNOSIS — IMO0001 Reserved for inherently not codable concepts without codable children: Secondary | ICD-10-CM

## 2016-05-14 MED FILL — ALPRAZolam 1 MG TABS: 1 | 30 days supply | Qty: 90 | Fill #0

## 2016-05-14 MED FILL — traZODone HCL 150 MG TABS: 150 | 30 days supply | Qty: 30 | Fill #0

## 2016-05-14 NOTE — Telephone Encounter (Signed)
Rx called into [facility] pharmacy. 

## 2016-06-05 ENCOUNTER — Other Ambulatory Visit: Payer: Self-pay | Admitting: Physician Assistant

## 2016-06-05 ENCOUNTER — Ambulatory Visit (INDEPENDENT_AMBULATORY_CARE_PROVIDER_SITE_OTHER): Payer: 59 | Admitting: Physician Assistant

## 2016-06-05 ENCOUNTER — Encounter: Payer: Self-pay | Admitting: Physician Assistant

## 2016-06-05 DIAGNOSIS — G43809 Other migraine, not intractable, without status migrainosus: Secondary | ICD-10-CM | POA: Diagnosis not present

## 2016-06-05 DIAGNOSIS — Z113 Encounter for screening for infections with a predominantly sexual mode of transmission: Secondary | ICD-10-CM | POA: Diagnosis not present

## 2016-06-05 DIAGNOSIS — Z124 Encounter for screening for malignant neoplasm of cervix: Secondary | ICD-10-CM

## 2016-06-05 DIAGNOSIS — N200 Calculus of kidney: Secondary | ICD-10-CM

## 2016-06-05 DIAGNOSIS — G4700 Insomnia, unspecified: Secondary | ICD-10-CM

## 2016-06-05 DIAGNOSIS — Z79899 Other long term (current) drug therapy: Secondary | ICD-10-CM | POA: Diagnosis not present

## 2016-06-05 DIAGNOSIS — R6889 Other general symptoms and signs: Secondary | ICD-10-CM | POA: Diagnosis not present

## 2016-06-05 DIAGNOSIS — E785 Hyperlipidemia, unspecified: Secondary | ICD-10-CM

## 2016-06-05 DIAGNOSIS — F988 Other specified behavioral and emotional disorders with onset usually occurring in childhood and adolescence: Secondary | ICD-10-CM

## 2016-06-05 DIAGNOSIS — E559 Vitamin D deficiency, unspecified: Secondary | ICD-10-CM | POA: Diagnosis not present

## 2016-06-05 DIAGNOSIS — I10 Essential (primary) hypertension: Secondary | ICD-10-CM

## 2016-06-05 DIAGNOSIS — Z136 Encounter for screening for cardiovascular disorders: Secondary | ICD-10-CM

## 2016-06-05 DIAGNOSIS — R7309 Other abnormal glucose: Secondary | ICD-10-CM | POA: Diagnosis not present

## 2016-06-05 DIAGNOSIS — Z0001 Encounter for general adult medical examination with abnormal findings: Secondary | ICD-10-CM | POA: Diagnosis not present

## 2016-06-05 DIAGNOSIS — F419 Anxiety disorder, unspecified: Secondary | ICD-10-CM

## 2016-06-05 DIAGNOSIS — F3341 Major depressive disorder, recurrent, in partial remission: Secondary | ICD-10-CM

## 2016-06-05 DIAGNOSIS — N898 Other specified noninflammatory disorders of vagina: Secondary | ICD-10-CM | POA: Diagnosis not present

## 2016-06-05 LAB — HEPATIC FUNCTION PANEL
ALK PHOS: 72 U/L (ref 33–115)
ALT: 12 U/L (ref 6–29)
AST: 13 U/L (ref 10–35)
Albumin: 4 g/dL (ref 3.6–5.1)
BILIRUBIN INDIRECT: 0.2 mg/dL (ref 0.2–1.2)
Bilirubin, Direct: 0.1 mg/dL (ref <=0.2)
TOTAL PROTEIN: 6.7 g/dL (ref 6.1–8.1)
Total Bilirubin: 0.3 mg/dL (ref 0.2–1.2)

## 2016-06-05 LAB — TSH: TSH: 1.39 mIU/L

## 2016-06-05 LAB — CBC WITH DIFFERENTIAL/PLATELET
BASOS PCT: 0 %
Basophils Absolute: 0 cells/uL (ref 0–200)
EOS PCT: 3 %
Eosinophils Absolute: 228 cells/uL (ref 15–500)
HEMATOCRIT: 38.1 % (ref 35.0–45.0)
HEMOGLOBIN: 11.8 g/dL (ref 11.7–15.5)
LYMPHS ABS: 1368 {cells}/uL (ref 850–3900)
LYMPHS PCT: 18 %
MCH: 26.7 pg — ABNORMAL LOW (ref 27.0–33.0)
MCHC: 31 g/dL — AB (ref 32.0–36.0)
MCV: 86.2 fL (ref 80.0–100.0)
MONO ABS: 532 {cells}/uL (ref 200–950)
MPV: 9.6 fL (ref 7.5–12.5)
Monocytes Relative: 7 %
NEUTROS PCT: 72 %
Neutro Abs: 5472 cells/uL (ref 1500–7800)
Platelets: 263 10*3/uL (ref 140–400)
RBC: 4.42 MIL/uL (ref 3.80–5.10)
RDW: 16 % — AB (ref 11.0–15.0)
WBC: 7.6 10*3/uL (ref 3.8–10.8)

## 2016-06-05 LAB — BASIC METABOLIC PANEL WITH GFR
BUN: 12 mg/dL (ref 7–25)
CALCIUM: 8.9 mg/dL (ref 8.6–10.2)
CO2: 25 mmol/L (ref 20–31)
Chloride: 108 mmol/L (ref 98–110)
Creat: 0.78 mg/dL (ref 0.50–1.10)
GFR, Est African American: >89 mL/min (ref >=60)
GLUCOSE: 82 mg/dL (ref 65–99)
POTASSIUM: 4.1 mmol/L (ref 3.5–5.3)
Sodium: 141 mmol/L (ref 135–146)

## 2016-06-05 LAB — LIPID PANEL
Cholesterol: 183 mg/dL (ref 125–200)
HDL: 44 mg/dL — ABNORMAL LOW (ref >=46)
LDL CALC: 119 mg/dL (ref <130)
Total CHOL/HDL Ratio: 4.2 Ratio (ref <=5.0)
Triglycerides: 100 mg/dL (ref <150)
VLDL: 20 mg/dL (ref <30)

## 2016-06-05 LAB — MAGNESIUM: MAGNESIUM: 1.9 mg/dL (ref 1.5–2.5)

## 2016-06-05 MED ORDER — LISDEXAMFETAMINE DIMESYLATE 60 MG PO CAPS: 60.0000 mg | ORAL_CAPSULE | ORAL | 0 refills | Status: DC

## 2016-06-05 MED ORDER — SERTRALINE HCL 100 MG PO TABS: 100.0000 mg | ORAL_TABLET | Freq: Every day | ORAL | 0 refills | Status: DC

## 2016-06-05 MED FILL — VYVANSE 60 MG CAPSULE: 60 | 30 days supply | Qty: 30 | Fill #0

## 2016-06-05 MED FILL — SERTRALINE HCL 100 MG TAB: 100 | 90 days supply | Qty: 90 | Fill #0

## 2016-06-05 NOTE — Patient Instructions (Addendum)
[facility]  7 a.m.-6:30 p.m., Monday 7 a.m.-5 p.m., Tuesday-Friday Schedule an appointment by calling [phone].  [facility] Schedule an appointment by calling [phone].  Encourage you to get the 3D Mammogram  The 3D Mammogram is much more specific and sensitive to pick up breast cancer. For women with fibrocystic breast or lumpy breast it can be hard to determine if it is cancer or not but the 3D mammogram is able to tell this difference which cuts back on unneeded additional tests or scary call backs.   - over 40% increase in detection of breast cancer - over 40% reduction in false positives.  - fewer call backs - reduced anxiety - improved outcomes - [facility]  Common causes of cough OR hoarseness OR sore throat:   Allergies, Viral Infections, Acid Reflux and Bacterial Infections.  1) Allergies and viral infections cause a cough OR sore throat by post nasal drip and are often worse at night, can also have sneezing, lower grade fevers, clear/yellow mucus. This is best treated with allergy medications or nasal sprays.  Please get on allegra for 1-2 weeks The strongest is allegra or fexafinadine  Cheapest at [employer], [employer], [employer]  2) Bacterial infections are more severe than allergies or viral infections with fever, teeth pain, fatigue. This can be treated with prednisone and the same over the counter medication and after 7 days can be treated with an antibiotic.   3) Silent reflux/GERD can cause a cough OR sore throat OR hoarseness WITHOUT heart burn because the esophagus that goes to the stomach and trachea that goes to the lungs are very close and when you lay down the acid can irritate your throat and lungs. This can cause hoarseness, cough, and wheezing. Please stop any alcohol or anti-inflammatories like aleve/advil/ibuprofen and start an over the counter Prilosec or omeprazole 1-2 times daily 30mins before food for 2 weeks, then  switch to over the counter zantac/ratinidine or pepcid/famotadine once at night for 2 weeks.   4) sometimes irritation causes more irritation. Try voice rest, use sugar free cough drops to prevent coughing, and try to stop clearing your throat.   If you ever have a cough that does not go away after trying these things please make a follow up visit for further evaluation or we can refer you to a specialist. Or if you ever have shortness of breath or chest pain go to the ER.    We want weight loss that will last so you should lose 1-2 pounds a week.  THAT IS IT! Please pick THREE things a month to change. Once it is a habit check off the item. Then pick another three items off the list to become habits.  If you are already doing a habit on the list GREAT!  Cross that item off! o Don't drink your calories. Ie, alcohol, soda, fruit juice, and sweet tea.  o Drink more water. Drink a glass when you feel hungry or before each meal.  o Eat breakfast - Complex carb and protein (likeDannon light and fit yogurt, oatmeal, fruit, eggs, [place]key bacon). o Measure your cereal.  Eat no more than one cup a day. (ie [place]) o Eat an apple a day. o Add a vegetable a day. o Try a new vegetable a month. o Use Pam! Stop using oil or butter to cook. o Don't finish your plate or use smaller plates. o Share your dessert. o Eat sugar free Jello for dessert  or frozen grapes. o Don't eat 2-3 hours before bed. o Switch to whole wheat bread, pasta, and brown rice. o Make healthier choices when you eat out. No fries! o Pick baked chicken, NOT fried. o Don't forget to SLOW DOWN when you eat. It is not going anywhere.  o Take the stairs. o Park far away in the parking lot o [employer] (or weights) for 10 minutes while watching TV. o Walk at work for 10 minutes during break. o Walk outside 1 time a week with your friend, kids, dog, or significant other. o Start a walking group at church. o Walk the mall as much as you  can tolerate.  o Keep a food diary. o Weigh yourself daily. o Walk for 15 minutes 3 days per week. o Cook at home more often and eat out less.  If life happens and you go back to old habits, it is okay.  Just start over. You can do it!   If you experience chest pain, get short of breath, or tired during the exercise, please stop immediately and inform your doctor.

## 2016-06-05 NOTE — Progress Notes (Signed)
Complete Physical  Assessment and Plan:  Morbid obesity (BMI 40.37) - long discussion about weight loss, diet, and exercise -     TSH  Other abnormal glucose Discussed general issues about diabetes pathophysiology and management., Educational material distributed., Suggested low cholesterol diet., Encouraged aerobic exercise., Discussed foot care., Reminded to get yearly retinal exam. -     TSH -     Hemoglobin A1c -     Urinalysis, Routine w reflex microscopic (not at [facility]) -     Microalbumin / creatinine urine ratio -     EKG 12-Lead  Other migraine without status migrainosus, not intractable  continue meds, avoid triggers  Kidney stones Increase fluids  Anxiety -     sertraline (ZOLOFT) 100 MG tablet; Take 1 tablet (100 mg total) by mouth daily.  Insomnia, unspecified type Continue medications  Recurrent major depressive disorder, in partial remission (HCC) -     lisdexamfetamine (VYVANSE) 60 MG capsule; Take 1 capsule (60 mg total) by mouth every morning. -     sertraline (ZOLOFT) 100 MG tablet; Take 1 tablet (100 mg total) by mouth daily.  Vitamin D deficiency -     VITAMIN D 25 Hydroxy (Vit-D Deficiency, Fractures)  Vaginal discharge - check labs -     GC/Chlamydia Probe Amp -     WET PREP BY MOLECULAR PROBE  Screen for STD (sexually transmitted disease) -     HIV antibody -     Hepatitis C antibody -     RPR -     HSV(herpes simplex vrs) 1+2 ab-IgG  Medication management -     CBC with Differential/Platelet -     BASIC METABOLIC PANEL WITH GFR -     Hepatic function panel -     Magnesium  Encounter for general adult medical examination with abnormal findings -     lisdexamfetamine (VYVANSE) 60 MG capsule; Take 1 capsule (60 mg total) by mouth every morning. -     sertraline (ZOLOFT) 100 MG tablet; Take 1 tablet (100 mg total) by mouth daily. -     CBC with Differential/Platelet -     BASIC METABOLIC PANEL WITH GFR -     Hepatic function panel -      TSH -     Lipid panel -     Hemoglobin A1c -     Magnesium -     VITAMIN D 25 Hydroxy (Vit-D Deficiency, Fractures) -     Urinalysis, Routine w reflex microscopic (not at [facility]) -     Microalbumin / creatinine urine ratio -     EKG 12-Lead -     HIV antibody -     Hepatitis C antibody -     RPR -     HSV(herpes simplex vrs) 1+2 ab-IgG  Attention deficit disorder, unspecified hyperactivity presence -     lisdexamfetamine (VYVANSE) 60 MG capsule; Take 1 capsule (60 mg total) by mouth every morning.  Hyperlipidemia, unspecified hyperlipidemia type -     Lipid panel -     EKG 12-Lead   Discussed med's effects and SE's. Screening labs and tests as requested with regular follow-up as recommended. Over 40 minutes of exam, counseling, chart review, and complex, high level critical decision making was performed this visit.   HPI  45 y.o. female  presents for a complete physical and follow up for has Anxiety; Migraines; Kidney stones; Insomnia; Depression; Morbid obesity (BMI 40.37); Gonalgia; Other abnormal glucose; Vitamin D deficiency; and  Abdominal pain on her problem list..  Her blood pressure has been controlled at home, today their BP is BP: 126/74 She does not workout. She denies chest pain, shortness of breath, dizziness.   She is not on cholesterol medication and denies myalgias. Her cholesterol is not at goal. The cholesterol last visit was:   Lab Results  Component Value Date   CHOL 210 (H) 10/04/2015   HDL 41 (L) 10/04/2015   LDLCALC 140 (H) 10/04/2015   TRIG 144 10/04/2015   CHOLHDL 5.1 (H) 10/04/2015   She has been working on diet and exercise for prediabetes, and denies hypoglycemia , paresthesia of the feet, polydipsia, polyuria and visual disturbances. Last A1C in the office was:  Lab Results  Component Value Date   HGBA1C 5.6 10/04/2015   Patient is on Vitamin D supplement.   Lab Results  Component Value Date   VD25OH 15 (L) 10/04/2015     BMI is Body mass  index is 41.9 kg/m., she is working on diet and exercise. Wt Readings from Last 3 Encounters:  06/05/16 251 lb 12.8 oz (114.2 kg)  10/12/15 254 lb (115.2 kg)  10/04/15 258 lb 3.2 oz (117.1 kg)   Patient is on an ADD medication, she states that the medication is helping and she denies any adverse reactions. She is doing very well on the vyvanse, very good focus without physical symptoms however she would have to take a second one around 1-2 pm.  She has anxiety/depression controlled with zoloft, xanax 2 at night and on trazodone at night , has been off zoloft so has been having some decreased motivation. She has been having a lot of burning, itching, has had one new partner, has brownish discharge.   Current Medications:  Current Outpatient Prescriptions on File Prior to Visit  Medication Sig Dispense Refill  . ALPRAZolam (XANAX) 1 MG tablet TAKE 1/2 TO 1 TABLET BY MOUTH THREE TIMES DAILY AS NEEDED 90 tablet 0  . Cholecalciferol (VITAMIN D PO) Take by mouth.    . lisdexamfetamine (VYVANSE) 40 MG capsule Take 1 capsule (40 mg total) by mouth every morning. 30 capsule 0  . metroNIDAZOLE (METROGEL) 0.75 % vaginal gel Place 1 Applicatorful vaginally at bedtime. 1 applicator at night for 5 days 70 g 0  . ranitidine (ZANTAC) 300 MG tablet Take twice a day for 2 weeks, then can go to once at night 60 tablet 3  . sertraline (ZOLOFT) 100 MG tablet TAKE 1 TABLET BY MOUTH EVERY DAY 30 tablet 0  . traZODone (DESYREL) 150 MG tablet TAKE 1 TABLET BY MOUTH AT BEDTIME 30 tablet 0   No current facility-administered medications on file prior to visit.    Allergies:  Allergies  Allergen Reactions  . Sulfa Antibiotics Itching  . Amoxicillin Rash  . Penicillins Rash   Medical History:  She has Anxiety; Migraines; Kidney stones; Insomnia; Depression; Morbid obesity (BMI 40.37); Gonalgia; Other abnormal glucose; Vitamin D deficiency; and Abdominal pain on her problem list.   Health Maintenance:    Immunization History  Administered Date(s) Administered  . Pneumococcal-Unspecified 09/02/1996  . Td 10/05/2002   Tetanus:2004 DUE Pneumovax: 1998 Prevnar 13:  Flu vaccine: 2017 at work Zostavax:  Patient's last menstrual period was 05/06/2016 (approximate). Pap: 10/2015 Dr. Audie Box MGM: never had MGM, needs  DEXA: N/A Colonoscopy: N/A EGD: N/A CXR 2016  Patient Care Team: Quentin Mulling, PA-C as PCP - General (Physician Assistant)  Surgical History:  She has a past surgical history that  includes Cesarean section and Pelvic laparoscopy (2004). Family History:  Herfamily history includes ADD / ADHD in her daughter; Autism in her son; Celiac disease in her sister; Depression in her father; Hypertension in her mother; Migraines in her father and mother. Social History:  She reports that she quit smoking about 13 years ago. She does not have any smokeless tobacco history on file. She reports that she drinks alcohol. She reports that she does not use drugs.  Review of Systems: Review of Systems  Constitutional: Negative.   HENT: Negative.   Eyes: Negative.   Respiratory: Negative.   Cardiovascular: Negative.   Gastrointestinal: Negative.   Genitourinary: Negative.        + vaginal discharge, itching  Musculoskeletal: Negative.   Skin: Negative.   Neurological: Negative.   Endo/Heme/Allergies: Negative.   Psychiatric/Behavioral: Negative.     Physical Exam: Estimated body mass index is 41.9 kg/m as calculated from the following:   Height as of this encounter: 5\' 5"  (1.651 m).   Weight as of this encounter: 251 lb 12.8 oz (114.2 kg). BP 126/74   Pulse 91   Temp 97.7 F (36.5 C)   Resp 16   Ht 5\' 5"  (1.651 m)   Wt 251 lb 12.8 oz (114.2 kg)   LMP 05/06/2016 (Approximate)   SpO2 96%   BMI 41.90 kg/m  General Appearance: Well nourished, in no apparent distress.  Eyes: PERRLA, EOMs, conjunctiva no swelling or erythema, normal fundi and vessels.  Sinuses: No  Frontal/maxillary tenderness  ENT/Mouth: Ext aud canals clear, normal light reflex with TMs without erythema, bulging. Good dentition. No erythema, swelling, or exudate on post pharynx. Tonsils not swollen or erythematous. Hearing normal.  Neck: Supple, thyroid normal. No bruits  Respiratory: Respiratory effort normal, BS equal bilaterally without rales, rhonchi, wheezing or stridor.  Cardio: RRR without murmurs, rubs or gallops. Brisk peripheral pulses without edema.  Chest: symmetric, with normal excursions and percussion.  Breasts: Symmetric, without lumps, nipple discharge, retractions.  Abdomen: Soft, nontender, no guarding, rebound, hernias, masses, or organomegaly.  Lymphatics: Non tender without lymphadenopathy.  Genitourinary: VULVA: vulvar erythema and mild swelling, VAGINA: vaginal erythema , vaginal discharge - white and curd-like, CERVIX: normal appearing cervix without discharge or lesions. Musculoskeletal: Full ROM all peripheral extremities,5/5 strength, and normal gait.  Skin: Warm, dry without rashes, lesions, ecchymosis. Neuro: Cranial nerves intact, reflexes equal bilaterally. Normal muscle tone, no cerebellar symptoms. Sensation intact.  Psych: Awake and oriented X 3, normal affect, Insight and Judgment appropriate.   EKG: WNL no ST changes. AORTA SCAN: defer  Quentin MullingAmanda Axzel [place] 9:25 AM [facility]

## 2016-06-06 ENCOUNTER — Other Ambulatory Visit: Payer: Self-pay | Admitting: Physician Assistant

## 2016-06-06 DIAGNOSIS — Z1231 Encounter for screening mammogram for malignant neoplasm of breast: Secondary | ICD-10-CM

## 2016-06-06 DIAGNOSIS — IMO0001 Reserved for inherently not codable concepts without codable children: Secondary | ICD-10-CM

## 2016-06-06 LAB — URINALYSIS, MICROSCOPIC ONLY
Casts: NONE SEEN [LPF]
Crystals: NONE SEEN [HPF]

## 2016-06-06 LAB — URINALYSIS, ROUTINE W REFLEX MICROSCOPIC
Bilirubin Urine: NEGATIVE
GLUCOSE, UA: NEGATIVE
KETONES UR: NEGATIVE
Nitrite: NEGATIVE
PH: 6 (ref 5.0–8.0)
Protein, ur: NEGATIVE
Specific Gravity, Urine: 1.021 (ref 1.001–1.035)

## 2016-06-06 LAB — WET PREP BY MOLECULAR PROBE
Candida species: POSITIVE — AB
GARDNERELLA VAGINALIS: NEGATIVE
TRICHOMONAS VAG: NEGATIVE

## 2016-06-06 LAB — GC/CHLAMYDIA PROBE AMP
CT PROBE, AMP APTIMA: NOT DETECTED
GC Probe RNA: NOT DETECTED

## 2016-06-06 LAB — HSV(HERPES SIMPLEX VRS) I + II AB-IGG: HSV 2 Glycoprotein G Ab, IgG: <0.9 Index (ref <0.90)

## 2016-06-06 LAB — HIV ANTIBODY (ROUTINE TESTING W REFLEX): HIV 1&2 Ab, 4th Generation: NONREACTIVE

## 2016-06-06 LAB — VITAMIN D 25 HYDROXY (VIT D DEFICIENCY, FRACTURES): Vit D, 25-Hydroxy: 36 ng/mL (ref 30–100)

## 2016-06-06 LAB — MICROALBUMIN / CREATININE URINE RATIO
Creatinine, Urine: 141 mg/dL (ref 20–320)
Microalb, Ur: <0.2 mg/dL

## 2016-06-06 LAB — HEPATITIS C ANTIBODY: HCV Ab: NEGATIVE

## 2016-06-06 LAB — HEMOGLOBIN A1C
Hgb A1c MFr Bld: 5.2 % (ref <5.7)
MEAN PLASMA GLUCOSE: 103 mg/dL

## 2016-06-06 LAB — RPR

## 2016-06-06 MED ORDER — FLUCONAZOLE 150 MG PO TABS: 150.0000 mg | ORAL_TABLET | Freq: Every day | ORAL | 3 refills | Status: DC

## 2016-06-06 MED FILL — FLUCONAZOLE 150 MG TABLET: 150 | 1 days supply | Qty: 1 | Fill #0

## 2016-06-07 MED ORDER — TRAZODONE HCL 150 MG PO TABS: 150.0000 mg | ORAL_TABLET | Freq: Every day | ORAL | 3 refills | Status: DC

## 2016-06-07 MED FILL — traZODone HCL 150 MG TABS: 150 | 30 days supply | Qty: 30 | Fill #0

## 2016-06-07 NOTE — Telephone Encounter (Signed)
Rx called into [facility].

## 2016-06-08 LAB — URINE CULTURE

## 2016-06-10 ENCOUNTER — Ambulatory Visit: Payer: 59

## 2016-06-11 ENCOUNTER — Encounter: Payer: Self-pay | Admitting: Physician Assistant

## 2016-06-11 ENCOUNTER — Other Ambulatory Visit: Payer: Self-pay | Admitting: Physician Assistant

## 2016-06-11 MED ORDER — ALPRAZOLAM 1 MG PO TABS: 0.5000 mg | ORAL_TABLET | Freq: Three times a day (TID) | ORAL | 0 refills | Status: DC | PRN

## 2016-06-11 MED FILL — ALPRAZolam 1 MG TABS: 1 | 30 days supply | Qty: 90 | Fill #0

## 2016-06-11 NOTE — Telephone Encounter (Signed)
[facility] called [facility].

## 2016-06-12 ENCOUNTER — Ambulatory Visit (INDEPENDENT_AMBULATORY_CARE_PROVIDER_SITE_OTHER): Payer: 59 | Admitting: Internal Medicine

## 2016-06-12 ENCOUNTER — Encounter: Payer: Self-pay | Admitting: Internal Medicine

## 2016-06-12 DIAGNOSIS — J069 Acute upper respiratory infection, unspecified: Secondary | ICD-10-CM

## 2016-06-12 MED ORDER — AZITHROMYCIN 250 MG PO TABS: ORAL_TABLET | ORAL | 0 refills | Status: DC

## 2016-06-12 MED ORDER — BENZONATATE 200 MG PO CAPS: 200.0000 mg | ORAL_CAPSULE | Freq: Three times a day (TID) | ORAL | 1 refills | Status: DC | PRN

## 2016-06-12 MED FILL — BENZONATATE 200 MG CAPSULE: 200 | 10 days supply | Qty: 30 | Fill #0

## 2016-06-12 MED FILL — AZITHROMYCIN 250 MG TABLET: 250 | 5 days supply | Qty: 6 | Fill #0

## 2016-06-12 NOTE — Progress Notes (Signed)
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 Asked to be evaluated for acute onset cough. She feels that she caught respiratory infection 2 weeks ago, waking with cough productive yellow brown sputum, weakness, sore throat. No definite fever and no chills. GI okay. Had had flu vaccine shortly before this.  Took a breathing treatment yesterday and did have some relief, Trouble sleeping with the cough No definite history of asthma. Prior to Admission medications   Medication Sig Start Date End Date Taking? Authorizing Provider  ALPRAZolam Prudy Feeler) 1 MG tablet Take 0.5-1 tablets (0.5-1 mg total) by mouth 3 (three) times daily as needed. 06/11/16  Yes Quentin Mulling, PA-C  fluconazole (DIFLUCAN) 150 MG tablet Take 1 tablet (150 mg total) by mouth daily. 06/06/16  Yes Quentin Mulling, PA-C  lisdexamfetamine (VYVANSE) 60 MG capsule Take 1 capsule (60 mg total) by mouth every morning. 06/05/16  Yes Quentin Mulling, PA-C  ranitidine (ZANTAC) 300 MG tablet Take twice a day for 2 weeks, then can go to once at night 04/19/15  Yes Quentin Mulling, PA-C  sertraline (ZOLOFT) 100 MG tablet Take 1 tablet (100 mg total) by mouth daily. 06/05/16  Yes Quentin Mulling, PA-C  traZODone (DESYREL) 150 MG tablet Take 1 tablet (150 mg total) by mouth at bedtime. 06/07/16  Yes Quentin Mulling, PA-C  azithromycin (ZITHROMAX) 250 MG tablet 2 today then one daily 06/12/16   Waymon Budge, MD  benzonatate (TESSALON) 200 MG capsule Take 1 capsule (200 mg total) by mouth 3 (three) times daily as needed for cough. 06/12/16   Waymon Budge, MD   Past Medical History:  Diagnosis Date  . Anxiety   . Depression   . Endometriosis   . Insomnia   . Kidney stones   . Migraines    Past Surgical History:  Procedure Laterality Date  . CESAREAN SECTION     X 2  . PELVIC LAPAROSCOPY  2004   Laser Endometriosis   Family History  Problem Relation Age of Onset  . Migraines Mother   . Hypertension Mother   .  Migraines Father   . Depression Father   . Celiac disease Sister   . ADD / ADHD Daughter   . Autism Son    Social History   Social History  . Marital status: Single    Spouse name: N/A  . Number of children: N/A  . Years of education: N/A   Occupational History  . Not on file.   Social History Main Topics  . Smoking status: Former Smoker    Quit date: 05/19/2003  . Smokeless tobacco: Not on file  . Alcohol use 0.0 oz/week     Comment: occ  . Drug use: No  . Sexual activity: No     Comment: 1st intercourse 45 yo-More than 5 partners   Other Topics Concern  . Not on file   Social History Narrative  . No narrative on file   Family History  Problem Relation Age of Onset  . Migraines Mother   . Hypertension Mother   . Migraines Father   . Depression Father   . Celiac disease Sister   . ADD / ADHD Daughter   . Autism Son    ROS-see HPI   Negative unless "+" Constitutional:    weight loss, night sweats, fevers, chills, fatigue, lassitude. HEENT:    headaches, difficulty swallowing, tooth/dental problems, sore throat,       sneezing, itching, ear ache, nasal congestion, post nasal drip, snoring CV:  chest pain, orthopnea, PND, swelling in lower extremities, anasarca,                                             dizziness, palpitations Resp:   shortness of breath with exertion or at rest.                +productive cough,   non-productive cough, coughing up of blood.              +change in color of mucus.  wheezing.   Skin:    rash or lesions. GI:  No-   heartburn, indigestion, abdominal pain, nausea, vomiting, diarrhea,                 change in bowel habits, loss of appetite GU: dysuria, change in color of urine, no urgency or frequency.   flank pain. MS:   joint pain, stiffness, decreased range of motion, back pain. Neuro-     nothing unusual Psych:  change in mood or affect.  depression or anxiety.   memory loss.  OBJ- Physical Exam General- Alert, Oriented,  Affect-appropriate, Distress- none acute Skin- rash-none, lesions- none, excoriation- none Lymphadenopathy- none Head- atraumatic            Eyes- Gross vision intact, PERRLA, conjunctivae and secretions clear            Ears- Hearing, canals-normal            Nose- Clear, no-Septal dev, mucus, polyps, erosion, perforation             Throat- Mallampati II , mucosa clear , drainage- none, tonsils- atrophic Neck- flexible , trachea midline, no stridor , thyroid nl, carotid no bruit Chest - symmetrical excursion , unlabored           Heart/CV- RRR , no murmur , no gallop  , no rub, nl s1 s2                           - JVD- none , edema- none, stasis changes- none, varices- none           Lung- clear to P&A, wheeze- none, cough+ light , dullness-none, rub- none           Chest wall-  Abd-  Br/ Gen/ Rectal- Not done, not indicated Extrem- cyanosis- none, clubbing, none, atrophy- none, strength- nl Neuro- grossly intact to observation

## 2016-06-12 NOTE — Assessment & Plan Note (Signed)
Nonspecific upper respiratory infection with no significant sinusitis but with tracheobronchitis component. She gives history of rash from penicillin as infant Plan-hydration, rest, symptomatic therapies, Z-Pak, Tessalon Perles.

## 2016-06-12 NOTE — Patient Instructions (Signed)
Scripts sent for Tessalon perles for cough and Z pak  Stay hydrated, use throat lozenges and comfort meds as needed

## 2016-06-27 MED FILL — FLUCONAZOLE 150 MG TABLET: 150 | 1 days supply | Qty: 1 | Fill #1

## 2016-07-08 ENCOUNTER — Other Ambulatory Visit: Payer: Self-pay | Admitting: Physician Assistant

## 2016-07-08 DIAGNOSIS — Z0001 Encounter for general adult medical examination with abnormal findings: Secondary | ICD-10-CM

## 2016-07-08 DIAGNOSIS — F3341 Major depressive disorder, recurrent, in partial remission: Secondary | ICD-10-CM

## 2016-07-08 DIAGNOSIS — F988 Other specified behavioral and emotional disorders with onset usually occurring in childhood and adolescence: Secondary | ICD-10-CM

## 2016-07-08 MED ORDER — LISDEXAMFETAMINE DIMESYLATE 60 MG PO CAPS: 60.0000 mg | ORAL_CAPSULE | ORAL | 0 refills | Status: DC

## 2016-07-08 NOTE — Telephone Encounter (Signed)
Rx called into [facility]

## 2016-07-11 ENCOUNTER — Other Ambulatory Visit: Payer: Self-pay | Admitting: Physician Assistant

## 2016-07-11 MED FILL — traZODone HCL 150 MG TABS: 150 | 30 days supply | Qty: 30 | Fill #1

## 2016-07-11 NOTE — Telephone Encounter (Signed)
Rx called into [facility].

## 2016-07-15 ENCOUNTER — Ambulatory Visit (INDEPENDENT_AMBULATORY_CARE_PROVIDER_SITE_OTHER): Payer: 59 | Admitting: Internal Medicine

## 2016-07-15 ENCOUNTER — Encounter: Payer: Self-pay | Admitting: Internal Medicine

## 2016-07-15 VITALS — BP 118/68 | HR 102 | Temp 98.2°F | Ht 64.5 in

## 2016-07-15 DIAGNOSIS — K529 Noninfective gastroenteritis and colitis, unspecified: Secondary | ICD-10-CM

## 2016-07-15 MED ORDER — PROMETHAZINE HCL 25 MG/ML IJ SOLN
25.0000 mg | Freq: Once | INTRAMUSCULAR | Status: AC
  Administered 2016-07-15: 25 mg via INTRAMUSCULAR

## 2016-07-15 MED ORDER — KETOROLAC TROMETHAMINE 30 MG/ML IJ SOLN
30.0000 mg | Freq: Once | INTRAMUSCULAR | Status: AC
  Administered 2016-07-15: 30 mg via INTRAMUSCULAR

## 2016-07-15 MED ORDER — PROMETHAZINE HCL 25 MG PO TABS: 25.0000 mg | ORAL_TABLET | Freq: Three times a day (TID) | ORAL | 0 refills | Status: DC | PRN

## 2016-07-15 MED ORDER — DICYCLOMINE HCL 20 MG PO TABS: 20.0000 mg | ORAL_TABLET | Freq: Three times a day (TID) | ORAL | 0 refills | Status: DC

## 2016-07-15 MED FILL — PROMETHAZINE 25 MG TABLET: 25 | 20 days supply | Qty: 60 | Fill #0

## 2016-07-15 MED FILL — DICYCLOMINE 20 MG TABLET: 20 | 30 days supply | Qty: 90 | Fill #0

## 2016-07-15 MED FILL — ALPRAZolam 1 MG TABS: 1 | 30 days supply | Qty: 90 | Fill #0

## 2016-07-15 NOTE — Progress Notes (Signed)
Subjective:    Patient ID: Marie PosnerShelby J Vivona, female    DOB: Dec 23, 1970, 45 y.o.   MRN: [id number]  HPI  Patient presents to the office for evaluation of headache, nausea, vomiting, and diarrhea.  She reports that on Thursday she developed severe headache that felt like a hangover.  She reports that the headache lasted until Saturday night.  She reports that she developed diarrhea, nausea, and vomiting.  She reports that Sunday she was having really bad diarrhea.  She reports that it was watery and loose.  She did have some abdominal tenderness to the touch which has improved some.  She had a bowel movement yesterday at 8:30 last night.  She has been able to keep down some fluids and also was able to keep down some chicken and stars soup.  She has not had any sick contacts recently.  She does work at a [employer].  She has seen some GI bugs at the office.     Review of Systems  Constitutional: Positive for chills and fatigue. Negative for diaphoresis and fever.  Respiratory: Negative for chest tightness and shortness of breath.   Gastrointestinal: Positive for abdominal pain, diarrhea, nausea and vomiting. Negative for anal bleeding, blood in stool and constipation.       No reflux or belching  Genitourinary: Negative for difficulty urinating, dysuria, flank pain, frequency and urgency.  Neurological: Positive for headaches. Negative for facial asymmetry.       Objective:   Physical Exam  Constitutional: She is oriented to person, place, and time. She appears well-developed and well-nourished. No distress.  HENT:  Head: Normocephalic.  Mouth/Throat: Mucous membranes are dry. No oropharyngeal exudate.  Eyes: Conjunctivae are normal. No scleral icterus.  Neck: Normal range of motion. Neck supple. No JVD present. No thyromegaly present.  Cardiovascular: Normal rate, regular rhythm, normal heart sounds and intact distal pulses.  Exam reveals no gallop and no friction rub.   No murmur  heard. Pulmonary/Chest: Effort normal and breath sounds normal. No respiratory distress. She has no wheezes. She has no rales. She exhibits no tenderness.  Abdominal: Soft. Normal appearance and bowel sounds are normal. She exhibits no distension and no mass. There is tenderness in the right lower quadrant, epigastric area, suprapubic area and left lower quadrant. There is no rebound, no guarding and no CVA tenderness.  Musculoskeletal: Normal range of motion.  Lymphadenopathy:    She has no cervical adenopathy.  Neurological: She is alert and oriented to person, place, and time.  Skin: Skin is warm and dry. She is not diaphoretic.  Psychiatric: She has a normal mood and affect. Her behavior is normal. Judgment and thought content normal.  Nursing note and vitals reviewed.   Vitals:   07/15/16 1548  BP: 118/68  Pulse: (!) 102  Temp: 98.2 F (36.8 C)          Assessment & Plan:    1. Acute gastroenteritis -rest -increase fluid  Intake -tylenol prn -can taking ranitidine for gastritis if desired -if intractable pain or intractable N/V patient to go to ER. - dicyclomine (BENTYL) 20 MG tablet; Take 1 tablet (20 mg total) by mouth 3 (three) times daily before meals.  Dispense: 90 tablet; Refill: 0 - promethazine (PHENERGAN) 25 MG tablet; Take 1 tablet (25 mg total) by mouth every 8 (eight) hours as needed for nausea or vomiting.  Dispense: 60 tablet; Refill: 0 - promethazine (PHENERGAN) injection 25 mg; Inject 1 mL (25 mg total) into the  muscle once. - ketorolac (TORADOL) 30 MG/ML injection 30 mg; Inject 1 mL (30 mg total) into the muscle once.

## 2016-07-15 NOTE — Patient Instructions (Signed)

## 2016-07-16 MED FILL — VYVANSE 60 MG CAPSULE: 60 | 30 days supply | Qty: 30 | Fill #0

## 2016-07-19 ENCOUNTER — Encounter: Payer: Self-pay | Admitting: Physician Assistant

## 2016-07-19 ENCOUNTER — Ambulatory Visit (INDEPENDENT_AMBULATORY_CARE_PROVIDER_SITE_OTHER): Payer: 59 | Admitting: Physician Assistant

## 2016-07-19 VITALS — BP 126/74 | HR 100 | Temp 97.5°F | Resp 16 | Ht 64.5 in | Wt 236.0 lb

## 2016-07-19 DIAGNOSIS — F3281 Premenstrual dysphoric disorder: Secondary | ICD-10-CM

## 2016-07-19 DIAGNOSIS — G43809 Other migraine, not intractable, without status migrainosus: Secondary | ICD-10-CM

## 2016-07-19 MED ORDER — SUMATRIPTAN SUCCINATE 100 MG PO TABS: 100.0000 mg | ORAL_TABLET | Freq: Once | ORAL | 2 refills | Status: DC | PRN

## 2016-07-19 MED ORDER — NORETHIN ACE-ETH ESTRAD-FE 1-20 MG-MCG PO TABS: 1.0000 | ORAL_TABLET | Freq: Every day | ORAL | 11 refills | Status: DC

## 2016-07-19 NOTE — Patient Instructions (Signed)
Add ENTERIC COATED low dose 81 mg Aspirin daily OR can do every other day if you have easy bruising to protect your heart and head. As well as to reduce risk of Colon Cancer by 20 %, Skin Cancer by 26 % , Melanoma by 46% and Pancreatic cancer by 60%  Starting on low dose estrogen, may need to increase Add bASA Can take imitrex as needed If this does not help may consider effexor.    Migraine Headache A migraine headache is an intense, throbbing pain on one side or both sides of the head. Migraines may also cause other symptoms, such as nausea, vomiting, and sensitivity to light and noise. What are the causes? Doing or taking certain things may also trigger migraines, such as:  Alcohol.  Smoking.  Medicines, such as:  Medicine used to treat chest pain (nitroglycerine).  Birth control pills.  Estrogen pills.  Certain blood pressure medicines.  Aged cheeses, chocolate, or caffeine.  Foods or drinks that contain nitrates, glutamate, aspartame, or tyramine.  Physical activity. Other things that may trigger a migraine include:  Menstruation.  Pregnancy.  Hunger.  Stress, lack of sleep, too much sleep, or fatigue.  Weather changes. What increases the risk? The following factors may make you more likely to experience migraine headaches:  Age. Risk increases with age.  Family history of migraine headaches.  Being Caucasian.  Depression and anxiety.  Obesity.  Being a woman.  Having a hole in the heart (patent foramen ovale) or other heart problems. What are the signs or symptoms? The main symptom of this condition is pulsating or throbbing pain. Pain may:  Happen in any area of the head, such as on one side or both sides.  Interfere with daily activities.  Get worse with physical activity.  Get worse with exposure to bright lights or loud noises. Other symptoms may include:  Nausea.  Vomiting.  Dizziness.  General sensitivity to bright lights, loud  noises, or smells. Before you get a migraine, you may get warning signs that a migraine is developing (aura). An aura may include:  Seeing flashing lights or having blind spots.  Seeing bright spots, halos, or zigzag lines.  Having tunnel vision or blurred vision.  Having numbness or a tingling feeling.  Having trouble talking.  Having muscle weakness. How is this diagnosed? A migraine headache can be diagnosed based on:  Your symptoms.  A physical exam.  Tests, such as CT scan or MRI of the head. These imaging tests can help rule out other causes of headaches.  Taking fluid from the spine (lumbar puncture) and analyzing it (cerebrospinal fluid analysis, or CSF analysis). How is this treated? A migraine headache is usually treated with medicines that:  Relieve pain.  Relieve nausea.  Prevent migraines from coming back. Treatment may also include:  Acupuncture.  Lifestyle changes like avoiding foods that trigger migraines. Follow these instructions at home: Medicines  Take over-the-counter and prescription medicines only as told by your health care provider.  Do not drive or use heavy machinery while taking prescription pain medicine.  To prevent or treat constipation while you are taking prescription pain medicine, your health care provider may recommend that you:  Drink enough fluid to keep your urine clear or pale yellow.  Take over-the-counter or prescription medicines.  Eat foods that are high in fiber, such as fresh fruits and vegetables, whole grains, and beans.  Limit foods that are high in fat and processed sugars, such as fried and sweet  foods. Lifestyle  Avoid alcohol use.  Do not use any products that contain nicotine or tobacco, such as cigarettes and e-cigarettes. If you need help quitting, ask your health care provider.  Get at least 8 hours of sleep every night.  Limit your stress. General instructions  Keep a journal to find out what may  trigger your migraine headaches. For example, write down:  What you eat and drink.  How much sleep you get.  Any change to your diet or medicines.  If you have a migraine:  Avoid things that make your symptoms worse, such as bright lights.  It may help to lie down in a dark, quiet room.  Do not drive or use heavy machinery.  Ask your health care provider what activities are safe for you while you are experiencing symptoms.  Keep all follow-up visits as told by your health care provider. This is important. Contact a health care provider if:  You develop symptoms that are different or more severe than your usual migraine symptoms. Get help right away if:  Your migraine becomes severe.  You have a fever.  You have a stiff neck.  You have vision loss.  Your muscles feel weak or like you cannot control them.  You start to lose your balance often.  You develop trouble walking.  You faint. This information is not intended to replace advice given to you by your health care provider. Make sure you discuss any questions you have with your health care provider. Document Released: 08/19/2005 Document Revised: 03/08/2016 Document Reviewed: 02/05/2016 Elsevier Interactive Patient Education  2017 [employer]

## 2016-07-19 NOTE — Progress Notes (Signed)
   Subjective:    Patient ID: Marie Ingram, female    DOB: 1971/08/02, 45 y.o.   MRN: [id number]  HPI 45 y.o. obese female presents for migraines. She states that they are worse around her menstrual periods. 1-2 days before her period she will have band around her head, will change sides for sharp pain, last 24 hours, sensitivity to light/sound, nausea. Denies vision changes, weakness, changes in speech. Will take tylenol or ibuprofen/excedrin migraine, will ease off but will not take away, will go rest/sleep it off. Will also have emotional lability during the time, has history of heavy menses, endometrioses, will have abnormal menses occ.   Blood pressure 126/74, pulse 100, temperature 97.5 F (36.4 C), resp. rate 16, height 5' 4.5" (1.638 m), weight 236 lb (107 kg), last menstrual period 07/07/2016, SpO2 96 %.  Medications Current Outpatient Prescriptions on File Prior to Visit  Medication Sig  . ALPRAZolam (XANAX) 1 MG tablet TAKE 1/2 TO 1 TABLET BY MOUTH THREE TIMES DAILY AS NEEDED  . dicyclomine (BENTYL) 20 MG tablet Take 1 tablet (20 mg total) by mouth 3 (three) times daily before meals.  Marland Kitchen. lisdexamfetamine (VYVANSE) 60 MG capsule Take 1 capsule (60 mg total) by mouth every morning.  . sertraline (ZOLOFT) 100 MG tablet Take 1 tablet (100 mg total) by mouth daily.  . traZODone (DESYREL) 150 MG tablet Take 1 tablet (150 mg total) by mouth at bedtime.   No current facility-administered medications on file prior to visit.     Problem list She has Anxiety; Migraines; Kidney stones; Insomnia; Depression; Morbid obesity (BMI 40.37); Gonalgia; Other abnormal glucose; Vitamin D deficiency; Abdominal pain; and Acute upper respiratory infection on her problem list.  Review of Systems  Constitutional: Positive for fatigue. Negative for activity change, appetite change, chills, diaphoresis, fever and unexpected weight change.  Eyes: Negative.   Respiratory: Negative.   Cardiovascular:  Negative.   Gastrointestinal: Negative.   Endocrine: Negative.   Genitourinary: Negative.   Musculoskeletal: Negative.   Neurological: Positive for headaches. Negative for dizziness, tremors, seizures, syncope, facial asymmetry, speech difficulty, weakness, light-headedness and numbness.  Hematological: Negative.   Psychiatric/Behavioral: Positive for dysphoric mood and sleep disturbance. Negative for agitation, behavioral problems, confusion, decreased concentration, hallucinations, self-injury and suicidal ideas. The patient is not nervous/anxious and is not hyperactive.        Objective:   Physical Exam  Constitutional: She appears well-developed and well-nourished.  HENT:  Head: Normocephalic and atraumatic.  Right Ear: External ear normal.  Nose: Right sinus exhibits no frontal sinus tenderness. Left sinus exhibits no frontal sinus tenderness.  Eyes: Conjunctivae and EOM are normal.  Neck: Normal range of motion. Neck supple.  + TMJ  Cardiovascular: Normal rate, regular rhythm, normal heart sounds and intact distal pulses.   Pulmonary/Chest: Effort normal and breath sounds normal. No respiratory distress. She has no wheezes.  Abdominal: Soft. Bowel sounds are normal.  obese  Lymphadenopathy:    She has no cervical adenopathy.  Skin: Skin is warm and dry.       Assessment & Plan:  Migraines with PMDD Will start on low dose estrogen BCP, add low dose ASA Sumatriptan for abortive HA If this does not help will consider effexor  Future Appointments Date Time Provider Department Center  07/23/2016 7:00 AM [facility]  09/12/2016 8:45 AM Quentin MullingAmanda Embry Manrique, PA-C [facility] None  06/10/2017 9:00 AM Quentin MullingAmanda Derrisha Foos, PA-C [facility] None

## 2016-07-23 ENCOUNTER — Ambulatory Visit
Admission: RE | Admit: 2016-07-23 | Discharge: 2016-07-23 | Disposition: A | Discharge status: Home / Self Care | Payer: 59 | Source: Ambulatory Visit | Attending: Physician Assistant | Admitting: Physician Assistant

## 2016-07-23 DIAGNOSIS — Z1231 Encounter for screening mammogram for malignant neoplasm of breast: Secondary | ICD-10-CM | POA: Diagnosis not present

## 2016-07-25 DIAGNOSIS — S93401A Sprain of unspecified ligament of right ankle, initial encounter: Secondary | ICD-10-CM | POA: Diagnosis not present

## 2016-07-29 ENCOUNTER — Ambulatory Visit: Payer: Self-pay | Admitting: Physician Assistant

## 2016-07-29 ENCOUNTER — Ambulatory Visit (INDEPENDENT_AMBULATORY_CARE_PROVIDER_SITE_OTHER): Payer: Self-pay

## 2016-07-29 ENCOUNTER — Encounter (INDEPENDENT_AMBULATORY_CARE_PROVIDER_SITE_OTHER): Payer: Self-pay | Admitting: Orthopedic Surgery

## 2016-07-29 ENCOUNTER — Ambulatory Visit (INDEPENDENT_AMBULATORY_CARE_PROVIDER_SITE_OTHER): Payer: 59 | Admitting: Orthopedic Surgery

## 2016-07-29 VITALS — Ht 65.0 in | Wt 230.3 lb

## 2016-07-29 DIAGNOSIS — S93411A Sprain of calcaneofibular ligament of right ankle, initial encounter: Secondary | ICD-10-CM | POA: Diagnosis not present

## 2016-07-29 DIAGNOSIS — M25571 Pain in right ankle and joints of right foot: Secondary | ICD-10-CM | POA: Diagnosis not present

## 2016-07-29 NOTE — Progress Notes (Signed)
Office Visit Note   Patient: Marie Ingram           Date of Birth: 01/11/1971           MRN: [id number] Visit Date: 07/29/2016              Requested by: Quentin MullingAmanda Collier, PA-C [address] [place], [place] [id number] PCP: Quentin MullingAmanda Collier, PA-C   Assessment & Plan: Visit Diagnoses:  1. Sprain of calcaneofibular ligament of right ankle, initial encounter   2. Pain in right ankle and joints of right foot     Plan: Recommended that she wear the fracture boot for 2-3 weeks and then wear an ASO for the next 3-4 weeks. Increase her activities as tolerated follow-up if she has any persistent pain. She is given a note to park near work for the next 4 weeks.  Follow-Up Instructions: Return if symptoms worsen or fail to improve.   Orders:  Orders Placed This Encounter  Procedures  . XR Ankle Complete Right   No orders of the defined types were placed in this encounter.     Procedures: No procedures performed   Clinical Data: No additional findings.   Subjective: No chief complaint on file.   Patient presents for right ankle evaluation. She was sitting on right foot on Thanksgiving 07/25/16, she was getting up and foot was asleep causing her to buckle. She heard a loud pop and had acute onset of pain. She was immediately unable to weight bear. She was taken to a local urgent care by family. She states xrays were obtained and she was only told that they were negative for a fracture. She did not bring outside xrays with her to appointment today, new imaging studies ordered while she is in the office. She was giving a prescription for tramadol and ibuprofen which she feels are useless for pain relief. She had taken the boot off to sleep and woke up in the middle of the night with a burning pain posterior ankle. She does have swelling that has decreased since initial injury. She has pain with inversion and dorsiflexion.    Review of Systems   Objective: Vital  Signs: Ht 5\' 5"  (1.651 m)   Wt 230 lb 4.8 oz (104.5 kg)   LMP 07/07/2016   BMI 38.32 kg/m   Physical Exam on examination patient is alert oriented no adenopathy well-dressed normal affect normal respiratory effort she does have an antalgic gait she has good pulses no skin color or temperature changes. She does have some bruising and ecchymosis laterally. She is tender to palpation over the lateral ankle ligaments the deltoid is nontender to palpation the metatarsals are nontender to palpation no tenderness proximally over the tibia or fibula. Anterior drawer is stable.  Ortho Exam  Specialty Comments:  No specialty comments available.  Imaging: Xr Ankle Complete Right  Result Date: 07/29/2016 Three-view radiographs of the right ankle shows a congruent mortise no evidence of an ankle fracture. Lateral view of the foot shows no evidence of a fracture of the base of the fifth metatarsal. There is no widening of the mortise no syndesmotic injury.    PMFS History: Patient Active Problem List   Diagnosis Date Noted  . Pain in right ankle and joints of right foot 07/29/2016  . Acute upper respiratory infection 06/12/2016  . Other abnormal glucose 10/04/2015  . Vitamin D deficiency 10/04/2015  . Abdominal pain 10/04/2015  . Gonalgia 07/12/2014  . Morbid obesity (BMI  40.37) 05/27/2014  . Anxiety   . Migraines   . Kidney stones   . Insomnia   . Depression    Past Medical History:  Diagnosis Date  . Anxiety   . Depression   . Endometriosis   . Insomnia   . Kidney stones   . Migraines     Family History  Problem Relation Age of Onset  . Migraines Mother   . Hypertension Mother   . Migraines Father   . Depression Father   . Celiac disease Sister   . ADD / ADHD Daughter   . Autism Son     Past Surgical History:  Procedure Laterality Date  . CESAREAN SECTION     X 2  . PELVIC LAPAROSCOPY  2004   Laser Endometriosis   Social History   Occupational History  . Not on  file.   Social History Main Topics  . Smoking status: Former Smoker    Quit date: 05/19/2003  . Smokeless tobacco: Never Used  . Alcohol use 0.0 oz/week     Comment: occ  . Drug use: No  . Sexual activity: No     Comment: 1st intercourse 45 yo-More than 5 partners

## 2016-07-30 MED FILL — IBUPROFEN 800 MG TABLET: 800 | 10 days supply | Qty: 30 | Fill #0

## 2016-07-30 MED FILL — traMADol HCL 50 MG TABS: 50 | 7 days supply | Qty: 15 | Fill #0

## 2016-08-16 ENCOUNTER — Other Ambulatory Visit: Payer: Self-pay | Admitting: Physician Assistant

## 2016-08-16 DIAGNOSIS — Z0001 Encounter for general adult medical examination with abnormal findings: Secondary | ICD-10-CM

## 2016-08-16 DIAGNOSIS — F3341 Major depressive disorder, recurrent, in partial remission: Secondary | ICD-10-CM

## 2016-08-16 DIAGNOSIS — F988 Other specified behavioral and emotional disorders with onset usually occurring in childhood and adolescence: Secondary | ICD-10-CM

## 2016-08-16 MED ORDER — LISDEXAMFETAMINE DIMESYLATE 60 MG PO CAPS: 60.0000 mg | ORAL_CAPSULE | ORAL | 0 refills | Status: DC

## 2016-08-16 MED ORDER — ALPRAZOLAM 1 MG PO TABS: 0.5000 mg | ORAL_TABLET | Freq: Three times a day (TID) | ORAL | 0 refills | Status: DC | PRN

## 2016-08-16 MED FILL — VYVANSE 60 MG CAPSULE: 60 | 30 days supply | Qty: 30 | Fill #0

## 2016-08-19 MED FILL — ALPRAZolam 1 MG TABS: 1 | 30 days supply | Qty: 90 | Fill #0

## 2016-08-22 ENCOUNTER — Telehealth: Payer: Self-pay | Admitting: Internal Medicine

## 2016-08-22 MED ORDER — AZITHROMYCIN 250 MG PO TABS: ORAL_TABLET | ORAL | 0 refills | Status: DC

## 2016-08-22 MED FILL — AZITHROMYCIN 250 MG TABLET: 250 | 5 days supply | Qty: 6 | Fill #0

## 2016-08-22 NOTE — Telephone Encounter (Signed)
Acute URI, requests Zpak Rx Zpak

## 2016-09-11 MED FILL — FLUCONAZOLE 150 MG TABLET: 150 | 1 days supply | Qty: 1 | Fill #2

## 2016-09-11 MED FILL — traZODone HCL 150 MG TABS: 150 | 30 days supply | Qty: 30 | Fill #2

## 2016-09-11 MED FILL — SUMATRIPTAN SUCC 100 MG TAB: 100 | 30 days supply | Qty: 9 | Fill #0

## 2016-09-12 ENCOUNTER — Ambulatory Visit: Payer: Self-pay | Admitting: Physician Assistant

## 2016-09-16 ENCOUNTER — Other Ambulatory Visit: Payer: Self-pay | Admitting: Physician Assistant

## 2016-09-16 DIAGNOSIS — Z0001 Encounter for general adult medical examination with abnormal findings: Secondary | ICD-10-CM

## 2016-09-16 DIAGNOSIS — F3341 Major depressive disorder, recurrent, in partial remission: Secondary | ICD-10-CM

## 2016-09-16 DIAGNOSIS — F988 Other specified behavioral and emotional disorders with onset usually occurring in childhood and adolescence: Secondary | ICD-10-CM

## 2016-09-16 MED ORDER — ALPRAZOLAM 1 MG PO TABS: 0.5000 mg | ORAL_TABLET | Freq: Three times a day (TID) | ORAL | 0 refills | Status: DC | PRN

## 2016-09-16 MED ORDER — LISDEXAMFETAMINE DIMESYLATE 60 MG PO CAPS: 60.0000 mg | ORAL_CAPSULE | ORAL | 0 refills | Status: DC

## 2016-09-16 NOTE — Telephone Encounter (Signed)
Pt informed of Rx pick up in office

## 2016-09-17 MED FILL — ALPRAZolam 1 MG TABS: 1 | 30 days supply | Qty: 90 | Fill #0

## 2016-09-17 MED FILL — VYVANSE 60 MG CAPSULE: 60 | 30 days supply | Qty: 30 | Fill #0

## 2016-10-16 ENCOUNTER — Other Ambulatory Visit: Payer: Self-pay | Admitting: Physician Assistant

## 2016-10-16 ENCOUNTER — Encounter: Payer: Self-pay | Admitting: Physician Assistant

## 2016-10-16 DIAGNOSIS — F3341 Major depressive disorder, recurrent, in partial remission: Secondary | ICD-10-CM

## 2016-10-16 DIAGNOSIS — Z0001 Encounter for general adult medical examination with abnormal findings: Secondary | ICD-10-CM

## 2016-10-16 DIAGNOSIS — F988 Other specified behavioral and emotional disorders with onset usually occurring in childhood and adolescence: Secondary | ICD-10-CM

## 2016-10-22 ENCOUNTER — Other Ambulatory Visit: Payer: Self-pay | Admitting: Internal Medicine

## 2016-10-22 DIAGNOSIS — Z0001 Encounter for general adult medical examination with abnormal findings: Secondary | ICD-10-CM

## 2016-10-22 DIAGNOSIS — F988 Other specified behavioral and emotional disorders with onset usually occurring in childhood and adolescence: Secondary | ICD-10-CM

## 2016-10-22 DIAGNOSIS — F3341 Major depressive disorder, recurrent, in partial remission: Secondary | ICD-10-CM

## 2016-10-22 MED ORDER — LISDEXAMFETAMINE DIMESYLATE 60 MG PO CAPS: 60.0000 mg | ORAL_CAPSULE | ORAL | 0 refills | Status: DC

## 2016-10-22 MED ORDER — ALPRAZOLAM 1 MG PO TABS: 0.5000 mg | ORAL_TABLET | Freq: Three times a day (TID) | ORAL | 0 refills | Status: DC | PRN

## 2016-10-23 MED FILL — ALPRAZolam 1 MG TABS: 1 | 30 days supply | Qty: 90 | Fill #0

## 2016-10-23 MED FILL — VYVANSE 60 MG CAPSULE: 60 | 30 days supply | Qty: 30 | Fill #0

## 2016-10-23 MED FILL — traZODone HCL 150 MG TABS: 150 | 30 days supply | Qty: 30 | Fill #3

## 2016-11-25 ENCOUNTER — Other Ambulatory Visit: Payer: Self-pay | Admitting: Physician Assistant

## 2016-11-25 DIAGNOSIS — Z0001 Encounter for general adult medical examination with abnormal findings: Secondary | ICD-10-CM

## 2016-11-25 DIAGNOSIS — F988 Other specified behavioral and emotional disorders with onset usually occurring in childhood and adolescence: Secondary | ICD-10-CM

## 2016-11-25 DIAGNOSIS — F3341 Major depressive disorder, recurrent, in partial remission: Secondary | ICD-10-CM

## 2016-11-25 MED ORDER — TRAZODONE HCL 150 MG PO TABS: 150.0000 mg | ORAL_TABLET | Freq: Every day | ORAL | 3 refills | Status: DC

## 2016-11-25 MED ORDER — ALPRAZOLAM 1 MG PO TABS: 0.5000 mg | ORAL_TABLET | Freq: Three times a day (TID) | ORAL | 0 refills | Status: DC | PRN

## 2016-11-25 MED ORDER — LISDEXAMFETAMINE DIMESYLATE 60 MG PO CAPS: 60.0000 mg | ORAL_CAPSULE | ORAL | 0 refills | Status: DC

## 2016-11-26 MED FILL — VYVANSE 60 MG CAPSULE: 60 | 30 days supply | Qty: 30 | Fill #0

## 2016-11-26 MED FILL — traZODone HCL 150 MG TABS: 150 | 30 days supply | Qty: 30 | Fill #0

## 2016-11-26 MED FILL — ALPRAZolam 1 MG TABS: 1 | 30 days supply | Qty: 90 | Fill #0

## 2016-11-28 ENCOUNTER — Ambulatory Visit: Payer: Self-pay | Admitting: Physician Assistant

## 2016-12-29 ENCOUNTER — Encounter: Payer: Self-pay | Admitting: Physician Assistant

## 2016-12-29 ENCOUNTER — Other Ambulatory Visit: Payer: Self-pay | Admitting: Physician Assistant

## 2016-12-29 MED ORDER — OSELTAMIVIR PHOSPHATE 75 MG PO CAPS: 75.0000 mg | ORAL_CAPSULE | Freq: Every day | ORAL | 0 refills | Status: DC

## 2016-12-29 NOTE — Progress Notes (Signed)
amiflu  

## 2016-12-31 NOTE — Progress Notes (Signed)
6 month follow up  Assessment and Plan:  Morbid obesity (BMI 40.37) - long discussion about weight loss, diet, and exercise -     TSH  Recurrent major depressive disorder, in partial remission (HCC) -     lisdexamfetamine (VYVANSE) 60 MG capsule; Take 1 capsule (60 mg total) by mouth every morning. -     sertraline (ZOLOFT) 100 MG tablet; Take 1 tablet (100 mg total) by mouth daily.  Vitamin D deficiency -     VITAMIN D 25 Hydroxy (Vit-D Deficiency, Fractures)  Medication management -     CBC with Differential/Platelet -     BASIC METABOLIC PANEL WITH GFR -     Hepatic function panel -     Magnesium  Attention deficit disorder, unspecified hyperactivity presence -     lisdexamfetamine (VYVANSE) 60 MG capsule; Take 1 capsule (60 mg total) by mouth every morning.  Hyperlipidemia, unspecified hyperlipidemia type -     Lipid panel -     EKG 12-Lead  Insomnia Will try lunesta, goal is to decrease xanax use   Discussed med's effects and SE's. Screening labs and tests as requested with regular follow-up as recommended. Over 30 minutes of exam, counseling, chart review, and complex, high level critical decision making was performed this visit.  Future Appointments Date Time Provider Department Center  06/10/2017 9:00 AM Quentin Mulling, PA-C [facility] None    HPI  46 y.o. female  presents for follow up for has Anxiety; Migraines; Kidney stones; Insomnia; Depression; Morbid obesity (BMI 40.37); Gonalgia; Other abnormal glucose; Vitamin D deficiency; Abdominal pain; Acute upper respiratory infection; and Pain in right ankle and joints of right foot on her problem list..  Her blood pressure has been controlled at home, today their BP is BP: 110/82 She does not workout. She denies chest pain, shortness of breath, dizziness.   She is not on cholesterol medication and denies myalgias. Her cholesterol is not at goal. The cholesterol last visit was:   Lab Results  Component Value Date    CHOL 183 06/05/2016   HDL 44 (L) 06/05/2016   LDLCALC 119 06/05/2016   TRIG 100 06/05/2016   CHOLHDL 4.2 06/05/2016   She has been working on diet and exercise for prediabetes, and denies hypoglycemia , paresthesia of the feet, polydipsia, polyuria and visual disturbances. Last A1C in the office was:  Lab Results  Component Value Date   HGBA1C 5.2 06/05/2016   Patient is on Vitamin D supplement.   Lab Results  Component Value Date   VD25OH 36 06/05/2016     BMI is Body mass index is 37.86 kg/m., she is working on diet and exercise. Wt Readings from Last 3 Encounters:  01/01/17 224 lb (101.6 kg)  07/29/16 230 lb 4.8 oz (104.5 kg)  07/19/16 236 lb (107 kg)   Patient is on an ADD medication, she states that the medication is helping and she denies any adverse reactions. She is doing very well on the vyvanse, very good focus without physical symptoms however she would have to take a second one around 1-2 pm.  She has anxiety/depression controlled with zoloft, xanax 2 at night and on trazodone at night.   Current Medications:  Current Outpatient Prescriptions on File Prior to Visit  Medication Sig Dispense Refill  . ALPRAZolam (XANAX) 1 MG tablet Take 0.5-1 tablets (0.5-1 mg total) by mouth 3 (three) times daily as needed (should last longer than a month, do not take daily). 90 tablet 0  .  lisdexamfetamine (VYVANSE) 60 MG capsule Take 1 capsule (60 mg total) by mouth every morning. 30 capsule 0  . norethindrone-ethinyl estradiol (JUNEL FE,GILDESS FE,LOESTRIN FE) 1-20 MG-MCG tablet Take 1 tablet by mouth daily. 1 Package 11  . sertraline (ZOLOFT) 100 MG tablet Take 1 tablet (100 mg total) by mouth daily. 90 tablet 0  . SUMAtriptan (IMITREX) 100 MG tablet Take 1 tablet (100 mg total) by mouth once as needed for migraine. May repeat in 2 hours if headache persists or recurs. 10 tablet 2  . traZODone (DESYREL) 150 MG tablet Take 1 tablet (150 mg total) by mouth at bedtime. 30 tablet 3    No current facility-administered medications on file prior to visit.    Allergies:  Allergies  Allergen Reactions  . Sulfa Antibiotics Itching  . Amoxicillin Rash  . Penicillins Rash   Medical History:  She has Anxiety; Migraines; Kidney stones; Insomnia; Depression; Morbid obesity (BMI 40.37); Gonalgia; Other abnormal glucose; Vitamin D deficiency; Abdominal pain; Acute upper respiratory infection; and Pain in right ankle and joints of right foot on her problem list.    Review of Systems: Review of Systems  Constitutional: Negative.   HENT: Negative.   Eyes: Negative.   Respiratory: Negative.   Cardiovascular: Negative.   Gastrointestinal: Positive for heartburn (some RUQ pain).  Genitourinary: Negative.   Musculoskeletal: Negative.   Skin: Negative.   Neurological: Negative.   Endo/Heme/Allergies: Negative.   Psychiatric/Behavioral: Negative.     Physical Exam: Estimated body mass index is 37.86 kg/m as calculated from the following:   Height as of this encounter: 5' 4.5" (1.638 m).   Weight as of this encounter: 224 lb (101.6 kg). BP 110/82   Pulse 98   Temp 98.7 F (37.1 C)   Resp 16   Ht 5' 4.5" (1.638 m)   Wt 224 lb (101.6 kg)   LMP 12/11/2016   SpO2 98%   BMI 37.86 kg/m  General Appearance: Well nourished, in no apparent distress.  Eyes: PERRLA, EOMs, conjunctiva no swelling or erythema, normal fundi and vessels.  Sinuses: No Frontal/maxillary tenderness  ENT/Mouth: Ext aud canals clear, normal light reflex with TMs without erythema, bulging. Good dentition. No erythema, swelling, or exudate on post pharynx. Tonsils not swollen or erythematous. Hearing normal.  Neck: Supple, thyroid normal. No bruits  Respiratory: Respiratory effort normal, BS equal bilaterally without rales, rhonchi, wheezing or stridor.  Cardio: RRR without murmurs, rubs or gallops. Brisk peripheral pulses without edema.  Chest: symmetric, with normal excursions and percussion.   Abdomen: Soft, RUQ tender, no guarding, rebound, hernias, masses, or organomegaly.  Lymphatics: Non tender without lymphadenopathy.  Musculoskeletal: Full ROM all peripheral extremities,5/5 strength, and normal gait.  Skin: Warm, dry without rashes, lesions, ecchymosis. Neuro: Cranial nerves intact, reflexes equal bilaterally. Normal muscle tone, no cerebellar symptoms. Sensation intact.  Psych: Awake and oriented X 3, normal affect, Insight and Judgment appropriate.   Quentin Mulling 4:57 PM [facility]

## 2017-01-01 ENCOUNTER — Encounter: Payer: Self-pay | Admitting: Physician Assistant

## 2017-01-01 ENCOUNTER — Ambulatory Visit (INDEPENDENT_AMBULATORY_CARE_PROVIDER_SITE_OTHER): Payer: 59 | Admitting: Physician Assistant

## 2017-01-01 VITALS — BP 110/82 | HR 98 | Temp 98.7°F | Resp 16 | Ht 64.5 in | Wt 224.0 lb

## 2017-01-01 DIAGNOSIS — G43809 Other migraine, not intractable, without status migrainosus: Secondary | ICD-10-CM

## 2017-01-01 DIAGNOSIS — E785 Hyperlipidemia, unspecified: Secondary | ICD-10-CM

## 2017-01-01 DIAGNOSIS — F988 Other specified behavioral and emotional disorders with onset usually occurring in childhood and adolescence: Secondary | ICD-10-CM | POA: Diagnosis not present

## 2017-01-01 DIAGNOSIS — Z0001 Encounter for general adult medical examination with abnormal findings: Secondary | ICD-10-CM

## 2017-01-01 DIAGNOSIS — Z79899 Other long term (current) drug therapy: Secondary | ICD-10-CM

## 2017-01-01 DIAGNOSIS — F3341 Major depressive disorder, recurrent, in partial remission: Secondary | ICD-10-CM

## 2017-01-01 LAB — CBC WITH DIFFERENTIAL/PLATELET
Basophils Absolute: 0 cells/uL (ref 0–200)
Basophils Relative: 0 %
EOS ABS: 226 {cells}/uL (ref 15–500)
Eosinophils Relative: 2 %
HEMATOCRIT: 37.7 % (ref 35.0–45.0)
HEMOGLOBIN: 12.1 g/dL (ref 11.7–15.5)
LYMPHS ABS: 2486 {cells}/uL (ref 850–3900)
Lymphocytes Relative: 22 %
MCH: 27.4 pg (ref 27.0–33.0)
MCHC: 32.1 g/dL (ref 32.0–36.0)
MCV: 85.3 fL (ref 80.0–100.0)
MONO ABS: 791 {cells}/uL (ref 200–950)
MPV: 9.1 fL (ref 7.5–12.5)
Monocytes Relative: 7 %
Neutro Abs: 7797 cells/uL (ref 1500–7800)
Neutrophils Relative %: 69 %
Platelets: 332 10*3/uL (ref 140–400)
RBC: 4.42 MIL/uL (ref 3.80–5.10)
RDW: 15.7 % — ABNORMAL HIGH (ref 11.0–15.0)
WBC: 11.3 10*3/uL — AB (ref 3.8–10.8)

## 2017-01-01 LAB — TSH: TSH: 2.77 mIU/L

## 2017-01-01 MED ORDER — ESZOPICLONE 3 MG PO TABS: 3.0000 mg | ORAL_TABLET | Freq: Every evening | ORAL | 2 refills | Status: DC | PRN

## 2017-01-01 MED ORDER — ALPRAZOLAM 1 MG PO TABS: 0.5000 mg | ORAL_TABLET | Freq: Three times a day (TID) | ORAL | 0 refills | Status: DC | PRN

## 2017-01-01 MED ORDER — SUMATRIPTAN SUCCINATE 100 MG PO TABS: 100.0000 mg | ORAL_TABLET | Freq: Once | ORAL | 2 refills | Status: DC | PRN

## 2017-01-01 MED ORDER — LISDEXAMFETAMINE DIMESYLATE 60 MG PO CAPS: 60.0000 mg | ORAL_CAPSULE | ORAL | 0 refills | Status: DC

## 2017-01-01 MED FILL — ALPRAZolam 1 MG TABS: 1 | 30 days supply | Qty: 90 | Fill #0

## 2017-01-01 MED FILL — ESZOPICLONE 3 MG TABLET: 3 | 30 days supply | Qty: 30 | Fill #0

## 2017-01-01 MED FILL — VYVANSE 60 MG CAPSULE: 60 | 30 days supply | Qty: 30 | Fill #0

## 2017-01-01 NOTE — Patient Instructions (Signed)

## 2017-01-02 LAB — BASIC METABOLIC PANEL WITH GFR
BUN: 10 mg/dL (ref 7–25)
CALCIUM: 8.7 mg/dL (ref 8.6–10.2)
CO2: 21 mmol/L (ref 20–31)
Chloride: 106 mmol/L (ref 98–110)
Creat: 0.67 mg/dL (ref 0.50–1.10)
GFR, Est African American: >89 mL/min (ref >=60)
GLUCOSE: 76 mg/dL (ref 65–99)
POTASSIUM: 4.2 mmol/L (ref 3.5–5.3)
Sodium: 139 mmol/L (ref 135–146)

## 2017-01-02 LAB — HEPATIC FUNCTION PANEL
ALK PHOS: 72 U/L (ref 33–115)
ALT: 12 U/L (ref 6–29)
AST: 14 U/L (ref 10–35)
Albumin: 3.8 g/dL (ref 3.6–5.1)
Bilirubin, Direct: 0 mg/dL (ref <=0.2)
Indirect Bilirubin: 0.3 mg/dL (ref 0.2–1.2)
TOTAL PROTEIN: 6.2 g/dL (ref 6.1–8.1)
Total Bilirubin: 0.3 mg/dL (ref 0.2–1.2)

## 2017-01-02 LAB — LIPID PANEL
Cholesterol: 209 mg/dL — ABNORMAL HIGH (ref <200)
HDL: 48 mg/dL — ABNORMAL LOW (ref >50)
LDL CALC: 125 mg/dL — AB (ref <100)
Total CHOL/HDL Ratio: 4.4 Ratio (ref <5.0)
Triglycerides: 179 mg/dL — ABNORMAL HIGH (ref <150)
VLDL: 36 mg/dL — AB (ref <30)

## 2017-01-09 ENCOUNTER — Encounter: Payer: Self-pay | Admitting: Physician Assistant

## 2017-01-10 MED ORDER — TRAZODONE HCL 150 MG PO TABS: 150.0000 mg | ORAL_TABLET | Freq: Every day | ORAL | 3 refills | Status: DC

## 2017-01-10 MED FILL — traZODone HCL 150 MG TABS: 150 | 30 days supply | Qty: 30 | Fill #0

## 2017-01-13 MED FILL — SUMATRIPTAN SUCC 100 MG TAB: 100 | 30 days supply | Qty: 9 | Fill #0

## 2017-01-20 ENCOUNTER — Telehealth: Payer: Self-pay | Admitting: Physician Assistant

## 2017-01-20 ENCOUNTER — Encounter: Payer: Self-pay | Admitting: Physician Assistant

## 2017-01-20 MED ORDER — CIPROFLOXACIN HCL 500 MG PO TABS: 500.0000 mg | ORAL_TABLET | Freq: Two times a day (BID) | ORAL | 0 refills | Status: DC

## 2017-01-20 NOTE — Telephone Encounter (Signed)
Patient called requesting rx called to [facility] [address] for UTI. Please advise. Recomended patient MY Chart message you also.

## 2017-02-03 ENCOUNTER — Other Ambulatory Visit: Payer: Self-pay | Admitting: Physician Assistant

## 2017-02-03 DIAGNOSIS — Z0001 Encounter for general adult medical examination with abnormal findings: Secondary | ICD-10-CM

## 2017-02-03 DIAGNOSIS — F988 Other specified behavioral and emotional disorders with onset usually occurring in childhood and adolescence: Secondary | ICD-10-CM

## 2017-02-03 DIAGNOSIS — F3341 Major depressive disorder, recurrent, in partial remission: Secondary | ICD-10-CM

## 2017-02-03 MED ORDER — LISDEXAMFETAMINE DIMESYLATE 60 MG PO CAPS: 60.0000 mg | ORAL_CAPSULE | ORAL | 0 refills | Status: DC

## 2017-02-03 MED FILL — VYVANSE 60 MG CAPSULE: 60 | 30 days supply | Qty: 30 | Fill #0

## 2017-02-07 ENCOUNTER — Other Ambulatory Visit: Payer: Self-pay | Admitting: Physician Assistant

## 2017-02-07 MED ORDER — ALPRAZOLAM 1 MG PO TABS: 0.5000 mg | ORAL_TABLET | Freq: Three times a day (TID) | ORAL | 0 refills | Status: DC | PRN

## 2017-02-07 MED FILL — ALPRAZolam 1 MG TABS: 1 | 30 days supply | Qty: 90 | Fill #0

## 2017-02-13 MED FILL — SUMATRIPTAN SUCC 100 MG TAB: 100 | 30 days supply | Qty: 9 | Fill #1

## 2017-02-13 MED FILL — traZODone HCL 150 MG TABS: 150 | 30 days supply | Qty: 30 | Fill #1

## 2017-03-06 ENCOUNTER — Other Ambulatory Visit: Payer: Self-pay | Admitting: Physician Assistant

## 2017-03-06 DIAGNOSIS — F988 Other specified behavioral and emotional disorders with onset usually occurring in childhood and adolescence: Secondary | ICD-10-CM

## 2017-03-06 DIAGNOSIS — F3341 Major depressive disorder, recurrent, in partial remission: Secondary | ICD-10-CM

## 2017-03-06 DIAGNOSIS — Z0001 Encounter for general adult medical examination with abnormal findings: Secondary | ICD-10-CM

## 2017-03-06 MED ORDER — LISDEXAMFETAMINE DIMESYLATE 60 MG PO CAPS: 60.0000 mg | ORAL_CAPSULE | ORAL | 0 refills | Status: DC

## 2017-03-06 MED FILL — VYVANSE 60 MG CAPSULE: 60 | 30 days supply | Qty: 30 | Fill #0

## 2017-03-13 ENCOUNTER — Other Ambulatory Visit: Payer: Self-pay | Admitting: Internal Medicine

## 2017-03-13 ENCOUNTER — Other Ambulatory Visit: Payer: Self-pay | Admitting: Physician Assistant

## 2017-03-13 DIAGNOSIS — Z0001 Encounter for general adult medical examination with abnormal findings: Secondary | ICD-10-CM

## 2017-03-13 DIAGNOSIS — F3341 Major depressive disorder, recurrent, in partial remission: Secondary | ICD-10-CM

## 2017-03-13 DIAGNOSIS — F419 Anxiety disorder, unspecified: Secondary | ICD-10-CM

## 2017-03-14 MED ORDER — ALPRAZOLAM 1 MG PO TABS: 0.5000 mg | ORAL_TABLET | Freq: Three times a day (TID) | ORAL | 0 refills | Status: DC | PRN

## 2017-03-14 MED ORDER — SERTRALINE HCL 100 MG PO TABS: 100.0000 mg | ORAL_TABLET | Freq: Every day | ORAL | 0 refills | Status: DC

## 2017-03-14 MED FILL — ALPRAZolam 1 MG TABS: 1 | 25 days supply | Qty: 75 | Fill #0

## 2017-03-14 MED FILL — SERTRALINE HCL 100 MG TAB: 100 | 90 days supply | Qty: 90 | Fill #0

## 2017-03-14 NOTE — Telephone Encounter (Signed)
Xanax was called into pharmacy on 13th July 2018 @ 8:30am by DD

## 2017-03-25 ENCOUNTER — Encounter: Payer: Self-pay | Admitting: Physician Assistant

## 2017-03-25 MED ORDER — TRAZODONE HCL 150 MG PO TABS: 150.0000 mg | ORAL_TABLET | Freq: Every day | ORAL | 1 refills | Status: DC

## 2017-03-25 MED FILL — traZODone HCL 150 MG TABS: 150 | 90 days supply | Qty: 90 | Fill #0

## 2017-03-25 MED FILL — SUMATRIPTAN SUCC 100 MG TAB: 100 | 30 days supply | Qty: 9 | Fill #2

## 2017-04-09 ENCOUNTER — Ambulatory Visit (INDEPENDENT_AMBULATORY_CARE_PROVIDER_SITE_OTHER): Payer: 59 | Admitting: Physician Assistant

## 2017-04-09 ENCOUNTER — Encounter: Payer: Self-pay | Admitting: Physician Assistant

## 2017-04-09 VITALS — BP 120/70 | HR 92 | Temp 97.5°F | Resp 16 | Ht 64.5 in | Wt 225.6 lb

## 2017-04-09 DIAGNOSIS — R112 Nausea with vomiting, unspecified: Secondary | ICD-10-CM | POA: Diagnosis not present

## 2017-04-09 DIAGNOSIS — R197 Diarrhea, unspecified: Secondary | ICD-10-CM | POA: Diagnosis not present

## 2017-04-09 LAB — CBC WITH DIFFERENTIAL/PLATELET
Basophils Absolute: 0 cells/uL (ref 0–200)
Basophils Relative: 0 %
EOS ABS: 364 {cells}/uL (ref 15–500)
Eosinophils Relative: 4 %
HEMATOCRIT: 40.4 % (ref 35.0–45.0)
HEMOGLOBIN: 12.6 g/dL (ref 11.7–15.5)
LYMPHS PCT: 17 %
Lymphs Abs: 1547 cells/uL (ref 850–3900)
MCH: 27.3 pg (ref 27.0–33.0)
MCHC: 31.2 g/dL — AB (ref 32.0–36.0)
MCV: 87.4 fL (ref 80.0–100.0)
MONO ABS: 637 {cells}/uL (ref 200–950)
MPV: 9.1 fL (ref 7.5–12.5)
Monocytes Relative: 7 %
NEUTROS PCT: 72 %
Neutro Abs: 6552 cells/uL (ref 1500–7800)
Platelets: 291 10*3/uL (ref 140–400)
RBC: 4.62 MIL/uL (ref 3.80–5.10)
RDW: 15.9 % — AB (ref 11.0–15.0)
WBC: 9.1 10*3/uL (ref 3.8–10.8)

## 2017-04-09 MED ORDER — DICYCLOMINE HCL 10 MG PO CAPS: 10.0000 mg | ORAL_CAPSULE | Freq: Three times a day (TID) | ORAL | 0 refills | Status: DC | PRN

## 2017-04-09 MED ORDER — ONDANSETRON HCL 8 MG PO TABS: ORAL_TABLET | ORAL | 0 refills | Status: DC

## 2017-04-09 MED ORDER — PANTOPRAZOLE SODIUM 40 MG PO TBEC: 40.0000 mg | DELAYED_RELEASE_TABLET | Freq: Every day | ORAL | 0 refills | Status: DC

## 2017-04-09 NOTE — Progress Notes (Signed)
Subjective:    Patient ID: Marie Ingram, female    DOB: 1971-07-29, 46 y.o.   MRN: [id number]  HPI 46 y.o. WF presents with diarrhea/nausea. [place] the beach [place], had [place] that was in the car x 1 hour, then Monday woke up with nausea and vomiting at 3-4, diarrhea, stomach cramps, has had one and off x 1 week and now feel weak, no fever, chills, no one else sick. No recent ABX use. We have been cutting back on her xanax, she went from 3 a day to 1 a day.   Blood pressure 120/70, pulse 92, temperature (!) 97.5 F (36.4 C), resp. rate 16, height 5' 4.5" (1.638 m), weight 225 lb 9.6 oz (102.3 kg), last menstrual period 04/01/2017, SpO2 98 %.  Medications Current Outpatient Prescriptions on File Prior to Visit  Medication Sig  . ALPRAZolam (XANAX) 1 MG tablet Take 0.5-1 tablets (0.5-1 mg total) by mouth 3 (three) times daily as needed (should last longer than a month, do not take daily).  . ciprofloxacin (CIPRO) 500 MG tablet Take 1 tablet (500 mg total) by mouth 2 (two) times daily. 7 days  . lisdexamfetamine (VYVANSE) 60 MG capsule Take 1 capsule (60 mg total) by mouth every morning.  . sertraline (ZOLOFT) 100 MG tablet Take 1 tablet (100 mg total) by mouth daily.  . SUMAtriptan (IMITREX) 100 MG tablet Take 1 tablet (100 mg total) by mouth once as needed for migraine. May repeat in 2 hours if headache persists or recurs.  . traZODone (DESYREL) 150 MG tablet Take 1 tablet (150 mg total) by mouth at bedtime.   No current facility-administered medications on file prior to visit.     Problem list She has Anxiety; Migraines; Kidney stones; Insomnia; Depression; Morbid obesity (BMI 40.37); Gonalgia; Other abnormal glucose; Vitamin D deficiency; Abdominal pain; Acute upper respiratory infection; and Pain in right ankle and joints of right foot on her problem list.    Review of Systems  Constitutional: Positive for fatigue. Negative for chills, diaphoresis and fever.  HENT:  Negative.   Respiratory: Negative.  Negative for cough.   Cardiovascular: Negative.   Gastrointestinal: Positive for abdominal pain, diarrhea, nausea and vomiting. Negative for abdominal distention, anal bleeding, blood in stool, constipation and rectal pain.  Genitourinary: Negative.   Musculoskeletal: Negative for arthralgias, back pain, gait problem, joint swelling, myalgias, neck pain and neck stiffness.  Skin: Negative.   Neurological: Positive for dizziness. Negative for headaches.       Objective:   Physical Exam  Constitutional: She is oriented to person, place, and time. She appears well-developed and well-nourished.  HENT:  Head: Normocephalic and atraumatic.  Right Ear: External ear normal.  Left Ear: External ear normal.  Mouth/Throat: Oropharynx is clear and moist.  Eyes: Pupils are equal, round, and reactive to light. Conjunctivae and EOM are normal.  Neck: Normal range of motion. Neck supple. No thyromegaly present.  Cardiovascular: Normal rate, regular rhythm and normal heart sounds.  Exam reveals no gallop and no friction rub.   No murmur heard. Pulmonary/Chest: Effort normal and breath sounds normal. No respiratory distress. She has no wheezes.  Abdominal: Soft. Bowel sounds are normal. She exhibits distension. She exhibits no shifting dullness, no abdominal bruit, no pulsatile midline mass and no mass. There is no hepatosplenomegaly. There is tenderness in the epigastric area. There is no rigidity, no rebound, no guarding, no CVA tenderness, no tenderness at McBurney's point and negative Murphy's sign. No  hernia.  Musculoskeletal: Normal range of motion.  Lymphadenopathy:    She has no cervical adenopathy.  Neurological: She is alert and oriented to person, place, and time.  Skin: Skin is warm and dry.  Psychiatric: She has a normal mood and affect.      Assessment & Plan:    Nausea and vomiting, intractability of vomiting not specified, unspecified vomiting  type and diarrhea Will be off from work 08/06-08/10 Benign AB Please go to the ER if you have any severe AB pain, unable to hold down food/water, blood in stool or vomit, chest pain, shortness of breath, or any worsening symptoms.  -     pantoprazole (PROTONIX) 40 MG tablet; Take 1 tablet (40 mg total) by mouth daily. -     ondansetron (ZOFRAN) 8 MG tablet; 1/2-1 tablet every 6 hours as needed for nausea/vomiting -     dicyclomine (BENTYL) 10 MG capsule; Take 1 capsule (10 mg total) by mouth 3 (three) times daily as needed for spasms (diarrhea). -     CBC with Differential/Platelet -     BASIC METABOLIC PANEL WITH GFR -     Hepatic function panel  The patient was advised to call immediately if she has any concerning symptoms in the interval. The patient voices understanding of current treatment options and is in agreement with the current care plan.The patient knows to call the clinic with any problems, questions or concerns or go to the ER if any further progression of symptoms.

## 2017-04-09 NOTE — Patient Instructions (Signed)
Food Choices to Help Relieve Diarrhea, Adult °When you have diarrhea, the foods you eat and your eating habits are very important. Choosing the right foods and drinks can help: °· Relieve diarrhea. °· Replace lost fluids and nutrients. °· Prevent dehydration. ° °What general guidelines should I follow? °Relieving diarrhea °· Choose foods with less than 2 g or .07 oz. of fiber per serving. °· Limit fats to less than 8 tsp (38 g or 1.34 oz.) a day. °· Avoid the following: °? Foods and beverages sweetened with high-fructose corn syrup, honey, or sugar alcohols such as xylitol, sorbitol, and mannitol. °? Foods that contain a lot of fat or sugar. °? Fried, greasy, or spicy foods. °? High-fiber grains, breads, and cereals. °? Raw fruits and vegetables. °· Eat foods that are rich in probiotics. These foods include dairy products such as yogurt and fermented milk products. They help increase healthy bacteria in the stomach and intestines (gastrointestinal tract, or GI tract). °· If you have lactose intolerance, avoid dairy products. These may make your diarrhea worse. °· Take medicine to help stop diarrhea (antidiarrheal medicine) only as told by your health care provider. °Replacing nutrients °· Eat small meals or snacks every 3-4 hours. °· Eat bland foods, such as white rice, toast, or baked potato, until your diarrhea starts to get better. Gradually reintroduce nutrient-rich foods as tolerated or as told by your health care provider. This includes: °? Well-cooked protein foods. °? Peeled, seeded, and soft-cooked fruits and vegetables. °? Low-fat dairy products. °· Take vitamin and mineral supplements as told by your health care provider. °Preventing dehydration ° °· Start by sipping water or a special solution to prevent dehydration (oral rehydration solution, ORS). Urine that is clear or pale yellow means that you are getting enough fluid. °· Try to drink at least 8-10 cups of fluid each day to help replace lost  fluids. °· You may add other liquids in addition to water, such as clear juice or decaffeinated sports drinks, as tolerated or as told by your health care provider. °· Avoid drinks with caffeine, such as coffee, tea, or soft drinks. °· Avoid alcohol. °What foods are recommended? °The items listed may not be a complete list. Talk with your health care provider about what dietary choices are best for you. °Grains °White rice. White, French, or pita breads (fresh or toasted), including plain rolls, buns, or bagels. White pasta. Saltine, soda, or graham crackers. Pretzels. Low-fiber cereal. Cooked cereals made with water (such as cornmeal, farina, or cream cereals). Plain muffins. Matzo. Melba toast. Zwieback. °Vegetables °Potatoes (without the skin). Most well-cooked and canned vegetables without skins or seeds. Tender lettuce. °Fruits °Apple sauce. Fruits canned in juice. Cooked apricots, cherries, grapefruit, peaches, pears, or plums. Fresh bananas and cantaloupe. °Meats and other protein foods °Baked or boiled chicken. Eggs. Tofu. Fish. Seafood. Smooth nut butters. Ground or well-cooked tender beef, ham, veal, lamb, pork, or poultry. °Dairy °Plain yogurt, kefir, and unsweetened liquid yogurt. Lactose-free milk, buttermilk, skim milk, or soy milk. Low-fat or nonfat hard cheese. °Beverages °Water. Low-calorie sports drinks. Fruit juices without pulp. Strained tomato and vegetable juices. Decaffeinated teas. Sugar-free beverages not sweetened with sugar alcohols. Oral rehydration solutions, if approved by your health care provider. °Seasoning and other foods °Bouillon, broth, or soups made from recommended foods. °What foods are not recommended? °The items listed may not be a complete list. Talk with your health care provider about what dietary choices are best for you. °Grains °Whole grain, whole wheat,   bran, or rye breads, rolls, pastas, and crackers. Wild or brown rice. Whole grain or bran cereals. Barley. Oats and  oatmeal. Corn tortillas or taco shells. Granola. Popcorn. °Vegetables °Raw vegetables. Fried vegetables. Cabbage, broccoli, Brussels sprouts, artichokes, baked beans, beet greens, corn, kale, legumes, peas, sweet potatoes, and yams. Potato skins. Cooked spinach and cabbage. °Fruits °Dried fruit, including raisins and dates. Raw fruits. Stewed or dried prunes. Canned fruits with syrup. °Meat and other protein foods °Fried or fatty meats. Deli meats. Chunky nut butters. Nuts and seeds. Beans and lentils. Bacon. Hot dogs. Sausage. °Dairy °High-fat cheeses. Whole milk, chocolate milk, and beverages made with milk, such as milk shakes. Half-and-half. Cream. sour cream. Ice cream. °Beverages °Caffeinated beverages (such as coffee, tea, soda, or energy drinks). Alcoholic beverages. Fruit juices with pulp. Prune juice. Soft drinks sweetened with high-fructose corn syrup or sugar alcohols. High-calorie sports drinks. °Fats and oils °Butter. Cream sauces. Margarine. Salad oils. Plain salad dressings. Olives. Avocados. Mayonnaise. °Sweets and desserts °Sweet rolls, doughnuts, and sweet breads. Sugar-free desserts sweetened with sugar alcohols such as xylitol and sorbitol. °Seasoning and other foods °Honey. Hot sauce. Chili powder. Gravy. Cream-based or milk-based soups. Pancakes and waffles. °Summary °· When you have diarrhea, the foods you eat and your eating habits are very important. °· Make sure you get at least 8-10 cups of fluid each day, or enough to keep your urine clear or pale yellow. °· Eat bland foods and gradually reintroduce healthy, nutrient-rich foods as tolerated, or as told by your health care provider. °· Avoid high-fiber, fried, greasy, or spicy foods. °This information is not intended to replace advice given to you by your health care provider. Make sure you discuss any questions you have with your health care provider. °Document Released: 11/09/2003 Document Revised: 08/16/2016 Document Reviewed:  08/16/2016 °Elsevier Interactive Patient Education © 2017 [employer] ° ° ° ° °Viral Gastroenteritis, Adult °Viral gastroenteritis is also known as the stomach flu. This condition is caused by various viruses. These viruses can be passed from person to person very easily (are very contagious). This condition may affect your stomach, small intestine, and large intestine. It can cause sudden watery diarrhea, fever, and vomiting. °Diarrhea and vomiting can make you feel weak and cause you to become dehydrated. You may not be able to keep fluids down. Dehydration can make you tired and thirsty, cause you to have a dry mouth, and decrease how often you urinate. Older adults and people with other diseases or a weak immune system are at higher risk for dehydration. °It is important to replace the fluids that you lose from diarrhea and vomiting. If you become severely dehydrated, you may need to get fluids through an IV tube. °What are the causes? °Gastroenteritis is caused by various viruses, including rotavirus and norovirus. Norovirus is the most common cause in adults. °You can get sick by eating food, drinking water, or touching a surface contaminated with one of these viruses. You can also get sick from sharing utensils or other personal items with an infected person. °What increases the risk? °This condition is more likely to develop in people: °· Who have a weak defense system (immune system). °· Who live with one or more children who are younger than 2 years old. °· Who live in a nursing home. °· Who go on cruise ships. ° °What are the signs or symptoms? °Symptoms of this condition start suddenly 1-2 days after exposure to a virus. Symptoms may last a few days or   as long as a week. The most common symptoms are watery diarrhea and vomiting. Other symptoms include: °· Fever. °· Headache. °· Fatigue. °· Pain in the abdomen. °· Chills. °· Weakness. °· Nausea. °· Muscle aches. °· Loss of appetite. ° °How is this  diagnosed? °This condition is diagnosed with a medical history and physical exam. You may also have a stool test to check for viruses or other infections. °How is this treated? °This condition typically goes away on its own. The focus of treatment is to restore lost fluids (rehydration). Your health care provider may recommend that you take an oral rehydration solution (ORS) to replace important salts and minerals (electrolytes) in your body. Severe cases of this condition may require giving fluids through an IV tube. °Treatment may also include medicine to help with your symptoms. °Follow these instructions at home: °Follow instructions from your health care provider about how to care for yourself at home. °Eating and drinking °Follow these recommendations as told by your health care provider: °· Take an ORS. This is a drink that is sold at pharmacies and retail stores. °· Drink clear fluids in small amounts as you are able. Clear fluids include water, ice chips, diluted fruit juice, and low-calorie sports drinks. °· Eat bland, easy-to-digest foods in small amounts as you are able. These foods include bananas, applesauce, rice, lean meats, toast, and crackers. °· Avoid fluids that contain a lot of sugar or caffeine, such as energy drinks, sports drinks, and soda. °· Avoid alcohol. °· Avoid spicy or fatty foods. ° °General instructions ° °· Drink enough fluid to keep your urine clear or pale yellow. °· Wash your hands often. If soap and water are not available, use hand sanitizer. °· Make sure that all people in your household wash their hands well and often. °· Take over-the-counter and prescription medicines only as told by your health care provider. °· Rest at home while you recover. °· Watch your condition for any changes. °· Take a warm bath to relieve any burning or pain from frequent diarrhea episodes. °· Keep all follow-up visits as told by your health care provider. This is important. °Contact a health care  provider if: °· You cannot keep fluids down. °· Your symptoms get worse. °· You have new symptoms. °· You feel light-headed or dizzy. °· You have muscle cramps. °Get help right away if: °· You have chest pain. °· You feel extremely weak or you faint. °· You see blood in your vomit. °· Your vomit looks like coffee grounds. °· You have bloody or black stools or stools that look like tar. °· You have a severe headache, a stiff neck, or both. °· You have a rash. °· You have severe pain, cramping, or bloating in your abdomen. °· You have trouble breathing or you are breathing very quickly. °· Your heart is beating very quickly. °· Your skin feels cold and clammy. °· You feel confused. °· You have pain when you urinate. °· You have signs of dehydration, such as: °? Dark urine, very little urine, or no urine. °? Cracked lips. °? Dry mouth. °? Sunken eyes. °? Sleepiness. °? Weakness. °This information is not intended to replace advice given to you by your health care provider. Make sure you discuss any questions you have with your health care provider. °Document Released: 08/19/2005 Document Revised: 01/31/2016 Document Reviewed: 04/25/2015 °Elsevier Interactive Patient Education © 2017 Elsevier Inc. ° °

## 2017-04-10 LAB — BASIC METABOLIC PANEL WITH GFR
BUN: 10 mg/dL (ref 7–25)
CALCIUM: 8.9 mg/dL (ref 8.6–10.2)
CO2: 20 mmol/L (ref 20–32)
Chloride: 106 mmol/L (ref 98–110)
Creat: 0.68 mg/dL (ref 0.50–1.10)
GFR, Est Non African American: >89 mL/min (ref >=60)
GLUCOSE: 75 mg/dL (ref 65–99)
Potassium: 4.2 mmol/L (ref 3.5–5.3)
Sodium: 140 mmol/L (ref 135–146)

## 2017-04-10 LAB — HEPATIC FUNCTION PANEL
ALBUMIN: 3.9 g/dL (ref 3.6–5.1)
ALK PHOS: 83 U/L (ref 33–115)
ALT: 16 U/L (ref 6–29)
AST: 16 U/L (ref 10–35)
Bilirubin, Direct: 0.1 mg/dL (ref <=0.2)
Indirect Bilirubin: 0.4 mg/dL (ref 0.2–1.2)
TOTAL PROTEIN: 6.3 g/dL (ref 6.1–8.1)
Total Bilirubin: 0.5 mg/dL (ref 0.2–1.2)

## 2017-04-16 ENCOUNTER — Other Ambulatory Visit: Payer: Self-pay | Admitting: Physician Assistant

## 2017-04-16 DIAGNOSIS — Z0001 Encounter for general adult medical examination with abnormal findings: Secondary | ICD-10-CM

## 2017-04-16 DIAGNOSIS — F988 Other specified behavioral and emotional disorders with onset usually occurring in childhood and adolescence: Secondary | ICD-10-CM

## 2017-04-16 DIAGNOSIS — F3341 Major depressive disorder, recurrent, in partial remission: Secondary | ICD-10-CM

## 2017-04-16 MED ORDER — ALPRAZOLAM 1 MG PO TABS: 0.5000 mg | ORAL_TABLET | Freq: Three times a day (TID) | ORAL | 0 refills | Status: DC | PRN

## 2017-04-16 MED ORDER — LISDEXAMFETAMINE DIMESYLATE 60 MG PO CAPS: 60.0000 mg | ORAL_CAPSULE | ORAL | 0 refills | Status: DC

## 2017-04-17 MED FILL — ALPRAZolam 1 MG TABS: 1 | 25 days supply | Qty: 75 | Fill #0

## 2017-04-17 MED FILL — VYVANSE 60 MG CAPSULE: 60 | 30 days supply | Qty: 30 | Fill #0

## 2017-04-28 ENCOUNTER — Encounter: Payer: Self-pay | Admitting: Physician Assistant

## 2017-05-01 ENCOUNTER — Ambulatory Visit: Payer: Self-pay | Admitting: Physician Assistant

## 2017-05-01 ENCOUNTER — Ambulatory Visit (INDEPENDENT_AMBULATORY_CARE_PROVIDER_SITE_OTHER): Payer: 59 | Admitting: Physician Assistant

## 2017-05-01 ENCOUNTER — Other Ambulatory Visit: Payer: Self-pay | Admitting: Physician Assistant

## 2017-05-01 ENCOUNTER — Encounter: Payer: Self-pay | Admitting: Physician Assistant

## 2017-05-01 VITALS — BP 122/80 | HR 90 | Temp 97.3°F | Resp 14 | Ht 64.5 in | Wt 227.0 lb

## 2017-05-01 DIAGNOSIS — F3281 Premenstrual dysphoric disorder: Secondary | ICD-10-CM

## 2017-05-01 DIAGNOSIS — R112 Nausea with vomiting, unspecified: Secondary | ICD-10-CM | POA: Diagnosis not present

## 2017-05-01 DIAGNOSIS — G43801 Other migraine, not intractable, with status migrainosus: Secondary | ICD-10-CM

## 2017-05-01 MED ORDER — METOCLOPRAMIDE HCL 5 MG PO TABS: 5.0000 mg | ORAL_TABLET | Freq: Three times a day (TID) | ORAL | 0 refills | Status: DC | PRN

## 2017-05-01 MED ORDER — NORETHIN-ETH ESTRAD-FE BIPHAS 1 MG-10 MCG / 10 MCG PO TABS: 1.0000 | ORAL_TABLET | Freq: Every day | ORAL | 3 refills | Status: DC

## 2017-05-01 MED ORDER — NORETHIN ACE-ETH ESTRAD-FE 1-20 MG-MCG PO TABS: 1.0000 | ORAL_TABLET | Freq: Every day | ORAL | 11 refills | Status: DC

## 2017-05-01 NOTE — Progress Notes (Signed)
Subjective:    Patient ID: Marie Ingram, female    DOB: 1971/04/13, 46 y.o.   MRN: [id number]  HPI [age] y.o. WF with history of migraines presents with headache.  She states she has had achy headache, nausea, has happened after menses. Has taken zofran, Imitrex has not helped, very much associated with menses. Denies vision changes, weakness, changes in speech. Will also have emotional lability during the time, has history of heavy menses, endometrioses, will have abnormal menses occ.   Blood pressure 122/80, pulse 90, temperature (!) 97.3 F (36.3 C), resp. rate 14, height 5' 4.5" (1.638 m), weight 227 lb (103 kg), last menstrual period 04/23/2017, SpO2 99 %.  Medications Current Outpatient Prescriptions on File Prior to Visit  Medication Sig  . ALPRAZolam (XANAX) 1 MG tablet Take 0.5-1 tablets (0.5-1 mg total) by mouth 3 (three) times daily as needed (should last longer than a month, do not take daily).  Marland Kitchen. lisdexamfetamine (VYVANSE) 60 MG capsule Take 1 capsule (60 mg total) by mouth every morning.  . ondansetron (ZOFRAN) 8 MG tablet 1/2-1 tablet every 6 hours as needed for nausea/vomiting  . pantoprazole (PROTONIX) 40 MG tablet Take 1 tablet (40 mg total) by mouth daily.  . sertraline (ZOLOFT) 100 MG tablet Take 1 tablet (100 mg total) by mouth daily.  . SUMAtriptan (IMITREX) 100 MG tablet Take 1 tablet (100 mg total) by mouth once as needed for migraine. May repeat in 2 hours if headache persists or recurs.  . traZODone (DESYREL) 150 MG tablet Take 1 tablet (150 mg total) by mouth at bedtime.  . dicyclomine (BENTYL) 10 MG capsule Take 1 capsule (10 mg total) by mouth 3 (three) times daily as needed for spasms (diarrhea).   No current facility-administered medications on file prior to visit.     Problem list She has Anxiety; Migraines; Kidney stones; Insomnia; Depression; Morbid obesity (BMI 40.37); Gonalgia; Other abnormal glucose; Vitamin D deficiency; Abdominal pain; Acute upper  respiratory infection; and Pain in right ankle and joints of right foot on her problem list.  Review of Systems  Constitutional: Positive for fatigue. Negative for activity change, appetite change, chills, diaphoresis, fever and unexpected weight change.  Eyes: Negative.   Respiratory: Negative.   Cardiovascular: Negative.   Gastrointestinal: Negative.   Endocrine: Negative.   Genitourinary: Negative.   Musculoskeletal: Negative.   Neurological: Positive for headaches. Negative for dizziness, tremors, seizures, syncope, facial asymmetry, speech difficulty, weakness, light-headedness and numbness.  Hematological: Negative.   Psychiatric/Behavioral: Positive for dysphoric mood and sleep disturbance. Negative for agitation, behavioral problems, confusion, decreased concentration, hallucinations, self-injury and suicidal ideas. The patient is not nervous/anxious and is not hyperactive.        Objective:   Physical Exam  Constitutional: She appears well-developed and well-nourished.  HENT:  Head: Normocephalic and atraumatic.  Right Ear: External ear normal.  Nose: Right sinus exhibits no frontal sinus tenderness. Left sinus exhibits no frontal sinus tenderness.  Eyes: Conjunctivae and EOM are normal.  Neck: Normal range of motion. Neck supple.  + TMJ  Cardiovascular: Normal rate, regular rhythm, normal heart sounds and intact distal pulses.   Pulmonary/Chest: Effort normal and breath sounds normal. No respiratory distress. She has no wheezes.  Abdominal: Soft. Bowel sounds are normal.  obese  Lymphadenopathy:    She has no cervical adenopathy.  Skin: Skin is warm and dry.       Assessment & Plan:    Nausea and vomiting, intractability of vomiting not specified,  unspecified vomiting type PMDD (premenstrual dysphoric disorder) Other migraine with status migrainosus, not intractable -     Norethindrone-Ethinyl Estradiol-Fe Biphas (LO LOESTRIN FE) 1 MG-10 MCG / 10 MCG tablet; Take  1 tablet by mouth daily. -     metoCLOPramide (REGLAN) 5 MG tablet; Take 1 tablet (5 mg total) by mouth every 8 (eight) hours as needed for nausea (migraines). Continue maxalt PRN, if this doe snot help will do effexor No red flags Out of work from 27th through the 31st Migraine, nausea, vomiting

## 2017-05-01 NOTE — Patient Instructions (Signed)
Add ENTERIC COATED low dose 81 mg Aspirin daily OR can do every other day if you have easy bruising to protect your heart and head. As well as to reduce risk of Colon Cancer by 20 %, Skin Cancer by 26 % , Melanoma by 46% and Pancreatic cancer by 60%   Starting on low dose estrogen, may need to increase Add bASA Can take imitrex as needed If this does not help may consider effexor.  Can take reglan AS needed for migraine     Migraine Headache  A migraine headache is an intense, throbbing pain on one side or both sides of the head. Migraines may also cause other symptoms, such as nausea, vomiting, and sensitivity to light and noise. What are the causes? Doing or taking certain things may also trigger migraines, such as:  Alcohol.  Smoking.  Medicines, such as: ? Medicine used to treat chest pain (nitroglycerine). ? Birth control pills. ? Estrogen pills. ? Certain blood pressure medicines.  Aged cheeses, chocolate, or caffeine.  Foods or drinks that contain nitrates, glutamate, aspartame, or tyramine.  Physical activity. Other things that may trigger a migraine include:  Menstruation.  Pregnancy.  Hunger.  Stress, lack of sleep, too much sleep, or fatigue.  Weather changes. What increases the risk? The following factors may make you more likely to experience migraine headaches:  Age. Risk increases with age.  Family history of migraine headaches.  Being Caucasian.  Depression and anxiety.  Obesity.  Being a woman.  Having a hole in the heart (patent foramen ovale) or other heart problems. What are the signs or symptoms? The main symptom of this condition is pulsating or throbbing pain. Pain may:  Happen in any area of the head, such as on one side or both sides.  Interfere with daily activities.  Get worse with physical activity.  Get worse with exposure to bright lights or loud noises. Other symptoms may  include:  Nausea.  Vomiting.  Dizziness.  General sensitivity to bright lights, loud noises, or smells. Before you get a migraine, you may get warning signs that a migraine is developing (aura). An aura may include:  Seeing flashing lights or having blind spots.  Seeing bright spots, halos, or zigzag lines.  Having tunnel vision or blurred vision.  Having numbness or a tingling feeling.  Having trouble talking.  Having muscle weakness. How is this diagnosed? A migraine headache can be diagnosed based on:  Your symptoms.  A physical exam.  Tests, such as CT scan or MRI of the head. These imaging tests can help rule out other causes of headaches.  Taking fluid from the spine (lumbar puncture) and analyzing it (cerebrospinal fluid analysis, or CSF analysis). How is this treated? A migraine headache is usually treated with medicines that:  Relieve pain.  Relieve nausea.  Prevent migraines from coming back. Treatment may also include:  Acupuncture.  Lifestyle changes like avoiding foods that trigger migraines. Follow these instructions at home: Medicines  Take over-the-counter and prescription medicines only as told by your health care provider.  Do not drive or use heavy machinery while taking prescription pain medicine.  To prevent or treat constipation while you are taking prescription pain medicine, your health care provider may recommend that you: ? Drink enough fluid to keep your urine clear or pale yellow. ? Take over-the-counter or prescription medicines. ? Eat foods that are high in fiber, such as fresh fruits and vegetables, whole grains, and beans. ? Limit foods that are  high in fat and processed sugars, such as fried and sweet foods. Lifestyle  Avoid alcohol use.  Do not use any products that contain nicotine or tobacco, such as cigarettes and e-cigarettes. If you need help quitting, ask your health care provider.  Get at least 8 hours of sleep  every night.  Limit your stress.  General instructions  Keep a journal to find out what may trigger your migraine headaches. For example, write down: ? What you eat and drink. ? How much sleep you get. ? Any change to your diet or medicines.  If you have a migraine: ? Avoid things that make your symptoms worse, such as bright lights. ? It may help to lie down in a dark, quiet room. ? Do not drive or use heavy machinery. ? Ask your health care provider what activities are safe for you while you are experiencing symptoms.  Keep all follow-up visits as told by your health care provider. This is important. Contact a health care provider if:  You develop symptoms that are different or more severe than your usual migraine symptoms. Get help right away if:  Your migraine becomes severe.  You have a fever.  You have a stiff neck.  You have vision loss.  Your muscles feel weak or like you cannot control them.  You start to lose your balance often.  You develop trouble walking.  You faint. This information is not intended to replace advice given to you by your health care provider. Make sure you discuss any questions you have with your health care provider. Document Released: 08/19/2005 Document Revised: 03/08/2016 Document Reviewed: 02/05/2016 Elsevier Interactive Patient Education  2017 [employer]

## 2017-05-07 ENCOUNTER — Other Ambulatory Visit: Payer: Self-pay | Admitting: Physician Assistant

## 2017-05-07 MED ORDER — DROSPIRENONE-ETHINYL ESTRADIOL 3-0.03 MG PO TABS
1.0000 | ORAL_TABLET | Freq: Every day | ORAL | 3 refills | Status: DC
Start: 2017-05-07 — End: 2017-10-02

## 2017-05-12 ENCOUNTER — Other Ambulatory Visit: Payer: Self-pay | Admitting: Physician Assistant

## 2017-05-12 ENCOUNTER — Encounter: Payer: Self-pay | Admitting: Physician Assistant

## 2017-05-12 MED ORDER — VENLAFAXINE HCL ER 37.5 MG PO CP24: 37.5000 mg | ORAL_CAPSULE | Freq: Every day | ORAL | 2 refills | Status: DC

## 2017-05-12 MED FILL — VENLAFAXINE HCL ER 37.5 MG: 37.5 | 30 days supply | Qty: 30 | Fill #0

## 2017-05-15 ENCOUNTER — Other Ambulatory Visit: Payer: Self-pay | Admitting: Physician Assistant

## 2017-05-15 NOTE — Telephone Encounter (Signed)
Please call Alpraz  

## 2017-05-15 NOTE — Telephone Encounter (Signed)
patient requesting early alprazolam renewal, . not working this friday or weekend, please advise, vm ok, call  [facility]. She would like to pick up after work today.

## 2017-05-16 NOTE — Telephone Encounter (Signed)
Xanax called into pharmacy on 14th sept 2018 by dd

## 2017-05-28 ENCOUNTER — Other Ambulatory Visit: Payer: Self-pay | Admitting: Physician Assistant

## 2017-05-28 DIAGNOSIS — F3341 Major depressive disorder, recurrent, in partial remission: Secondary | ICD-10-CM

## 2017-05-28 DIAGNOSIS — F988 Other specified behavioral and emotional disorders with onset usually occurring in childhood and adolescence: Secondary | ICD-10-CM

## 2017-05-28 DIAGNOSIS — Z0001 Encounter for general adult medical examination with abnormal findings: Secondary | ICD-10-CM

## 2017-05-28 MED ORDER — LISDEXAMFETAMINE DIMESYLATE 60 MG PO CAPS: 60.0000 mg | ORAL_CAPSULE | ORAL | 0 refills | Status: DC

## 2017-05-28 MED FILL — ALPRAZolam 1 MG TABS: 1 | 25 days supply | Qty: 75 | Fill #0

## 2017-05-28 MED FILL — VYVANSE 60 MG CAPSULE: 60 | 30 days supply | Qty: 30 | Fill #0

## 2017-06-04 ENCOUNTER — Ambulatory Visit: Payer: Self-pay | Admitting: Physician Assistant

## 2017-06-08 NOTE — Progress Notes (Deleted)
Complete Physical  Assessment and Plan:  Morbid obesity (BMI 40.37) - long discussion about weight loss, diet, and exercise -     TSH  Other abnormal glucose Discussed general issues about diabetes pathophysiology and management., Educational material distributed., Suggested low cholesterol diet., Encouraged aerobic exercise., Discussed foot care., Reminded to get yearly retinal exam. -     TSH -     Hemoglobin A1c -     Urinalysis, Routine w reflex microscopic (not at [facility]) -     Microalbumin / creatinine urine ratio -     EKG 12-Lead  Other migraine without status migrainosus, not intractable  continue meds, avoid triggers  Kidney stones Increase fluids  Anxiety -     sertraline (ZOLOFT) 100 MG tablet; Take 1 tablet (100 mg total) by mouth daily.  Insomnia, unspecified type Continue medications  Recurrent major depressive disorder, in partial remission (HCC) -     lisdexamfetamine (VYVANSE) 60 MG capsule; Take 1 capsule (60 mg total) by mouth every morning. -     sertraline (ZOLOFT) 100 MG tablet; Take 1 tablet (100 mg total) by mouth daily.  Vitamin D deficiency -     VITAMIN D 25 Hydroxy (Vit-D Deficiency, Fractures)  Vaginal discharge - check labs -     GC/Chlamydia Probe Amp -     WET PREP BY MOLECULAR PROBE  Screen for STD (sexually transmitted disease) -     HIV antibody -     Hepatitis C antibody -     RPR -     HSV(herpes simplex vrs) 1+2 ab-IgG  Medication management -     CBC with Differential/Platelet -     BASIC METABOLIC PANEL WITH GFR -     Hepatic function panel -     Magnesium  Encounter for general adult medical examination with abnormal findings -     lisdexamfetamine (VYVANSE) 60 MG capsule; Take 1 capsule (60 mg total) by mouth every morning. -     sertraline (ZOLOFT) 100 MG tablet; Take 1 tablet (100 mg total) by mouth daily. -     CBC with Differential/Platelet -     BASIC METABOLIC PANEL WITH GFR -     Hepatic function panel -      TSH -     Lipid panel -     Hemoglobin A1c -     Magnesium -     VITAMIN D 25 Hydroxy (Vit-D Deficiency, Fractures) -     Urinalysis, Routine w reflex microscopic (not at [facility]) -     Microalbumin / creatinine urine ratio -     EKG 12-Lead -     HIV antibody -     Hepatitis C antibody -     RPR -     HSV(herpes simplex vrs) 1+2 ab-IgG  Attention deficit disorder, unspecified hyperactivity presence -     lisdexamfetamine (VYVANSE) 60 MG capsule; Take 1 capsule (60 mg total) by mouth every morning.  Hyperlipidemia, unspecified hyperlipidemia type -     Lipid panel -     EKG 12-Lead   Discussed med's effects and SE's. Screening labs and tests as requested with regular follow-up as recommended. Over 40 minutes of exam, counseling, chart review, and complex, high level critical decision making was performed this visit.   HPI  46 y.o. female  presents for a complete physical and follow up for has Anxiety; Migraines; Kidney stones; Insomnia; Depression; Morbid obesity (BMI 40.37); Other abnormal glucose; and Vitamin D deficiency on  her problem list..  Her blood pressure has been controlled at home, today their BP is   She does not workout. She denies chest pain, shortness of breath, dizziness.   She is not on cholesterol medication and denies myalgias. Her cholesterol is not at goal. The cholesterol last visit was:   Lab Results  Component Value Date   CHOL 209 (H) 01/01/2017   HDL 48 (L) 01/01/2017   LDLCALC 125 (H) 01/01/2017   TRIG 179 (H) 01/01/2017   CHOLHDL 4.4 01/01/2017   She has been working on diet and exercise for prediabetes, and denies hypoglycemia , paresthesia of the feet, polydipsia, polyuria and visual disturbances. Last A1C in the office was:  Lab Results  Component Value Date   HGBA1C 5.2 06/05/2016   Patient is on Vitamin D supplement.   Lab Results  Component Value Date   VD25OH 36 06/05/2016     BMI is There is no height or weight on file to calculate  BMI., she is working on diet and exercise. Wt Readings from Last 3 Encounters:  05/01/17 227 lb (103 kg)  04/09/17 225 lb 9.6 oz (102.3 kg)  01/01/17 224 lb (101.6 kg)   Patient is on an ADD medication, she states that the medication is helping and she denies any adverse reactions. She is doing very well on the vyvanse, very good focus without physical symptoms however she would have to take a second one around 1-2 pm.  She has anxiety/depression controlled with zoloft, xanax 2 at night and on trazodone at night , has been off zoloft so has been having some decreased motivation.   Current Medications:  Current Outpatient Prescriptions on File Prior to Visit  Medication Sig Dispense Refill  . ALPRAZolam (XANAX) 1 MG tablet TAKE 1/2 TO 1 TABLET BY MOUTH 3 TIMES A DAY AS NEEDED. (SHOULD LAST LONGER THAN A MONTH. DO NOT TAKE DAILY) 75 tablet 0  . dicyclomine (BENTYL) 10 MG capsule Take 1 capsule (10 mg total) by mouth 3 (three) times daily as needed for spasms (diarrhea). 30 capsule 0  . drospirenone-ethinyl estradiol (OCELLA) 3-0.03 MG tablet Take 1 tablet by mouth daily. 1 Package 3  . lisdexamfetamine (VYVANSE) 60 MG capsule Take 1 capsule (60 mg total) by mouth every morning. 30 capsule 0  . metoCLOPramide (REGLAN) 5 MG tablet Take 1 tablet (5 mg total) by mouth every 8 (eight) hours as needed for nausea (migraines). 20 tablet 0  . norethindrone-ethinyl estradiol (JUNEL FE,GILDESS FE,LOESTRIN FE) 1-20 MG-MCG tablet Take 1 tablet by mouth daily. 1 Package 11  . ondansetron (ZOFRAN) 8 MG tablet 1/2-1 tablet every 6 hours as needed for nausea/vomiting 28 tablet 0  . pantoprazole (PROTONIX) 40 MG tablet Take 1 tablet (40 mg total) by mouth daily. 14 tablet 0  . sertraline (ZOLOFT) 100 MG tablet Take 1 tablet (100 mg total) by mouth daily. 90 tablet 0  . SUMAtriptan (IMITREX) 100 MG tablet Take 1 tablet (100 mg total) by mouth once as needed for migraine. May repeat in 2 hours if headache persists  or recurs. 10 tablet 2  . traZODone (DESYREL) 150 MG tablet Take 1 tablet (150 mg total) by mouth at bedtime. 90 tablet 1  . venlafaxine XR (EFFEXOR XR) 37.5 MG 24 hr capsule Take 1 capsule (37.5 mg total) by mouth daily. 30 capsule 2   No current facility-administered medications on file prior to visit.    Allergies:  Allergies  Allergen Reactions  . Sulfa Antibiotics Itching  .  Amoxicillin Rash  . Penicillins Rash   Medical History:  She has Anxiety; Migraines; Kidney stones; Insomnia; Depression; Morbid obesity (BMI 40.37); Other abnormal glucose; and Vitamin D deficiency on her problem list.   Health Maintenance:   Immunization History  Administered Date(s) Administered  . Influenza,inj,Quad PF,6+ Mos 06/04/2016  . Pneumococcal-Unspecified 09/02/1996  . Td 10/05/2002   Tetanus:2004 DUE Pneumovax: 1998 Prevnar 13:  Flu vaccine: 2017 at work Zostavax:  No LMP recorded. Pap: 10/2015 Dr. Audie Box MGM: never had MGM, needs  DEXA: N/A Colonoscopy: N/A EGD: N/A CXR 2016  Patient Care Team: Quentin Mulling, Cordelia Poche as PCP - General (Physician Assistant)  Surgical History:  She has a past surgical history that includes Cesarean section and Pelvic laparoscopy (2004). Family History:  Herfamily history includes ADD / ADHD in her daughter; Autism in her son; Celiac disease in her sister; Depression in her father; Hypertension in her mother; Migraines in her father and mother. Social History:  She reports that she quit smoking about 14 years ago. She has never used smokeless tobacco. She reports that she drinks alcohol. She reports that she does not use drugs.  Review of Systems: Review of Systems  Constitutional: Negative.   HENT: Negative.   Eyes: Negative.   Respiratory: Negative.   Cardiovascular: Negative.   Gastrointestinal: Negative.   Genitourinary: Negative.   Musculoskeletal: Negative.   Skin: Negative.   Neurological: Negative.   Endo/Heme/Allergies:  Negative.   Psychiatric/Behavioral: Negative.     Physical Exam: Estimated body mass index is 38.36 kg/m as calculated from the following:   Height as of 05/01/17: 5' 4.5" (1.638 m).   Weight as of 05/01/17: 227 lb (103 kg). There were no vitals taken for this visit. General Appearance: Well nourished, in no apparent distress.  Eyes: PERRLA, EOMs, conjunctiva no swelling or erythema, normal fundi and vessels.  Sinuses: No Frontal/maxillary tenderness  ENT/Mouth: Ext aud canals clear, normal light reflex with TMs without erythema, bulging. Good dentition. No erythema, swelling, or exudate on post pharynx. Tonsils not swollen or erythematous. Hearing normal.  Neck: Supple, thyroid normal. No bruits  Respiratory: Respiratory effort normal, BS equal bilaterally without rales, rhonchi, wheezing or stridor.  Cardio: RRR without murmurs, rubs or gallops. Brisk peripheral pulses without edema.  Chest: symmetric, with normal excursions and percussion.  Breasts: Symmetric, without lumps, nipple discharge, retractions.  Abdomen: Soft, nontender, no guarding, rebound, hernias, masses, or organomegaly.  Lymphatics: Non tender without lymphadenopathy.  Genitourinary: VULVA: vulvar erythema and mild swelling, VAGINA: vaginal erythema , vaginal discharge - white and curd-like, CERVIX: normal appearing cervix without discharge or lesions. Musculoskeletal: Full ROM all peripheral extremities,5/5 strength, and normal gait.  Skin: Warm, dry without rashes, lesions, ecchymosis. Neuro: Cranial nerves intact, reflexes equal bilaterally. Normal muscle tone, no cerebellar symptoms. Sensation intact.  Psych: Awake and oriented X 3, normal affect, Insight and Judgment appropriate.   EKG: WNL no ST changes. AORTA SCAN: defer  Quentin Mulling 11:45 AM [facility]

## 2017-06-09 ENCOUNTER — Encounter: Payer: Self-pay | Admitting: Physician Assistant

## 2017-06-10 ENCOUNTER — Encounter: Payer: Self-pay | Admitting: Physician Assistant

## 2017-06-16 MED FILL — traZODone HCL 150 MG TABS: 150 | 90 days supply | Qty: 90 | Fill #1

## 2017-06-24 NOTE — Progress Notes (Deleted)
Complete Physical  Assessment and Plan:  Morbid obesity (BMI 40.37) - long discussion about weight loss, diet, and exercise -     TSH  Other abnormal glucose Discussed general issues about diabetes pathophysiology and management., Educational material distributed., Suggested low cholesterol diet., Encouraged aerobic exercise., Discussed foot care., Reminded to get yearly retinal exam. -     TSH -     Hemoglobin A1c -     Urinalysis, Routine w reflex microscopic (not at [facility]) -     Microalbumin / creatinine urine ratio -     EKG 12-Lead  Other migraine without status migrainosus, not intractable  continue meds, avoid triggers  Kidney stones Increase fluids  Anxiety -     sertraline (ZOLOFT) 100 MG tablet; Take 1 tablet (100 mg total) by mouth daily.  Insomnia, unspecified type Continue medications  Recurrent major depressive disorder, in partial remission (HCC) -     lisdexamfetamine (VYVANSE) 60 MG capsule; Take 1 capsule (60 mg total) by mouth every morning. -     sertraline (ZOLOFT) 100 MG tablet; Take 1 tablet (100 mg total) by mouth daily.  Vitamin D deficiency -     VITAMIN D 25 Hydroxy (Vit-D Deficiency, Fractures)  Vaginal discharge - check labs -     GC/Chlamydia Probe Amp -     WET PREP BY MOLECULAR PROBE  Screen for STD (sexually transmitted disease) -     HIV antibody -     Hepatitis C antibody -     RPR -     HSV(herpes simplex vrs) 1+2 ab-IgG  Medication management -     CBC with Differential/Platelet -     BASIC METABOLIC PANEL WITH GFR -     Hepatic function panel -     Magnesium  Encounter for general adult medical examination with abnormal findings -     lisdexamfetamine (VYVANSE) 60 MG capsule; Take 1 capsule (60 mg total) by mouth every morning. -     sertraline (ZOLOFT) 100 MG tablet; Take 1 tablet (100 mg total) by mouth daily. -     CBC with Differential/Platelet -     BASIC METABOLIC PANEL WITH GFR -     Hepatic function panel -      TSH -     Lipid panel -     Hemoglobin A1c -     Magnesium -     VITAMIN D 25 Hydroxy (Vit-D Deficiency, Fractures) -     Urinalysis, Routine w reflex microscopic (not at [facility]) -     Microalbumin / creatinine urine ratio -     EKG 12-Lead -     HIV antibody -     Hepatitis C antibody -     RPR -     HSV(herpes simplex vrs) 1+2 ab-IgG  Attention deficit disorder, unspecified hyperactivity presence -     lisdexamfetamine (VYVANSE) 60 MG capsule; Take 1 capsule (60 mg total) by mouth every morning.  Hyperlipidemia, unspecified hyperlipidemia type -     Lipid panel -     EKG 12-Lead   Discussed med's effects and SE's. Screening labs and tests as requested with regular follow-up as recommended. Over 40 minutes of exam, counseling, chart review, and complex, high level critical decision making was performed this visit.   HPI  46 y.o. female  presents for a complete physical and follow up for has Anxiety; Migraines; Kidney stones; Insomnia; Depression; Morbid obesity (BMI 40.37); Other abnormal glucose; and Vitamin D deficiency on  her problem list..  Her blood pressure has been controlled at home, today their BP is   She does not workout. She denies chest pain, shortness of breath, dizziness.   She has chronic migraines. Started on effexor last time, low dose.   She is not on cholesterol medication and denies myalgias. Her cholesterol is not at goal. The cholesterol last visit was:   Lab Results  Component Value Date   CHOL 209 (H) 01/01/2017   HDL 48 (L) 01/01/2017   LDLCALC 125 (H) 01/01/2017   TRIG 179 (H) 01/01/2017   CHOLHDL 4.4 01/01/2017   She has been working on diet and exercise for prediabetes, and denies hypoglycemia , paresthesia of the feet, polydipsia, polyuria and visual disturbances. Last A1C in the office was:  Lab Results  Component Value Date   HGBA1C 5.2 06/05/2016   Patient is on Vitamin D supplement.   Lab Results  Component Value Date   VD25OH 36  06/05/2016     BMI is There is no height or weight on file to calculate BMI., she is working on diet and exercise. Wt Readings from Last 3 Encounters:  05/01/17 227 lb (103 kg)  04/09/17 225 lb 9.6 oz (102.3 kg)  01/01/17 224 lb (101.6 kg)   Patient is on an ADD medication, she states that the medication is helping and she denies any adverse reactions. She is doing very well on the vyvanse, very good focus without physical symptoms. She has anxiety/depression controlled with zoloft, xanax 2 at night and on trazodone at night, has increased stress with husband moving into old house and needs to sells it.    Current Medications:  Current Outpatient Prescriptions on File Prior to Visit  Medication Sig Dispense Refill  . ALPRAZolam (XANAX) 1 MG tablet TAKE 1/2 TO 1 TABLET BY MOUTH 3 TIMES A DAY AS NEEDED. (SHOULD LAST LONGER THAN A MONTH. DO NOT TAKE DAILY) 75 tablet 0  . dicyclomine (BENTYL) 10 MG capsule Take 1 capsule (10 mg total) by mouth 3 (three) times daily as needed for spasms (diarrhea). 30 capsule 0  . drospirenone-ethinyl estradiol (OCELLA) 3-0.03 MG tablet Take 1 tablet by mouth daily. 1 Package 3  . lisdexamfetamine (VYVANSE) 60 MG capsule Take 1 capsule (60 mg total) by mouth every morning. 30 capsule 0  . metoCLOPramide (REGLAN) 5 MG tablet Take 1 tablet (5 mg total) by mouth every 8 (eight) hours as needed for nausea (migraines). 20 tablet 0  . norethindrone-ethinyl estradiol (JUNEL FE,GILDESS FE,LOESTRIN FE) 1-20 MG-MCG tablet Take 1 tablet by mouth daily. 1 Package 11  . ondansetron (ZOFRAN) 8 MG tablet 1/2-1 tablet every 6 hours as needed for nausea/vomiting 28 tablet 0  . pantoprazole (PROTONIX) 40 MG tablet Take 1 tablet (40 mg total) by mouth daily. 14 tablet 0  . sertraline (ZOLOFT) 100 MG tablet Take 1 tablet (100 mg total) by mouth daily. 90 tablet 0  . SUMAtriptan (IMITREX) 100 MG tablet Take 1 tablet (100 mg total) by mouth once as needed for migraine. May repeat in 2  hours if headache persists or recurs. 10 tablet 2  . traZODone (DESYREL) 150 MG tablet Take 1 tablet (150 mg total) by mouth at bedtime. 90 tablet 1  . venlafaxine XR (EFFEXOR XR) 37.5 MG 24 hr capsule Take 1 capsule (37.5 mg total) by mouth daily. 30 capsule 2   No current facility-administered medications on file prior to visit.    Allergies:  Allergies  Allergen Reactions  .  Sulfa Antibiotics Itching  . Amoxicillin Rash  . Penicillins Rash   Medical History:  She has Anxiety; Migraines; Kidney stones; Insomnia; Depression; Morbid obesity (BMI 40.37); Other abnormal glucose; and Vitamin D deficiency on her problem list.   Health Maintenance:   Immunization History  Administered Date(s) Administered  . Influenza,inj,Quad PF,6+ Mos 06/04/2016  . Pneumococcal-Unspecified 09/02/1996  . Td 10/05/2002   Tetanus:2004 DUE Pneumovax: 1998 Prevnar 13:  Flu vaccine: 2017 at work Zostavax:  No LMP recorded. Pap: 10/2015 Dr. Audie BoxFontaine MGM: 07/2016 DEXA: N/A Colonoscopy: DUE age 46 EGD: N/A CXR 2016  Patient Care Team: Quentin Mullingollier, Braelee Herrle, Cordelia PochePA-C as PCP - General (Physician Assistant)  Surgical History:  She has a past surgical history that includes Cesarean section and Pelvic laparoscopy (2004). Family History:  Herfamily history includes ADD / ADHD in her daughter; Autism in her son; Celiac disease in her sister; Depression in her father; Hypertension in her mother; Migraines in her father and mother. Social History:  She reports that she quit smoking about 14 years ago. She has never used smokeless tobacco. She reports that she drinks alcohol. She reports that she does not use drugs.  Review of Systems: Review of Systems  Constitutional: Negative.   HENT: Negative.   Eyes: Negative.   Respiratory: Negative.   Cardiovascular: Negative.   Gastrointestinal: Negative.   Genitourinary: Negative.   Musculoskeletal: Negative.   Skin: Negative.   Neurological: Negative.    Endo/Heme/Allergies: Negative.   Psychiatric/Behavioral: Negative.     Physical Exam: Estimated body mass index is 38.36 kg/m as calculated from the following:   Height as of 05/01/17: 5' 4.5" (1.638 m).   Weight as of 05/01/17: 227 lb (103 kg). There were no vitals taken for this visit. General Appearance: Well nourished, in no apparent distress.  Eyes: PERRLA, EOMs, conjunctiva no swelling or erythema, normal fundi and vessels.  Sinuses: No Frontal/maxillary tenderness  ENT/Mouth: Ext aud canals clear, normal light reflex with TMs without erythema, bulging. Good dentition. No erythema, swelling, or exudate on post pharynx. Tonsils not swollen or erythematous. Hearing normal.  Neck: Supple, thyroid normal. No bruits  Respiratory: Respiratory effort normal, BS equal bilaterally without rales, rhonchi, wheezing or stridor.  Cardio: RRR without murmurs, rubs or gallops. Brisk peripheral pulses without edema.  Chest: symmetric, with normal excursions and percussion.  Breasts: Symmetric, without lumps, nipple discharge, retractions.  Abdomen: Soft, nontender, no guarding, rebound, hernias, masses, or organomegaly.  Lymphatics: Non tender without lymphadenopathy.  Genitourinary: VULVA: vulvar erythema and mild swelling, VAGINA: vaginal erythema , vaginal discharge - white and curd-like, CERVIX: normal appearing cervix without discharge or lesions. Musculoskeletal: Full ROM all peripheral extremities,5/5 strength, and normal gait.  Skin: Warm, dry without rashes, lesions, ecchymosis. Neuro: Cranial nerves intact, reflexes equal bilaterally. Normal muscle tone, no cerebellar symptoms. Sensation intact.  Psych: Awake and oriented X 3, normal affect, Insight and Judgment appropriate.   EKG: WNL no ST changes. AORTA SCAN: defer  Quentin MullingAmanda Vaughn Beaumier 2:09 PM [facility]

## 2017-06-25 ENCOUNTER — Ambulatory Visit (INDEPENDENT_AMBULATORY_CARE_PROVIDER_SITE_OTHER): Payer: 59 | Admitting: Adult Health

## 2017-06-25 ENCOUNTER — Ambulatory Visit: Payer: Self-pay | Admitting: Physician Assistant

## 2017-06-25 ENCOUNTER — Encounter: Payer: Self-pay | Admitting: Adult Health

## 2017-06-25 VITALS — BP 108/72 | HR 83 | Temp 97.5°F | Ht 64.5 in | Wt 238.4 lb

## 2017-06-25 DIAGNOSIS — G43819 Other migraine, intractable, without status migrainosus: Secondary | ICD-10-CM | POA: Diagnosis not present

## 2017-06-25 MED ORDER — KETOROLAC TROMETHAMINE 60 MG/2ML IM SOLN
60.0000 mg | Freq: Once | INTRAMUSCULAR | Status: AC
  Administered 2017-06-25: 60 mg via INTRAMUSCULAR

## 2017-06-25 MED ORDER — KETOROLAC TROMETHAMINE 30 MG/ML IJ SOLN: 30.0000 mg | Freq: Once | INTRAMUSCULAR | Status: DC

## 2017-06-25 MED ORDER — KETOROLAC TROMETHAMINE 30 MG/ML IJ SOLN: 60.0000 mg | Freq: Once | INTRAMUSCULAR | Status: DC

## 2017-06-25 MED ORDER — VERAPAMIL HCL ER 120 MG PO TBCR: 120.0000 mg | EXTENDED_RELEASE_TABLET | Freq: Every day | ORAL | 11 refills | Status: DC

## 2017-06-25 MED ORDER — ZONISAMIDE 50 MG PO CAPS: 50.0000 mg | ORAL_CAPSULE | Freq: Every day | ORAL | 3 refills | Status: DC

## 2017-06-25 MED ORDER — ZONISAMIDE 50 MG PO CAPS
50.0000 mg | ORAL_CAPSULE | Freq: Every day | ORAL | 0 refills | Status: DC
Start: 2017-06-25 — End: 2017-06-25

## 2017-06-25 MED FILL — VERAPAMIL ER 120 MG TABLET: 120 | 30 days supply | Qty: 30 | Fill #0

## 2017-06-25 NOTE — Progress Notes (Signed)
Assessment and Plan:  Marie DavenportShelby was seen today for migraine.  Diagnoses and all orders for this visit:  Other migraine without status migrainosus, intractable -     ketorolac (TORADOL) 30 MG/ML injection 30 mg; Inject 2 mL (60 mg total) into the muscle once. -     zonisamide (ZONEGRAN) 50 MG capsule; Take 1 capsule (50 mg total) by mouth daily. -     verapamil (CALAN-SR) 120 MG CR tablet; Take 1 tablet (120 mg total) by mouth daily - may take daily or PRN -     Start magnesium supplement 250 mg TID -     Will provider neurology referral per patient request; make appointment for a few weeks out, and may cancel if daily medications seem to reduce frequency  Further disposition pending results of labs. Discussed med's effects and SE's.   Over 20 minutes of exam, counseling, chart review, and critical decision making was performed.   Future Appointments Date Time Provider Department Center  09/25/2017 11:00 AM Marie Mullingollier, Amanda, PA-C [facility] None    ------------------------------------------------------------------------------------------------------------------   HPI BP 108/72   Pulse 83   Temp (!) 97.5 F (36.4 C)   Ht 5' 4.5" (1.638 m)   Wt 238 lb 6.4 oz (108.1 kg)   LMP 05/28/2017 (Approximate)   SpO2 98%   BMI 40.29 kg/m   46 y.o.female presents for a migraine ongoing since Monday 6 am, she reports it has started to get better several times but seems to keep becoming worse. The pain is a continuous throbbing pressure left frontal 7/10, having photosensitivity, N/V, but denies dizziness, weakness, numbness/tingling, syncope. She has not taken imitrex but has taken reglan which was started at the last visit; she was unsure if this medication was intended to replace imitrex. She has not taken any other OTD medications.   She reports a distant history of migraines, but reports she had not had any in several years, but has had 6-7 migraines in the past 2 months after not having  migraines for several years. She is requesting a neurology referral today. We also discussed initiating a prophylactic agent, several options discussed and will be initiated today.   Past Medical History:  Diagnosis Date  . Anxiety   . Depression   . Endometriosis   . Insomnia   . Kidney stones   . Migraines      Allergies  Allergen Reactions  . Sulfa Antibiotics Itching  . Amoxicillin Rash  . Penicillins Rash    Current Outpatient Prescriptions on File Prior to Visit  Medication Sig  . ALPRAZolam (XANAX) 1 MG tablet TAKE 1/2 TO 1 TABLET BY MOUTH 3 TIMES A DAY AS NEEDED. (SHOULD LAST LONGER THAN A MONTH. DO NOT TAKE DAILY)  . lisdexamfetamine (VYVANSE) 60 MG capsule Take 1 capsule (60 mg total) by mouth every morning.  . metoCLOPramide (REGLAN) 5 MG tablet Take 1 tablet (5 mg total) by mouth every 8 (eight) hours as needed for nausea (migraines).  . ondansetron (ZOFRAN) 8 MG tablet 1/2-1 tablet every 6 hours as needed for nausea/vomiting  . sertraline (ZOLOFT) 100 MG tablet Take 1 tablet (100 mg total) by mouth daily.  . SUMAtriptan (IMITREX) 100 MG tablet Take 1 tablet (100 mg total) by mouth once as needed for migraine. May repeat in 2 hours if headache persists or recurs.  . traZODone (DESYREL) 150 MG tablet Take 1 tablet (150 mg total) by mouth at bedtime.  Marland Kitchen. venlafaxine XR (EFFEXOR XR) 37.5 MG 24 hr capsule  Take 1 capsule (37.5 mg total) by mouth daily.  Marland Kitchen dicyclomine (BENTYL) 10 MG capsule Take 1 capsule (10 mg total) by mouth 3 (three) times daily as needed for spasms (diarrhea).  . drospirenone-ethinyl estradiol (OCELLA) 3-0.03 MG tablet Take 1 tablet by mouth daily. (Patient not taking: Reported on 06/25/2017)  . norethindrone-ethinyl estradiol (JUNEL FE,GILDESS FE,LOESTRIN FE) 1-20 MG-MCG tablet Take 1 tablet by mouth daily. (Patient not taking: Reported on 06/25/2017)  . pantoprazole (PROTONIX) 40 MG tablet Take 1 tablet (40 mg total) by mouth daily. (Patient not taking:  Reported on 06/25/2017)   No current facility-administered medications on file prior to visit.     ROS: all negative except above.   Physical Exam:  BP 108/72   Pulse 83   Temp (!) 97.5 F (36.4 C)   Ht 5' 4.5" (1.638 m)   Wt 238 lb 6.4 oz (108.1 kg)   LMP 05/28/2017 (Approximate)   SpO2 98%   BMI 40.29 kg/m   General Appearance: Well nourished, appears unwell but stable. Eyes: PERRLA, EOMs, conjunctiva no swelling or erythema Sinuses: No Frontal/maxillary tenderness ENT/Mouth: Ext aud canals clear, TMs without erythema, bulging. No erythema, swelling, or exudate on post pharynx.  Tonsils not swollen or erythematous. Hearing normal.  Neck: Supple.  Respiratory: Respiratory effort normal, BS equal bilaterally without rales, rhonchi, wheezing or stridor.  Cardio: RRR with no MRGs. Brisk peripheral pulses without edema.  Abdomen: Soft, + BS.  Non tender. Musculoskeletal: Strength symmetrical without weakness, normal gait.  Skin: Warm, dry without rashes, lesions, ecchymosis.  Neuro: Cranial nerves intact. Normal muscle tone, no cerebellar symptoms. Sensation intact.  Psych: Awake and oriented X 3, normal affect, Insight and Judgment appropriate.     Dan Maker, NP 3:44 PM [facility]

## 2017-06-25 NOTE — Addendum Note (Signed)
Addended by: Dan MakerORBETT, Obinna Ehresman C on: 06/25/2017 04:45 PM   Modules accepted: Orders

## 2017-06-25 NOTE — Patient Instructions (Addendum)
Start zonegran 50 mg daily, and we will also try verapamil slow release 120 mg - this is a blood pressure medication, but often helps with migraines as well. You can either take this daily, or as you start feeling a migraine coming on.   Start magnesium 250 mg three times a day - this has been known to help migraines as well.    Migraine Headache A migraine headache is an intense, throbbing pain on one side or both sides of the head. Migraines may also cause other symptoms, such as nausea, vomiting, and sensitivity to light and noise. What are the causes? Doing or taking certain things may also trigger migraines, such as:  Alcohol.  Smoking.  Medicines, such as: ? Medicine used to treat chest pain (nitroglycerine). ? Birth control pills. ? Estrogen pills. ? Certain blood pressure medicines.  Aged cheeses, chocolate, or caffeine.  Foods or drinks that contain nitrates, glutamate, aspartame, or tyramine.  Physical activity.  Other things that may trigger a migraine include:  Menstruation.  Pregnancy.  Hunger.  Stress, lack of sleep, too much sleep, or fatigue.  Weather changes.  What increases the risk? The following factors may make you more likely to experience migraine headaches:  Age. Risk increases with age.  Family history of migraine headaches.  Being Caucasian.  Depression and anxiety.  Obesity.  Being a woman.  Having a hole in the heart (patent foramen ovale) or other heart problems.  What are the signs or symptoms? The main symptom of this condition is pulsating or throbbing pain. Pain may:  Happen in any area of the head, such as on one side or both sides.  Interfere with daily activities.  Get worse with physical activity.  Get worse with exposure to bright lights or loud noises.  Other symptoms may include:  Nausea.  Vomiting.  Dizziness.  General sensitivity to bright lights, loud noises, or smells.  Before you get a migraine,  you may get warning signs that a migraine is developing (aura). An aura may include:  Seeing flashing lights or having blind spots.  Seeing bright spots, halos, or zigzag lines.  Having tunnel vision or blurred vision.  Having numbness or a tingling feeling.  Having trouble talking.  Having muscle weakness.  How is this diagnosed? A migraine headache can be diagnosed based on:  Your symptoms.  A physical exam.  Tests, such as CT scan or MRI of the head. These imaging tests can help rule out other causes of headaches.  Taking fluid from the spine (lumbar puncture) and analyzing it (cerebrospinal fluid analysis, or CSF analysis).  How is this treated? A migraine headache is usually treated with medicines that:  Relieve pain.  Relieve nausea.  Prevent migraines from coming back.  Treatment may also include:  Acupuncture.  Lifestyle changes like avoiding foods that trigger migraines.  Follow these instructions at home: Medicines  Take over-the-counter and prescription medicines only as told by your health care provider.  Do not drive or use heavy machinery while taking prescription pain medicine.  To prevent or treat constipation while you are taking prescription pain medicine, your health care provider may recommend that you: ? Drink enough fluid to keep your urine clear or pale yellow. ? Take over-the-counter or prescription medicines. ? Eat foods that are high in fiber, such as fresh fruits and vegetables, whole grains, and beans. ? Limit foods that are high in fat and processed sugars, such as fried and sweet foods. Lifestyle  Avoid  alcohol use.  Do not use any products that contain nicotine or tobacco, such as cigarettes and e-cigarettes. If you need help quitting, ask your health care provider.  Get at least 8 hours of sleep every night.  Limit your stress. General instructions   Keep a journal to find out what may trigger your migraine headaches. For  example, write down: ? What you eat and drink. ? How much sleep you get. ? Any change to your diet or medicines.  If you have a migraine: ? Avoid things that make your symptoms worse, such as bright lights. ? It may help to lie down in a dark, quiet room. ? Do not drive or use heavy machinery. ? Ask your health care provider what activities are safe for you while you are experiencing symptoms.  Keep all follow-up visits as told by your health care provider. This is important. Contact a health care provider if:  You develop symptoms that are different or more severe than your usual migraine symptoms. Get help right away if:  Your migraine becomes severe.  You have a fever.  You have a stiff neck.  You have vision loss.  Your muscles feel weak or like you cannot control them.  You start to lose your balance often.  You develop trouble walking.  You faint. This information is not intended to replace advice given to you by your health care provider. Make sure you discuss any questions you have with your health care provider. Document Released: 08/19/2005 Document Revised: 03/08/2016 Document Reviewed: 02/05/2016 Elsevier Interactive Patient Education  2017 [employer]

## 2017-06-25 NOTE — Progress Notes (Signed)
Patient was given 60mg  of Toradol in right ventrogluteal. Patient tolerated injection well. No questions or concerns.

## 2017-06-26 ENCOUNTER — Encounter: Payer: Self-pay | Admitting: Adult Health

## 2017-06-30 ENCOUNTER — Other Ambulatory Visit: Payer: Self-pay | Admitting: Internal Medicine

## 2017-06-30 ENCOUNTER — Other Ambulatory Visit: Payer: Self-pay | Admitting: Physician Assistant

## 2017-06-30 DIAGNOSIS — F3341 Major depressive disorder, recurrent, in partial remission: Secondary | ICD-10-CM

## 2017-06-30 DIAGNOSIS — Z0001 Encounter for general adult medical examination with abnormal findings: Secondary | ICD-10-CM

## 2017-06-30 DIAGNOSIS — F988 Other specified behavioral and emotional disorders with onset usually occurring in childhood and adolescence: Secondary | ICD-10-CM

## 2017-06-30 MED ORDER — ALPRAZOLAM 1 MG PO TABS: 1.0000 mg | ORAL_TABLET | Freq: Three times a day (TID) | ORAL | 0 refills | Status: DC | PRN

## 2017-06-30 MED ORDER — LISDEXAMFETAMINE DIMESYLATE 60 MG PO CAPS: 60.0000 mg | ORAL_CAPSULE | ORAL | 0 refills | Status: DC

## 2017-06-30 MED FILL — ALPRAZolam 1 MG TABS: 1 | 25 days supply | Qty: 75 | Fill #0

## 2017-06-30 MED FILL — VYVANSE 60 MG CAPSULE: 60 | 30 days supply | Qty: 30 | Fill #0

## 2017-06-30 NOTE — Telephone Encounter (Signed)
Xanax has been called into pharmacy on 29th Oct 2018 by DD 

## 2017-08-05 ENCOUNTER — Other Ambulatory Visit: Payer: Self-pay | Admitting: Physician Assistant

## 2017-08-05 DIAGNOSIS — F988 Other specified behavioral and emotional disorders with onset usually occurring in childhood and adolescence: Secondary | ICD-10-CM

## 2017-08-05 DIAGNOSIS — F3341 Major depressive disorder, recurrent, in partial remission: Secondary | ICD-10-CM

## 2017-08-05 DIAGNOSIS — Z0001 Encounter for general adult medical examination with abnormal findings: Secondary | ICD-10-CM

## 2017-08-05 MED ORDER — LISDEXAMFETAMINE DIMESYLATE 60 MG PO CAPS: 60.0000 mg | ORAL_CAPSULE | ORAL | 0 refills | Status: DC

## 2017-08-05 MED ORDER — ALPRAZOLAM 1 MG PO TABS: 1.0000 mg | ORAL_TABLET | Freq: Three times a day (TID) | ORAL | 0 refills | Status: DC | PRN

## 2017-08-08 MED FILL — ALPRAZolam 1 MG TABS: 1 | 25 days supply | Qty: 75 | Fill #0

## 2017-08-14 ENCOUNTER — Other Ambulatory Visit: Payer: Self-pay

## 2017-08-14 DIAGNOSIS — F988 Other specified behavioral and emotional disorders with onset usually occurring in childhood and adolescence: Secondary | ICD-10-CM

## 2017-08-14 DIAGNOSIS — F3341 Major depressive disorder, recurrent, in partial remission: Secondary | ICD-10-CM

## 2017-08-14 DIAGNOSIS — Z0001 Encounter for general adult medical examination with abnormal findings: Secondary | ICD-10-CM

## 2017-08-14 MED ORDER — LISDEXAMFETAMINE DIMESYLATE 60 MG PO CAPS: 60.0000 mg | ORAL_CAPSULE | ORAL | 0 refills | Status: DC

## 2017-08-14 MED FILL — VYVANSE 60 MG CAPSULE: 60 | 30 days supply | Qty: 30 | Fill #0

## 2017-09-02 ENCOUNTER — Telehealth: Payer: 59 | Admitting: Family

## 2017-09-02 DIAGNOSIS — R05 Cough: Secondary | ICD-10-CM

## 2017-09-02 DIAGNOSIS — R059 Cough, unspecified: Secondary | ICD-10-CM

## 2017-09-02 MED ORDER — BENZONATATE 100 MG PO CAPS: 100.0000 mg | ORAL_CAPSULE | Freq: Two times a day (BID) | ORAL | 0 refills | Status: DC | PRN

## 2017-09-02 MED ORDER — PREDNISONE 10 MG (21) PO TBPK: ORAL_TABLET | ORAL | 0 refills | Status: DC

## 2017-09-02 NOTE — Progress Notes (Signed)
We are sorry that you are not feeling well.  Here is how we plan to help!  Based on your presentation I believe you most likely have A cough due to a virus.  This is called viral bronchitis and is best treated by rest, plenty of fluids and control of the cough.  You may use Ibuprofen or Tylenol as directed to help your symptoms.     In addition you may use A prescription cough medication called Tessalon Perles 100mg . You may take 1-2 capsules every 8 hours as needed for your cough. Unfortunately, we cannot prescribe controlled medications through e-visit.   Sterapred 10 mg dosepak  From your responses in the eVisit questionnaire you describe inflammation in the upper respiratory tract which is causing a significant cough.  This is commonly called Bronchitis and has four common causes:    Allergies  Viral Infections  Acid Reflux  Bacterial Infection Allergies, viruses and acid reflux are treated by controlling symptoms or eliminating the cause. An example might be a cough caused by taking certain blood pressure medications. You stop the cough by changing the medication. Another example might be a cough caused by acid reflux. Controlling the reflux helps control the cough.  USE OF BRONCHODILATOR ("RESCUE") INHALERS: There is a risk from using your bronchodilator too frequently.  The risk is that over-reliance on a medication which only relaxes the muscles surrounding the breathing tubes can reduce the effectiveness of medications prescribed to reduce swelling and congestion of the tubes themselves.  Although you feel brief relief from the bronchodilator inhaler, your asthma may actually be worsening with the tubes becoming more swollen and filled with mucus.  This can delay other crucial treatments, such as oral steroid medications. If you need to use a bronchodilator inhaler daily, several times per day, you should discuss this with your provider.  There are probably better treatments that could  be used to keep your asthma under control.     HOME CARE . Only take medications as instructed by your medical team. . Complete the entire course of an antibiotic. . Drink plenty of fluids and get plenty of rest. . Avoid close contacts especially the very young and the elderly . Cover your mouth if you cough or cough into your sleeve. . Always remember to wash your hands . A steam or ultrasonic humidifier can help congestion.   GET HELP RIGHT AWAY IF: . You develop worsening fever. . You become short of breath . You cough up blood. . Your symptoms persist after you have completed your treatment plan MAKE SURE YOU   Understand these instructions.  Will watch your condition.  Will get help right away if you are not doing well or get worse.  Your e-visit answers were reviewed by a board certified advanced clinical practitioner to complete your personal care plan.  Depending on the condition, your plan could have included both over the counter or prescription medications. If there is a problem please reply  once you have received a response from your provider. Your safety is important to us.  If you have drug allergies check your prescription carefully.    You can use MyChart to ask questions about today's visit, request a non-urgent call back, or ask for a work or school excuse for 24 hours related to this e-Visit. If it has been greater than 24 hours you will need to follow up with your provider, or enter a new e-Visit to address those concerns. You will get  an e-mail in the next two days asking about your experience.  I hope that your e-visit has been valuable and will speed your recovery. Thank you for using e-visits.

## 2017-09-03 ENCOUNTER — Other Ambulatory Visit: Payer: Self-pay | Admitting: Physician Assistant

## 2017-09-03 ENCOUNTER — Encounter: Payer: Self-pay | Admitting: Physician Assistant

## 2017-09-03 MED ORDER — PROMETHAZINE-DM 6.25-15 MG/5ML PO SYRP: 5.0000 mL | ORAL_SOLUTION | Freq: Four times a day (QID) | ORAL | 1 refills | Status: DC | PRN

## 2017-09-08 ENCOUNTER — Other Ambulatory Visit: Payer: Self-pay | Admitting: Physician Assistant

## 2017-09-08 DIAGNOSIS — G43809 Other migraine, not intractable, without status migrainosus: Secondary | ICD-10-CM

## 2017-09-08 MED ORDER — ALPRAZOLAM 1 MG PO TABS: 1.0000 mg | ORAL_TABLET | Freq: Three times a day (TID) | ORAL | 0 refills | Status: DC | PRN

## 2017-09-08 MED FILL — SUMATRIPTAN SUCC 100 MG TAB: 100 | 30 days supply | Qty: 8 | Fill #0

## 2017-09-08 MED FILL — ALPRAZolam 1 MG TABS: 1 | 25 days supply | Qty: 75 | Fill #0

## 2017-09-16 ENCOUNTER — Other Ambulatory Visit: Payer: Self-pay | Admitting: Physician Assistant

## 2017-09-16 DIAGNOSIS — F3341 Major depressive disorder, recurrent, in partial remission: Secondary | ICD-10-CM

## 2017-09-16 DIAGNOSIS — F988 Other specified behavioral and emotional disorders with onset usually occurring in childhood and adolescence: Secondary | ICD-10-CM

## 2017-09-16 DIAGNOSIS — Z0001 Encounter for general adult medical examination with abnormal findings: Secondary | ICD-10-CM

## 2017-09-17 MED ORDER — LISDEXAMFETAMINE DIMESYLATE 60 MG PO CAPS: 60.0000 mg | ORAL_CAPSULE | ORAL | 0 refills | Status: DC

## 2017-09-17 MED FILL — traZODone HCL 150 MG TABS: 150 | 90 days supply | Qty: 90 | Fill #0

## 2017-09-17 MED FILL — VYVANSE 60 MG CAPSULE: 60 | 30 days supply | Qty: 30 | Fill #0

## 2017-09-24 DIAGNOSIS — F988 Other specified behavioral and emotional disorders with onset usually occurring in childhood and adolescence: Secondary | ICD-10-CM | POA: Insufficient documentation

## 2017-09-24 NOTE — Progress Notes (Deleted)
Complete Physical  Assessment and Plan:  Morbid obesity (BMI 40.37) - long discussion about weight loss, diet, and exercise -     TSH  Other abnormal glucose Discussed general issues about diabetes pathophysiology and management., Educational material distributed., Suggested low cholesterol diet., Encouraged aerobic exercise., Discussed foot care., Reminded to get yearly retinal exam. -     TSH -     Hemoglobin A1c -     Urinalysis, Routine w reflex microscopic (not at [facility]) -     Microalbumin / creatinine urine ratio -     EKG 12-Lead  Other migraine without status migrainosus, not intractable  continue meds, avoid triggers  Kidney stones Increase fluids  Anxiety -     sertraline (ZOLOFT) 100 MG tablet; Take 1 tablet (100 mg total) by mouth daily.  Insomnia, unspecified type Continue medications  Recurrent major depressive disorder, in partial remission (HCC) -     lisdexamfetamine (VYVANSE) 60 MG capsule; Take 1 capsule (60 mg total) by mouth every morning. -     sertraline (ZOLOFT) 100 MG tablet; Take 1 tablet (100 mg total) by mouth daily.  Vitamin D deficiency -     VITAMIN D 25 Hydroxy (Vit-D Deficiency, Fractures)  Vaginal discharge - check labs -     GC/Chlamydia Probe Amp -     WET PREP BY MOLECULAR PROBE  Screen for STD (sexually transmitted disease) -     HIV antibody -     Hepatitis C antibody -     RPR -     HSV(herpes simplex vrs) 1+2 ab-IgG  Medication management -     CBC with Differential/Platelet -     BASIC METABOLIC PANEL WITH GFR -     Hepatic function panel -     Magnesium  Encounter for general adult medical examination with abnormal findings -     lisdexamfetamine (VYVANSE) 60 MG capsule; Take 1 capsule (60 mg total) by mouth every morning. -     sertraline (ZOLOFT) 100 MG tablet; Take 1 tablet (100 mg total) by mouth daily. -     CBC with Differential/Platelet -     BASIC METABOLIC PANEL WITH GFR -     Hepatic function panel -      TSH -     Lipid panel -     Hemoglobin A1c -     Magnesium -     VITAMIN D 25 Hydroxy (Vit-D Deficiency, Fractures) -     Urinalysis, Routine w reflex microscopic (not at [facility]) -     Microalbumin / creatinine urine ratio -     EKG 12-Lead -     HIV antibody -     Hepatitis C antibody -     RPR -     HSV(herpes simplex vrs) 1+2 ab-IgG  Attention deficit disorder, unspecified hyperactivity presence -     lisdexamfetamine (VYVANSE) 60 MG capsule; Take 1 capsule (60 mg total) by mouth every morning.  Hyperlipidemia, unspecified hyperlipidemia type -     Lipid panel -     EKG 12-Lead   Discussed med's effects and SE's. Screening labs and tests as requested with regular follow-up as recommended. Over 40 minutes of exam, counseling, chart review, and complex, high level critical decision making was performed this visit.   HPI  47 y.o. female  presents for a complete physical and follow up for has Anxiety; Migraines; Kidney stones; Insomnia; Depression; Morbid obesity (BMI 40.37); Other abnormal glucose; and Vitamin D deficiency on  their problem list..  Her blood pressure has been controlled at home, today their BP is   She does not workout. She denies chest pain, shortness of breath, dizziness.   She is not on cholesterol medication and denies myalgias. Her cholesterol is not at goal. The cholesterol last visit was:   Lab Results  Component Value Date   CHOL 209 (H) 01/01/2017   HDL 48 (L) 01/01/2017   LDLCALC 125 (H) 01/01/2017   TRIG 179 (H) 01/01/2017   CHOLHDL 4.4 01/01/2017   She has been working on diet and exercise for prediabetes, and denies hypoglycemia , paresthesia of the feet, polydipsia, polyuria and visual disturbances. Last A1C in the office was:  Lab Results  Component Value Date   HGBA1C 5.2 06/05/2016   Patient is on Vitamin D supplement.   Lab Results  Component Value Date   VD25OH 36 06/05/2016     BMI is There is no height or weight on file to calculate  BMI., she is working on diet and exercise. Wt Readings from Last 3 Encounters:  06/25/17 238 lb 6.4 oz (108.1 kg)  05/01/17 227 lb (103 kg)  04/09/17 225 lb 9.6 oz (102.3 kg)   Patient is on an ADD medication, she states that the medication is helping and she denies any adverse reactions. She is doing very well on the vyvanse, very good focus without physical symptoms however she would have to take a second one around 1-2 pm.  She has anxiety/depression controlled with zoloft, xanax 2 at night and on trazodone at night , has been off zoloft so has been having some decreased motivation. NEED TO CUT BACK ON XANAX  Current Medications:  Current Outpatient Medications on File Prior to Visit  Medication Sig Dispense Refill  . ALPRAZolam (XANAX) 1 MG tablet Take 1 tablet (1 mg total) by mouth 3 (three) times daily as needed for anxiety. 75 tablet 0  . benzonatate (TESSALON) 100 MG capsule Take 1 capsule (100 mg total) by mouth 2 (two) times daily as needed for cough. 20 capsule 0  . dicyclomine (BENTYL) 10 MG capsule Take 1 capsule (10 mg total) by mouth 3 (three) times daily as needed for spasms (diarrhea). 30 capsule 0  . drospirenone-ethinyl estradiol (OCELLA) 3-0.03 MG tablet Take 1 tablet by mouth daily. (Patient not taking: Reported on 06/25/2017) 1 Package 3  . lisdexamfetamine (VYVANSE) 60 MG capsule Take 1 capsule (60 mg total) by mouth every morning. 30 capsule 0  . metoCLOPramide (REGLAN) 5 MG tablet Take 1 tablet (5 mg total) by mouth every 8 (eight) hours as needed for nausea (migraines). 20 tablet 0  . norethindrone-ethinyl estradiol (JUNEL FE,GILDESS FE,LOESTRIN FE) 1-20 MG-MCG tablet Take 1 tablet by mouth daily. (Patient not taking: Reported on 06/25/2017) 1 Package 11  . ondansetron (ZOFRAN) 8 MG tablet 1/2-1 tablet every 6 hours as needed for nausea/vomiting 28 tablet 0  . pantoprazole (PROTONIX) 40 MG tablet Take 1 tablet (40 mg total) by mouth daily. (Patient not taking: Reported  on 06/25/2017) 14 tablet 0  . predniSONE (STERAPRED UNI-PAK 21 TAB) 10 MG (21) TBPK tablet AS DIRECTED 21 tablet 0  . promethazine-dextromethorphan (PROMETHAZINE-DM) 6.25-15 MG/5ML syrup Take 5 mLs by mouth 4 (four) times daily as needed for cough. 240 mL 1  . sertraline (ZOLOFT) 100 MG tablet Take 1 tablet (100 mg total) by mouth daily. 90 tablet 0  . SUMAtriptan (IMITREX) 100 MG tablet TAKE 1 TABLET BY MOUTH AS NEEDED FOR MIGRAINE, MAY  REPEAT IN 2 HOURS IF HEADACHE PERSISTS OR RECURS 10 tablet 2  . traZODone (DESYREL) 150 MG tablet TAKE 1 TABLET BY MOUTH AT BEDTIME. 90 tablet 1  . venlafaxine XR (EFFEXOR XR) 37.5 MG 24 hr capsule Take 1 capsule (37.5 mg total) by mouth daily. 30 capsule 2  . verapamil (CALAN-SR) 120 MG CR tablet Take 1 tablet (120 mg total) by mouth daily. 30 tablet 11  . zonisamide (ZONEGRAN) 50 MG capsule Take 1 capsule (50 mg total) by mouth daily. 30 capsule 3   No current facility-administered medications on file prior to visit.    Allergies:  Allergies  Allergen Reactions  . Sulfa Antibiotics Itching  . Amoxicillin Rash  . Penicillins Rash   Medical History:  She has Anxiety; Migraines; Kidney stones; Insomnia; Depression; Morbid obesity (BMI 40.37); Other abnormal glucose; and Vitamin D deficiency on their problem list.   Health Maintenance:   Immunization History  Administered Date(s) Administered  . Influenza,inj,Quad PF,6+ Mos 06/04/2016  . Pneumococcal-Unspecified 09/02/1996  . Td 10/05/2002   Tetanus:2004 DUE Pneumovax: 1998 Prevnar 13:  Flu vaccine: 2017 at work Zostavax:  No LMP recorded. Pap: 10/2015 Dr. Audie Box MGM: never had MGM, needs  DEXA: N/A Colonoscopy: N/A EGD: N/A CXR 2016  Patient Care Team: Quentin Mulling, Cordelia Poche as PCP - General (Physician Assistant)  Surgical History:  She has a past surgical history that includes Cesarean section and Pelvic laparoscopy (2004). Family History:  Herfamily history includes ADD / ADHD in  her daughter; Autism in her son; Celiac disease in her sister; Depression in her father; Hypertension in her mother; Migraines in her father and mother. Social History:  She reports that she quit smoking about 14 years ago. she has never used smokeless tobacco. She reports that she drinks alcohol. She reports that she does not use drugs.  Review of Systems: Review of Systems  Constitutional: Negative.   HENT: Negative.   Eyes: Negative.   Respiratory: Negative.   Cardiovascular: Negative.   Gastrointestinal: Negative.   Genitourinary: Negative.   Musculoskeletal: Negative.   Skin: Negative.   Neurological: Negative.   Endo/Heme/Allergies: Negative.   Psychiatric/Behavioral: Negative.     Physical Exam: Estimated body mass index is 40.29 kg/m as calculated from the following:   Height as of 06/25/17: 5' 4.5" (1.638 m).   Weight as of 06/25/17: 238 lb 6.4 oz (108.1 kg). There were no vitals taken for this visit. General Appearance: Well nourished, in no apparent distress.  Eyes: PERRLA, EOMs, conjunctiva no swelling or erythema, normal fundi and vessels.  Sinuses: No Frontal/maxillary tenderness  ENT/Mouth: Ext aud canals clear, normal light reflex with TMs without erythema, bulging. Good dentition. No erythema, swelling, or exudate on post pharynx. Tonsils not swollen or erythematous. Hearing normal.  Neck: Supple, thyroid normal. No bruits  Respiratory: Respiratory effort normal, BS equal bilaterally without rales, rhonchi, wheezing or stridor.  Cardio: RRR without murmurs, rubs or gallops. Brisk peripheral pulses without edema.  Chest: symmetric, with normal excursions and percussion.  Breasts: Symmetric, without lumps, nipple discharge, retractions.  Abdomen: Soft, nontender, no guarding, rebound, hernias, masses, or organomegaly.  Lymphatics: Non tender without lymphadenopathy.  Genitourinary:  Musculoskeletal: Full ROM all peripheral extremities,5/5 strength, and normal  gait.  Skin: Warm, dry without rashes, lesions, ecchymosis. Neuro: Cranial nerves intact, reflexes equal bilaterally. Normal muscle tone, no cerebellar symptoms. Sensation intact.  Psych: Awake and oriented X 3, normal affect, Insight and Judgment appropriate.   EKG: WNL no ST changes. AORTA SCAN:  defer  Quentin Mulling 7:30 AM [facility]

## 2017-09-25 ENCOUNTER — Encounter: Payer: Self-pay | Admitting: Physician Assistant

## 2017-10-02 ENCOUNTER — Ambulatory Visit (INDEPENDENT_AMBULATORY_CARE_PROVIDER_SITE_OTHER): Payer: 59 | Admitting: Internal Medicine

## 2017-10-02 ENCOUNTER — Encounter: Payer: Self-pay | Admitting: Internal Medicine

## 2017-10-02 ENCOUNTER — Other Ambulatory Visit: Payer: Self-pay | Admitting: Physician Assistant

## 2017-10-02 DIAGNOSIS — Z0001 Encounter for general adult medical examination with abnormal findings: Secondary | ICD-10-CM

## 2017-10-02 DIAGNOSIS — R059 Cough, unspecified: Secondary | ICD-10-CM

## 2017-10-02 DIAGNOSIS — J45901 Unspecified asthma with (acute) exacerbation: Secondary | ICD-10-CM | POA: Insufficient documentation

## 2017-10-02 DIAGNOSIS — F3341 Major depressive disorder, recurrent, in partial remission: Secondary | ICD-10-CM

## 2017-10-02 DIAGNOSIS — F419 Anxiety disorder, unspecified: Secondary | ICD-10-CM

## 2017-10-02 DIAGNOSIS — R05 Cough: Secondary | ICD-10-CM

## 2017-10-02 MED ORDER — FLUTICASONE FUROATE-VILANTEROL 100-25 MCG/INH IN AEPB: 1.0000 | INHALATION_SPRAY | Freq: Every day | RESPIRATORY_TRACT | 0 refills | Status: DC

## 2017-10-02 MED ORDER — PROMETHAZINE-CODEINE 6.25-10 MG/5ML PO SYRP: 5.0000 mL | ORAL_SOLUTION | ORAL | 0 refills | Status: AC | PRN

## 2017-10-02 MED FILL — PROMETHAZINE W/COD SYRUP: 6.25-10 | 4 days supply | Qty: 120 | Fill #0

## 2017-10-02 NOTE — Progress Notes (Signed)
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 Asked to be evaluated for acute onset cough. She feels that she caught respiratory infection 2 weeks ago, waking with cough productive yellow brown sputum, weakness, sore throat. No definite fever and no chills. GI okay. Had had flu vaccine shortly before this.  Took a breathing treatment yesterday and did have some relief, Trouble sleeping with the cough No definite history of asthma.  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  Managing as cough equivalent asthma with GERD ----ACUTE VISIT: PT las tseen 06-12-2016; Pt here for cough-dry. Pt notes drainage as well. Was seen via E-visit through [facility]-given meds that are not helping.  Waking at night with acute coughing paroxysms for which she has to sit up.  Dry cough during the day.  Sometimes hard to get air in and out comfortably.  No fever or chest pain, purulent sputum, palpitations or blood.  Significant stress now-separating from ex and closing on a house.  ROS-see HPI   + = positive Constitutional:    weight loss, night sweats, fevers, chills, fatigue, lassitude. HEENT:    headaches, difficulty swallowing, tooth/dental problems, sore throat,       sneezing, itching, ear ache, nasal congestion, post nasal drip, snoring CV:    chest pain, orthopnea, PND, swelling in lower extremities, anasarca,                                             dizziness, palpitations Resp:   shortness of breath with exertion or at rest.                productive cough,   non-productive cough, coughing up of blood.              change in color of mucus.  wheezing.   Skin:    rash or lesions. GI:  No-   heartburn, indigestion, abdominal pain, nausea, vomiting, diarrhea,                 change in bowel habits, loss of appetite GU: dysuria, change in color of urine, no urgency or frequency.   flank pain. MS:   joint pain, stiffness, decreased range of motion, back  pain. Neuro-     nothing unusual Psych:  change in mood or affect.  depression or anxiety.   memory loss.  OBJ- Physical Exam General- Alert, Oriented, Affect-appropriate, Distress- none acute, +obese Skin- rash-none, lesions- none, excoriation- none Lymphadenopathy- none Head- atraumatic            Eyes- Gross vision intact, PERRLA, conjunctivae and secretions clear            Ears- Hearing, canals-normal            Nose- Clear, no-Septal dev, mucus, polyps, erosion, perforation             Throat- Mallampati II , mucosa clear , drainage- none, tonsils- atrophic Neck- flexible , trachea midline, no stridor , thyroid nl, carotid no bruit Chest - symmetrical excursion , unlabored           Heart/CV- RRR-rapid , no murmur , no gallop  , no rub, nl s1 s2                           - JVD- none , edema- none, stasis  changes- none, varices- none           Lung- clear to P&A, wheeze- none, cough+ light , dullness-none, rub- none           Chest wall-  Abd-  Br/ Gen/ Rectal- Not done, not indicated Extrem- cyanosis- none, clubbing, none, atrophy- none, strength- nl Neuro- grossly intact to observation

## 2017-10-02 NOTE — Patient Instructions (Signed)
Script printed codeine cough syrup- use sparingly  Try using 2 of the benzonatate perles     Total 200 mg every 8 hours if needed for cough  Sample Breo 100      Inhale 1 puff then rinse mouth, once daily  Elevate the head legs of your bed- 1 brick under each- to reduce reflux at night  Try adding otc Pepcid 10 mg daily before breakfast. This will reduce irritation from acid reflux

## 2017-10-02 NOTE — Addendum Note (Signed)
Addended by: Reynaldo MiniumWELCHEL, Rozena Fierro C on: 10/02/2017 05:06 PM   Modules accepted: Orders

## 2017-10-02 NOTE — Assessment & Plan Note (Signed)
Paroxysmal cough that wakes her from sleep strongly suggest possibility of reflux although she does not feel heartburn.  A cough equivalent asthma pattern is not excluded as well. Plan-Phenergan with codeine cough syrup, try taking Tessalon 200 mg, using her residual supply of 100 mg capsules.  Sample Breo 100,  Elevate head of bed on brick to reduce possibility of reflux and try adding Pepcid OTC in the morning.

## 2017-10-03 ENCOUNTER — Other Ambulatory Visit (INDEPENDENT_AMBULATORY_CARE_PROVIDER_SITE_OTHER): Payer: Self-pay

## 2017-10-03 ENCOUNTER — Telehealth: Payer: Self-pay | Admitting: Internal Medicine

## 2017-10-03 DIAGNOSIS — F419 Anxiety disorder, unspecified: Secondary | ICD-10-CM

## 2017-10-03 DIAGNOSIS — F3341 Major depressive disorder, recurrent, in partial remission: Secondary | ICD-10-CM

## 2017-10-03 DIAGNOSIS — Z0001 Encounter for general adult medical examination with abnormal findings: Secondary | ICD-10-CM

## 2017-10-03 MED ORDER — BENZONATATE 200 MG PO CAPS: 200.0000 mg | ORAL_CAPSULE | Freq: Three times a day (TID) | ORAL | 3 refills | Status: DC | PRN

## 2017-10-03 MED FILL — BENZONATATE 200 MG CAP: 200 | 13 days supply | Qty: 40 | Fill #0

## 2017-10-03 MED FILL — SERTRALINE HCL 100 MG TAB: 100 | 30 days supply | Qty: 30 | Fill #0

## 2017-10-03 NOTE — Telephone Encounter (Signed)
Spoke with pt who stated she had a visit with CY yesterday, 10/02/17 and was told to take benzonatate as needed for her cough.  Pt stated at the time of visit, she told them she thought she still had some benzonatate left and did not need a new Rx but when pt arrived home, pt did not have but one pill left.  Pt stated [facility] and thought Florentina AddisonKatie was going to send a new Rx in to pt's pharmacy but one was not sent in.  Verified with CY the dosage, quantity and if any refills.  Rx sent to pt's preferred pharmacy and pt made aware.  Nothing further needed at this current time.

## 2017-10-04 MED ORDER — SERTRALINE HCL 100 MG PO TABS: ORAL_TABLET | ORAL | 0 refills | Status: DC

## 2017-10-15 ENCOUNTER — Encounter: Payer: Self-pay | Admitting: Physician Assistant

## 2017-10-15 ENCOUNTER — Other Ambulatory Visit: Payer: Self-pay | Admitting: Physician Assistant

## 2017-10-16 MED ORDER — ALPRAZOLAM 1 MG PO TABS: 1.0000 mg | ORAL_TABLET | Freq: Three times a day (TID) | ORAL | 0 refills | Status: DC | PRN

## 2017-10-16 MED FILL — ALPRAZolam 1 MG TABS: 1 | 25 days supply | Qty: 75 | Fill #0

## 2017-10-22 ENCOUNTER — Telehealth: Payer: Self-pay | Admitting: Internal Medicine

## 2017-10-22 MED ORDER — OSELTAMIVIR PHOSPHATE 75 MG PO CAPS: 75.0000 mg | ORAL_CAPSULE | Freq: Two times a day (BID) | ORAL | 0 refills | Status: DC

## 2017-10-22 NOTE — Telephone Encounter (Signed)
Pt called back, requesting rx of Tamiflu and any other recs that CY can recommend. Pt c/o body aches, HA, stuffy nose, chills, fatigue, sore throat, temp of 101.  Pt notes that her mother tested positive for flu on Monday.    [facility] uses [facility] on [address]  CY please advise on recs.  Thanks!

## 2017-10-22 NOTE — Telephone Encounter (Signed)
Offer Tamiflu 75 mg, # 10, 1 twice daily  Stay well-hydrated  Ok to use an otc symptom remedy like Mucinex-DM or  TheraFlu, if she wants to try it.

## 2017-10-22 NOTE — Telephone Encounter (Signed)
atc pt, no answer and vm full. tamiflu sent to preferred pharmacy.

## 2017-10-22 NOTE — Telephone Encounter (Signed)
Marie DavenportShelby called back and she knows.

## 2017-10-22 NOTE — Telephone Encounter (Signed)
atc pt, no answer and no vm.  Wcb.  

## 2017-10-27 ENCOUNTER — Ambulatory Visit: Payer: 59 | Admitting: Physician Assistant

## 2017-10-27 ENCOUNTER — Encounter: Payer: Self-pay | Admitting: Physician Assistant

## 2017-10-27 VITALS — BP 110/76 | HR 94 | Temp 97.7°F | Ht 64.5 in | Wt 267.2 lb

## 2017-10-27 DIAGNOSIS — F988 Other specified behavioral and emotional disorders with onset usually occurring in childhood and adolescence: Secondary | ICD-10-CM | POA: Diagnosis not present

## 2017-10-27 DIAGNOSIS — F3341 Major depressive disorder, recurrent, in partial remission: Secondary | ICD-10-CM

## 2017-10-27 DIAGNOSIS — G43819 Other migraine, intractable, without status migrainosus: Secondary | ICD-10-CM | POA: Diagnosis not present

## 2017-10-27 DIAGNOSIS — J111 Influenza due to unidentified influenza virus with other respiratory manifestations: Secondary | ICD-10-CM | POA: Diagnosis not present

## 2017-10-27 NOTE — Progress Notes (Signed)
Subjective:    Patient ID: Marie Ingram, female    DOB: 02-09-1971, 47 y.o.   MRN: [id number]  HPI 47 y.o. obese WF with history of migraines, insomnia, depression/anxiety, cough, ADD presents with frequent sickness. Had Evisit 09/02/2017, saw Dr. Maple Hudson 10/02/2017, works for them, had flu like symptoms 10/22/2017, mom was diagnosed, got tamiflu. She continued to have a fever and started to do FMLA through them but he said to do it here.   We discussed over use of xanax, will get only 60 tablets for a month.   BMI is Body mass index is 45.16 kg/m., she is working on diet and exercise. Wt Readings from Last 3 Encounters:  10/27/17 267 lb 3.2 oz (121.2 kg)  10/02/17 246 lb (111.6 kg)  06/25/17 238 lb 6.4 oz (108.1 kg)    Blood pressure 110/76, pulse 94, temperature 97.7 F (36.5 C), height 5' 4.5" (1.638 m), weight 267 lb 3.2 oz (121.2 kg), SpO2 97 %.  Medications Current Outpatient Medications on File Prior to Visit  Medication Sig  . ALPRAZolam (XANAX) 1 MG tablet Take 1 tablet (1 mg total) by mouth 3 (three) times daily as needed for anxiety.  . benzonatate (TESSALON) 200 MG capsule Take 1 capsule (200 mg total) by mouth every 8 (eight) hours as needed for cough.  . lisdexamfetamine (VYVANSE) 60 MG capsule Take 1 capsule (60 mg total) by mouth every morning.  . sertraline (ZOLOFT) 100 MG tablet Take 1 tablet daily for Mood - will need Office Visit before further refills  . SUMAtriptan (IMITREX) 100 MG tablet TAKE 1 TABLET BY MOUTH AS NEEDED FOR MIGRAINE, MAY REPEAT IN 2 HOURS IF HEADACHE PERSISTS OR RECURS  . traZODone (DESYREL) 150 MG tablet TAKE 1 TABLET BY MOUTH AT BEDTIME.  . fluticasone furoate-vilanterol (BREO ELLIPTA) 100-25 MCG/INH AEPB Inhale 1 puff into the lungs daily. (Patient not taking: Reported on 10/27/2017)  . oseltamivir (TAMIFLU) 75 MG capsule Take 1 capsule (75 mg total) by mouth 2 (two) times daily. (Patient not taking: Reported on 10/27/2017)   No current  facility-administered medications on file prior to visit.     Problem list She has Anxiety; Migraines; Kidney stones; Insomnia; Depression; Morbid obesity (BMI 40.37); Other abnormal glucose; Vitamin D deficiency; ADD (attention deficit disorder); and Cough on their problem list.  Review of Systems  Constitutional: Negative.   HENT: Negative.   Respiratory: Negative.   Cardiovascular: Negative.   Gastrointestinal: Negative.   Genitourinary: Negative.   Musculoskeletal: Negative.   Skin: Negative.   Neurological: Negative.   Hematological: Negative.   Psychiatric/Behavioral: Negative.        Objective:   Physical Exam  Constitutional: She appears well-developed and well-nourished.  HENT:  Head: Normocephalic and atraumatic.  Right Ear: External ear normal.  Nose: Right sinus exhibits no frontal sinus tenderness. Left sinus exhibits no frontal sinus tenderness.  Eyes: Conjunctivae and EOM are normal.  Neck: Normal range of motion. Neck supple.  + TMJ  Cardiovascular: Normal rate, regular rhythm, normal heart sounds and intact distal pulses.  Pulmonary/Chest: Effort normal and breath sounds normal. No respiratory distress. She has no wheezes.  Abdominal: Soft. Bowel sounds are normal.  obese  Lymphadenopathy:    She has no cervical adenopathy.  Skin: Skin is warm and dry.       Assessment & Plan:   Other migraine without status migrainosus, intractable Has improved Continue meds  Recurrent major depressive disorder, in partial remission (HCC) Continue medications LONG discussion  cutting back on xanax- will cut back to 60 pills a month Patient is agreeable, does not want any other meds at this time  Attention deficit disorder, unspecified hyperactivity presence Continue vyvanse.   Morbid obesity (BMI 40.37) - follow up 3 months for progress monitoring - increase veggies, decrease carbs - long discussion about weight loss, diet, and exercise - fill out questions  and send back  Influenza Need out of work 10/22/2017 and can return today 10/27/2017 for influenza.   NEED CPE in MAY

## 2017-10-27 NOTE — Patient Instructions (Addendum)
New guidelines suggest the benzodiazepines are best short term, with prolonged use they lead to physical and psychological dependence. In addition, evidence suggest that for insomnia the effectiveness wanes in 4 weeks and the risks out weight their benefits. Use of these agents have been associated with dementia, falls, motor vehicle accidents and physical addiction. Decreasing these medication have been proven to show improvements in cognition, alertness, decrease of falls and daytime sedation.   We will start a slow taper, symptoms of withdrawal include, insomnia, anxiety, irritability, sweating and stomach or intestinal symptoms like diarrhea or nausea.   Take the zoloft daily- get pill box  Will cut back to 60 pills of xanax a month If you need more, we need to adjust your medications.    Fill this out and bring with you to next OV  Let's ask a few questions about food:  In your opinion, what is your trouble area with food?  Do you ever over eat or binge eat?  Do you find yourself eating when you are sad, happy, frustrated? Emotional eating?  Do you eat when you are bored?  How many meals do you eat a day?  Do you eat out often?  Do you work out at all?  How much water do you drink in a day?  Do you drink any juices, sweet tea, sugary coffee, or sodas?  Do you drink any diet soda and if so how much?

## 2017-10-29 ENCOUNTER — Other Ambulatory Visit: Payer: Self-pay | Admitting: Physician Assistant

## 2017-10-29 DIAGNOSIS — Z0001 Encounter for general adult medical examination with abnormal findings: Secondary | ICD-10-CM

## 2017-10-29 DIAGNOSIS — F988 Other specified behavioral and emotional disorders with onset usually occurring in childhood and adolescence: Secondary | ICD-10-CM

## 2017-10-29 DIAGNOSIS — F3341 Major depressive disorder, recurrent, in partial remission: Secondary | ICD-10-CM

## 2017-10-29 MED ORDER — LISDEXAMFETAMINE DIMESYLATE 60 MG PO CAPS: 60.0000 mg | ORAL_CAPSULE | ORAL | 0 refills | Status: DC

## 2017-10-29 MED FILL — VYVANSE 60 MG CAPSULE: 60 | 30 days supply | Qty: 30 | Fill #0

## 2017-11-19 NOTE — Progress Notes (Deleted)
Assessment and Plan:      Further disposition pending results of labs. Discussed med's effects and SE's.   Over 30 minutes of exam, counseling, chart review, and critical decision making was performed.   Future Appointments  Date Time Provider Department Center  11/20/2017 11:00 AM Judd Gaudierorbett, Vinay Ertl, NP [facility] None  01/29/2018  2:00 PM Judd Gaudierorbett, Karyssa Amaral, NP [facility] None    ------------------------------------------------------------------------------------------------------------------   HPI There were no vitals taken for this visit.  47 y.o.female presents for "stomach bug"  Past Medical History:  Diagnosis Date  . Anxiety   . Depression   . Endometriosis   . Insomnia   . Kidney stones   . Migraines      Allergies  Allergen Reactions  . Sulfa Antibiotics Itching  . Amoxicillin Rash  . Penicillins Rash    Current Outpatient Medications on File Prior to Visit  Medication Sig  . ALPRAZolam (XANAX) 1 MG tablet Take 1 tablet (1 mg total) by mouth 3 (three) times daily as needed for anxiety.  . benzonatate (TESSALON) 200 MG capsule Take 1 capsule (200 mg total) by mouth every 8 (eight) hours as needed for cough.  . fluticasone furoate-vilanterol (BREO ELLIPTA) 100-25 MCG/INH AEPB Inhale 1 puff into the lungs daily. (Patient not taking: Reported on 10/27/2017)  . lisdexamfetamine (VYVANSE) 60 MG capsule Take 1 capsule (60 mg total) by mouth every morning.  Marland Kitchen. oseltamivir (TAMIFLU) 75 MG capsule Take 1 capsule (75 mg total) by mouth 2 (two) times daily. (Patient not taking: Reported on 10/27/2017)  . sertraline (ZOLOFT) 100 MG tablet Take 1 tablet daily for Mood - will need Office Visit before further refills  . SUMAtriptan (IMITREX) 100 MG tablet TAKE 1 TABLET BY MOUTH AS NEEDED FOR MIGRAINE, MAY REPEAT IN 2 HOURS IF HEADACHE PERSISTS OR RECURS  . traZODone (DESYREL) 150 MG tablet TAKE 1 TABLET BY MOUTH AT BEDTIME.   No current facility-administered medications on file  prior to visit.     ROS: all negative except above.   Physical Exam:  There were no vitals taken for this visit.  General Appearance: Well nourished, in no apparent distress. Eyes: PERRLA, EOMs, conjunctiva no swelling or erythema Sinuses: No Frontal/maxillary tenderness ENT/Mouth: Ext aud canals clear, TMs without erythema, bulging. No erythema, swelling, or exudate on post pharynx.  Tonsils not swollen or erythematous. Hearing normal.  Neck: Supple, thyroid normal.  Respiratory: Respiratory effort normal, BS equal bilaterally without rales, rhonchi, wheezing or stridor.  Cardio: RRR with no MRGs. Brisk peripheral pulses without edema.  Abdomen: Soft, + BS.  Non tender, no guarding, rebound, hernias, masses. Lymphatics: Non tender without lymphadenopathy.  Musculoskeletal: Full ROM, 5/5 strength, normal gait.  Skin: Warm, dry without rashes, lesions, ecchymosis.  Neuro: Cranial nerves intact. Normal muscle tone, no cerebellar symptoms. Sensation intact.  Psych: Awake and oriented X 3, normal affect, Insight and Judgment appropriate.     Dan MakerAshley C Heaton Sarin, NP 1:34 PM [facility]

## 2017-11-20 ENCOUNTER — Ambulatory Visit: Payer: Self-pay | Admitting: Adult Health

## 2017-11-21 ENCOUNTER — Ambulatory Visit: Payer: Self-pay | Admitting: Physician Assistant

## 2017-11-22 NOTE — Progress Notes (Signed)
Assessment and Plan:  Marie DavenportShelby was seen today for gi problem.  Diagnoses and all orders for this visit:  Viral gastroenteritis Symptoms fully resolved; encouraged hydration, bland diet to be gradually increased as tolerated.  FMLA paperwork filled and faxed per request; she may resume normal work activities.   Further disposition pending results of labs. Discussed med's effects and SE's.   Over 15 minutes of exam, counseling, chart review, and critical decision making was performed.   Future Appointments  Date Time Provider Department Center  01/29/2018  2:00 PM Judd Gaudierorbett, Shaquanda Graves, NP [facility] None    ------------------------------------------------------------------------------------------------------------------   HPI BP 110/76   Pulse 97   Temp 97.9 F (36.6 C)   Ht 5' 4.5" (1.638 m)   Wt 261 lb (118.4 kg)   SpO2 99%   BMI 44.11 kg/m   47 y.o.female presents for "stomach bug" - she reports her son had symptoms 1 week ago, she quickly developed similar symptoms on 11/17/2017 - nausea, vomiting, diarrhea which lasted for 5 days. She is feeling much better but still recovering, feeling a bit weak, tolerating bland diet, has cramps related to menstrual cycle which began last night (takes ibuprofen which makes it tolerable). Reports nausea/emesis/diarrhea fully resolved as of 3 days ago. She is here today to have FMLA paperwork filled out for the last week of work that she missed.  Past Medical History:  Diagnosis Date  . Anxiety   . Depression   . Endometriosis   . Insomnia   . Kidney stones   . Migraines      Allergies  Allergen Reactions  . Sulfa Antibiotics Itching  . Amoxicillin Rash  . Penicillins Rash    Current Outpatient Medications on File Prior to Visit  Medication Sig  . benzonatate (TESSALON) 200 MG capsule Take 1 capsule (200 mg total) by mouth every 8 (eight) hours as needed for cough.  . lisdexamfetamine (VYVANSE) 60 MG capsule Take 1 capsule (60 mg  total) by mouth every morning.  . SUMAtriptan (IMITREX) 100 MG tablet TAKE 1 TABLET BY MOUTH AS NEEDED FOR MIGRAINE, MAY REPEAT IN 2 HOURS IF HEADACHE PERSISTS OR RECURS  . traZODone (DESYREL) 150 MG tablet TAKE 1 TABLET BY MOUTH AT BEDTIME.  . fluticasone furoate-vilanterol (BREO ELLIPTA) 100-25 MCG/INH AEPB Inhale 1 puff into the lungs daily. (Patient not taking: Reported on 10/27/2017)  . oseltamivir (TAMIFLU) 75 MG capsule Take 1 capsule (75 mg total) by mouth 2 (two) times daily. (Patient not taking: Reported on 10/27/2017)   No current facility-administered medications on file prior to visit.     ROS: all negative except above.   Physical Exam:  BP 110/76   Pulse 97   Temp 97.9 F (36.6 C)   Ht 5' 4.5" (1.638 m)   Wt 261 lb (118.4 kg)   SpO2 99%   BMI 44.11 kg/m   General Appearance: Well nourished, in no apparent distress. Eyes: PERRLA, EOMs, conjunctiva no swelling or erythema ENT/Mouth:  No erythema, swelling, or exudate on post pharynx.  Tonsils not swollen or erythematous. Hearing normal.  Neck: Supple.  Respiratory: Respiratory effort normal, BS equal bilaterally without rales, rhonchi, wheezing or stridor.  Cardio: RRR with no MRGs. Brisk peripheral pulses without edema.  Abdomen: Soft, + BS.  Non tender, no guarding, rebound, hernias, masses. Musculoskeletal: Symmetrical strength, normal gait.  Skin: Warm, dry without rashes, lesions, ecchymosis.  Psych: Awake and oriented X 3, normal affect, Insight and Judgment appropriate.     Dan MakerAshley C Niamya Vittitow,  NP 12:20 PM [facility]

## 2017-11-24 ENCOUNTER — Other Ambulatory Visit: Payer: Self-pay | Admitting: Internal Medicine

## 2017-11-24 ENCOUNTER — Other Ambulatory Visit: Payer: Self-pay | Admitting: Physician Assistant

## 2017-11-24 ENCOUNTER — Encounter: Payer: Self-pay | Admitting: Adult Health

## 2017-11-24 ENCOUNTER — Ambulatory Visit: Payer: 59 | Admitting: Adult Health

## 2017-11-24 VITALS — BP 110/76 | HR 97 | Temp 97.9°F | Ht 64.5 in | Wt 261.0 lb

## 2017-11-24 DIAGNOSIS — A084 Viral intestinal infection, unspecified: Secondary | ICD-10-CM

## 2017-11-24 DIAGNOSIS — F3341 Major depressive disorder, recurrent, in partial remission: Secondary | ICD-10-CM

## 2017-11-24 DIAGNOSIS — Z0001 Encounter for general adult medical examination with abnormal findings: Secondary | ICD-10-CM

## 2017-11-24 DIAGNOSIS — F419 Anxiety disorder, unspecified: Secondary | ICD-10-CM

## 2017-11-24 MED ORDER — ALPRAZOLAM 1 MG PO TABS: 1.0000 mg | ORAL_TABLET | Freq: Three times a day (TID) | ORAL | 0 refills | Status: DC | PRN

## 2017-11-24 MED ORDER — SERTRALINE HCL 100 MG PO TABS: ORAL_TABLET | ORAL | 1 refills | Status: DC

## 2017-11-24 MED FILL — ALPRAZolam 1 MG TABS: 1 | 25 days supply | Qty: 75 | Fill #0

## 2017-11-24 MED FILL — SERTRALINE HCL 100 MG TAB: 100 | 30 days supply | Qty: 30 | Fill #0

## 2017-11-24 NOTE — Patient Instructions (Signed)
Viral Gastroenteritis, Adult  Viral gastroenteritis is also known as the stomach flu. This condition is caused by various viruses. These viruses can be passed from person to person very easily (are very contagious). This condition may affect your stomach, small intestine, and large intestine. It can cause sudden watery diarrhea, fever, and vomiting.  Diarrhea and vomiting can make you feel weak and cause you to become dehydrated. You may not be able to keep fluids down. Dehydration can make you tired and thirsty, cause you to have a dry mouth, and decrease how often you urinate. Older adults and people with other diseases or a weak immune system are at higher risk for dehydration.  It is important to replace the fluids that you lose from diarrhea and vomiting. If you become severely dehydrated, you may need to get fluids through an IV tube.  What are the causes?  Gastroenteritis is caused by various viruses, including rotavirus and norovirus. Norovirus is the most common cause in adults.  You can get sick by eating food, drinking water, or touching a surface contaminated with one of these viruses. You can also get sick from sharing utensils or other personal items with an infected person.  What increases the risk?  This condition is more likely to develop in people:  · Who have a weak defense system (immune system).  · Who live with one or more children who are younger than 2 years old.  · Who live in a nursing home.  · Who go on cruise ships.    What are the signs or symptoms?  Symptoms of this condition start suddenly 1-2 days after exposure to a virus. Symptoms may last a few days or as long as a week. The most common symptoms are watery diarrhea and vomiting. Other symptoms include:  · Fever.  · Headache.  · Fatigue.  · Pain in the abdomen.  · Chills.  · Weakness.  · Nausea.  · Muscle aches.  · Loss of appetite.    How is this diagnosed?  This condition is diagnosed with a medical history and physical exam. You  may also have a stool test to check for viruses or other infections.  How is this treated?  This condition typically goes away on its own. The focus of treatment is to restore lost fluids (rehydration). Your health care provider may recommend that you take an oral rehydration solution (ORS) to replace important salts and minerals (electrolytes) in your body. Severe cases of this condition may require giving fluids through an IV tube.  Treatment may also include medicine to help with your symptoms.  Follow these instructions at home:  Follow instructions from your health care provider about how to care for yourself at home.  Eating and drinking  Follow these recommendations as told by your health care provider:  · Take an ORS. This is a drink that is sold at pharmacies and retail stores.  · Drink clear fluids in small amounts as you are able. Clear fluids include water, ice chips, diluted fruit juice, and low-calorie sports drinks.  · Eat bland, easy-to-digest foods in small amounts as you are able. These foods include bananas, applesauce, rice, lean meats, toast, and crackers.  · Avoid fluids that contain a lot of sugar or caffeine, such as energy drinks, sports drinks, and soda.  · Avoid alcohol.  · Avoid spicy or fatty foods.    General instructions    · Drink enough fluid to keep your urine clear or   pale yellow.  · Wash your hands often. If soap and water are not available, use hand sanitizer.  · Make sure that all people in your household wash their hands well and often.  · Take over-the-counter and prescription medicines only as told by your health care provider.  · Rest at home while you recover.  · Watch your condition for any changes.  · Take a warm bath to relieve any burning or pain from frequent diarrhea episodes.  · Keep all follow-up visits as told by your health care provider. This is important.  Contact a health care provider if:  · You cannot keep fluids down.  · Your symptoms get worse.  · You have  new symptoms.  · You feel light-headed or dizzy.  · You have muscle cramps.  Get help right away if:  · You have chest pain.  · You feel extremely weak or you faint.  · You see blood in your vomit.  · Your vomit looks like coffee grounds.  · You have bloody or black stools or stools that look like tar.  · You have a severe headache, a stiff neck, or both.  · You have a rash.  · You have severe pain, cramping, or bloating in your abdomen.  · You have trouble breathing or you are breathing very quickly.  · Your heart is beating very quickly.  · Your skin feels cold and clammy.  · You feel confused.  · You have pain when you urinate.  · You have signs of dehydration, such as:  ? Dark urine, very little urine, or no urine.  ? Cracked lips.  ? Dry mouth.  ? Sunken eyes.  ? Sleepiness.  ? Weakness.  This information is not intended to replace advice given to you by your health care provider. Make sure you discuss any questions you have with your health care provider.  Document Released: 08/19/2005 Document Revised: 01/31/2016 Document Reviewed: 04/25/2015  Elsevier Interactive Patient Education © 2018 [employer]

## 2017-11-26 ENCOUNTER — Encounter: Payer: Self-pay | Admitting: Adult Health

## 2017-12-01 ENCOUNTER — Encounter: Payer: Self-pay | Admitting: Adult Health

## 2017-12-01 MED FILL — SUMATRIPTAN SUCC 100 MG TAB: 100 | 30 days supply | Qty: 8 | Fill #1

## 2017-12-02 ENCOUNTER — Other Ambulatory Visit: Payer: Self-pay | Admitting: Adult Health

## 2017-12-02 DIAGNOSIS — Z0001 Encounter for general adult medical examination with abnormal findings: Secondary | ICD-10-CM

## 2017-12-02 DIAGNOSIS — F3341 Major depressive disorder, recurrent, in partial remission: Secondary | ICD-10-CM

## 2017-12-02 DIAGNOSIS — F988 Other specified behavioral and emotional disorders with onset usually occurring in childhood and adolescence: Secondary | ICD-10-CM

## 2017-12-02 MED ORDER — LISDEXAMFETAMINE DIMESYLATE 60 MG PO CAPS: 60.0000 mg | ORAL_CAPSULE | ORAL | 0 refills | Status: DC

## 2017-12-02 MED ORDER — GABAPENTIN 300 MG PO CAPS: ORAL_CAPSULE | ORAL | 2 refills | Status: DC

## 2017-12-02 MED FILL — VYVANSE 60 MG CAPSULE: 60 | 30 days supply | Qty: 30 | Fill #0

## 2017-12-02 MED FILL — GABAPENTIN 300 MG CAPS: 300 | 30 days supply | Qty: 90 | Fill #0

## 2017-12-11 MED FILL — traZODone HCL 150 MG TABS: 150 | 90 days supply | Qty: 90 | Fill #1

## 2017-12-30 ENCOUNTER — Other Ambulatory Visit: Payer: Self-pay | Admitting: Adult Health

## 2017-12-30 ENCOUNTER — Other Ambulatory Visit: Payer: Self-pay | Admitting: Physician Assistant

## 2017-12-30 DIAGNOSIS — Z0001 Encounter for general adult medical examination with abnormal findings: Secondary | ICD-10-CM

## 2017-12-30 DIAGNOSIS — F988 Other specified behavioral and emotional disorders with onset usually occurring in childhood and adolescence: Secondary | ICD-10-CM

## 2017-12-30 DIAGNOSIS — F3341 Major depressive disorder, recurrent, in partial remission: Secondary | ICD-10-CM

## 2017-12-30 MED ORDER — LISDEXAMFETAMINE DIMESYLATE 60 MG PO CAPS: 60.0000 mg | ORAL_CAPSULE | ORAL | 0 refills | Status: DC

## 2017-12-30 MED ORDER — ALPRAZOLAM 1 MG PO TABS: 1.0000 mg | ORAL_TABLET | Freq: Three times a day (TID) | ORAL | 0 refills | Status: DC | PRN

## 2017-12-30 MED FILL — VYVANSE 60 MG CAPSULE: 60 | 30 days supply | Qty: 30 | Fill #0

## 2017-12-30 MED FILL — ALPRAZolam 1 MG TABS: 1 | 25 days supply | Qty: 75 | Fill #0

## 2018-01-15 ENCOUNTER — Ambulatory Visit: Payer: 59 | Admitting: Internal Medicine

## 2018-01-15 VITALS — BP 124/82 | HR 88 | Temp 97.2°F | Resp 16 | Ht 64.5 in | Wt 264.4 lb

## 2018-01-15 DIAGNOSIS — G43C1 Periodic headache syndromes in child or adult, intractable: Secondary | ICD-10-CM | POA: Diagnosis not present

## 2018-01-15 DIAGNOSIS — G4489 Other headache syndrome: Secondary | ICD-10-CM

## 2018-01-15 DIAGNOSIS — M26623 Arthralgia of bilateral temporomandibular joint: Secondary | ICD-10-CM | POA: Diagnosis not present

## 2018-01-15 DIAGNOSIS — G44209 Tension-type headache, unspecified, not intractable: Secondary | ICD-10-CM

## 2018-01-15 DIAGNOSIS — Z0000 Encounter for general adult medical examination without abnormal findings: Secondary | ICD-10-CM

## 2018-01-15 MED ORDER — PREDNISONE 20 MG PO TABS: ORAL_TABLET | ORAL | 0 refills | Status: DC

## 2018-01-15 MED ORDER — NADOLOL 20 MG PO TABS: ORAL_TABLET | ORAL | 1 refills | Status: DC

## 2018-01-15 MED FILL — predniSONE 20 MG TABS: 20 | 11 days supply | Qty: 20 | Fill #0

## 2018-01-15 MED FILL — NADOLOL 20 MG TAB: 20 | 90 days supply | Qty: 90 | Fill #0

## 2018-01-15 NOTE — Patient Instructions (Signed)
Migraine Headache A migraine headache is an intense, throbbing pain on one side or both sides of the head. Migraines may also cause other symptoms, such as nausea, vomiting, and sensitivity to light and noise. What are the causes? Doing or taking certain things may also trigger migraines, such as:  Alcohol.  Smoking.  Medicines, such as: ? Medicine used to treat chest pain (nitroglycerine). ? Birth control pills. ? Estrogen pills. ? Certain blood pressure medicines.  Aged cheeses, chocolate, or caffeine.  Foods or drinks that contain nitrates, glutamate, aspartame, or tyramine.  Physical activity.  Other things that may trigger a migraine include:  Menstruation.  Pregnancy.  Hunger.  Stress, lack of sleep, too much sleep, or fatigue.  Weather changes.  What increases the risk? The following factors may make you more likely to experience migraine headaches:  Age. Risk increases with age.  Family history of migraine headaches.  Being Caucasian.  Depression and anxiety.  Obesity.  Being a woman.  Having a hole in the heart (patent foramen ovale) or other heart problems.  What are the signs or symptoms? The main symptom of this condition is pulsating or throbbing pain. Pain may:  Happen in any area of the head, such as on one side or both sides.  Interfere with daily activities.  Get worse with physical activity.  Get worse with exposure to bright lights or loud noises.  Other symptoms may include:  Nausea.  Vomiting.  Dizziness.  General sensitivity to bright lights, loud noises, or smells.  Before you get a migraine, you may get warning signs that a migraine is developing (aura). An aura may include:  Seeing flashing lights or having blind spots.  Seeing bright spots, halos, or zigzag lines.  Having tunnel vision or blurred vision.  Having numbness or a tingling feeling.  Having trouble talking.  Having muscle weakness.  How is this  diagnosed? A migraine headache can be diagnosed based on:  Your symptoms.  A physical exam.  Tests, such as CT scan or MRI of the head. These imaging tests can help rule out other causes of headaches.  Taking fluid from the spine (lumbar puncture) and analyzing it (cerebrospinal fluid analysis, or CSF analysis).  How is this treated? A migraine headache is usually treated with medicines that:  Relieve pain.  Relieve nausea.  Prevent migraines from coming back.  Treatment may also include:  Acupuncture.  Lifestyle changes like avoiding foods that trigger migraines.  Follow these instructions at home: Medicines  Take over-the-counter and prescription medicines only as told by your health care provider.  Do not drive or use heavy machinery while taking prescription pain medicine.  To prevent or treat constipation while you are taking prescription pain medicine, your health care provider may recommend that you: ? Drink enough fluid to keep your urine clear or pale yellow. ? Take over-the-counter or prescription medicines. ? Eat foods that are high in fiber, such as fresh fruits and vegetables, whole grains, and beans. ? Limit foods that are high in fat and processed sugars, such as fried and sweet foods. Lifestyle  Avoid alcohol use.  Do not use any products that contain nicotine or tobacco, such as cigarettes and e-cigarettes. If you need help quitting, ask your health care provider.  Get at least 8 hours of sleep every night.  Limit your stress. General instructions   Keep a journal to find out what may trigger your migraine headaches. For example, write down: ?  ? What you  eat and drink. ? How much sleep you get. ? Any change to your diet or medicines.  If you have a migraine: ? Avoid things that make your symptoms worse, such as bright lights. ? It may help to lie down in a dark, quiet room. ? Do not drive or use heavy machinery. ? Ask your health care  provider what activities are safe for you while you are experiencing symptoms.  Keep all follow-up visits as told by your health care provider. This is important. Contact a health care provider if:  You develop symptoms that are different or more severe than your usual migraine symptoms. Get help right away if:  Your migraine becomes severe.  You have a fever.  You have a stiff neck.  You have vision loss.  Your muscles feel weak or like you cannot control them.  You start to lose your balance often.  You develop trouble walking.  You faint. ++++++++++++++++++++++++++++++++++++++   Temporomandibular Joint Syndrome Temporomandibular joint (TMJ) syndrome is a condition that affects the joints between your jaw and your skull. The TMJs are located near your ears and allow your jaw to open and close. These joints and the nearby muscles are involved in all movements of the jaw. People with TMJ syndrome have pain in the area of these joints and muscles. Chewing, biting, or other movements of the jaw can be difficult or painful. TMJ syndrome can be caused by various things. In many cases, the condition is mild and goes away within a few weeks. For some people, the condition can become a long-term problem. What are the causes? Possible causes of TMJ syndrome include:  Grinding your teeth or clenching your jaw. Some people do this when they are under stress.  Arthritis.  Injury to the jaw.  Head or neck injury.  Teeth or dentures that are not aligned well.  In some cases, the cause of TMJ syndrome may not be known. What are the signs or symptoms? The most common symptom is an aching pain on the side of the head in the area of the TMJ. Other symptoms may include:  Pain when moving your jaw, such as when chewing or biting.  Being unable to open your jaw all the way.  Making a clicking sound when you open your mouth.  Headache.  Earache.  Neck or shoulder pain.  How is  this diagnosed? Diagnosis can usually be made based on your symptoms, your medical history, and a physical exam. Your health care provider may check the range of motion of your jaw. Imaging tests, such as X-rays or an MRI, are sometimes done. You may need to see your dentist to determine if your teeth and jaw are lined up correctly. How is this treated? TMJ syndrome often goes away on its own. If treatment is needed, the options may include:  Eating soft foods and applying ice or heat.  Medicines to relieve pain or inflammation.  Medicines to relax the muscles.  A splint, bite plate, or mouthpiece to prevent teeth grinding or jaw clenching.  Relaxation techniques or counseling to help reduce stress.  Transcutaneous electrical nerve stimulation (TENS). This helps to relieve pain by applying an electrical current through the skin.  Acupuncture. This is sometimes helpful to relieve pain.  Jaw surgery. This is rarely needed.  Follow these instructions at home:  Take medicines only as directed by your health care provider.  Eat a soft diet if you are having trouble chewing.  Apply ice  to the painful area. ? Put ice in a plastic bag. ? Place a towel between your skin and the bag. ? Leave the ice on for 20 minutes, 2-3 times a day.  Apply a warm compress to the painful area as directed.  Massage your jaw area and perform any jaw stretching exercises as recommended by your health care provider.  If you were given a mouthpiece or bite plate, wear it as directed.  Avoid foods that require a lot of chewing. Do not chew gum.  Keep all follow-up visits as directed by your health care provider. This is important. Contact a health care provider if:  You are having trouble eating.  You have new or worsening symptoms. Get help right away if:  Your jaw locks open or closed. This information is not intended to replace advice given to you by your health care provider. Make sure you  discuss any questions you have with your health care provider. Document Released: 05/14/2001 Document Revised: 04/18/2016 Document Reviewed: 03/24/2014 Elsevier Interactive Patient Education  [employer].

## 2018-01-17 ENCOUNTER — Encounter: Payer: Self-pay | Admitting: Internal Medicine

## 2018-01-17 NOTE — Progress Notes (Signed)
Subjective:    Patient ID: Marie Ingram, female    DOB: 06/10/1971, 47 y.o.   MRN: [id number]  HPI  Patient is a nice 47 yo MWF presenting with HA's that have prevented her from going to work for the last 4 days. Last day of work was 01/11/18.  Patient relates hx/o Migraine usu related to menses and  as generalized or bilat HA with photophobia, N/V, no aura, sometimes unilateral and periorbital.She has use Imitrex up to 2 x/ day with variable success and frequently w/rebound. She has not recognized specific triggers.   Medication Sig  . ALPRAZolam (XANAX) 1 MG Take 1 tabl 3  times daily as needed for anxiety.  . gabapentin  300 MG capsule 1-3 at night for sleep  . lisdexamfetamine ) 60 MG capsule Take 1 capsule (60 mg total) by mouth every morning.  . sertraline  100 MG  Take 1 tablet daily for Mood  . SUMAtriptan  100 MG TAKE 1 TAB  AS NEEDED , MAY REPEAT IN 2 HOURS   . traZODone ( 150 MG tablet TAKE 1 TABLET BY MOUTH AT BEDTIME.   Allergies  Allergen Reactions  . Sulfa Antibiotics Itching  . Amoxicillin Rash  . Penicillins Rash   Past Medical History:  Diagnosis Date  . Anxiety   . Depression   . Endometriosis   . Insomnia   . Kidney stones   . Migraines    Past Surgical History:  Procedure Laterality Date  . CESAREAN SECTION     X 2  . PELVIC LAPAROSCOPY  2004   Laser Endometriosis   Review of Systems  10 point systems review negative except as above.    Objective:   Physical Exam  BP 124/82   Pulse 88   Temp (!) 97.2 F (36.2 C)   Resp 16   Ht 5' 4.5" (1.638 m)   Wt 264 lb 6.4 oz (119.9 kg)   BMI 44.68 kg/m   Appears uncomfortable with squinted eyes. (+) tenderness in frontotemporal regions lessening with continued pressure to relief of HA.   HEENT - Eac's patent. TM's Nl, but moderate bilat TM jt tenderness. EOM's full. PERRLA. NasoOroPharynx clear. Neck - supple. Nl Thyroid. Carotids 2+ & No bruits, nodes, JVD Chest - Clear equal BS w/o rales, rhonchi,  wheezes. Cor - Nl HS. RRR w/o sig MGR. PP 1(+). No edema. MS- FROM w/o deformities. Muscle power, tone and bulk Nl. Gait Nl. Neuro - No obvious Cr N abnormalities. Sensory, motor and Cerebellar functions appear Nl w/o focal abnormalities. Psyche - Mental status normal & appears moderately uncomfortable.  No delusions, ideations or obvious mood abnormalities. Skin - exposed clear w/o rash cyanosis or icterus.    Assessment & Plan:   1. Mixed Vascular & Muscle Contraction  - predniSONE  20 MG tablet; 1 tab 3 x day for 3 days, then 1 tab 2 x day for 3 days, then 1 tab 1 x day for 5 days  Dispense: 20 tablet  2.  Periodic headache syndrome  - predniSONE 20 MG tablet; 1 tab 3 x day for 3 days, then 1 tab 2 x day for 3 days, then 1 tab 1 x day for 5 days  Dispense: 20 tablet  - For migraine prophylaxis - Rx: nadolol  20 MG tablet; Take 1 tablet daily for migraine prevention  Dispense: 90 tablet; Refill: 1  3. Bilateral temporomandibular joint pain  - predniSONE  20 MG tablet; 1 tab 3 x  day for 3 days, then 1 tab 2 x day for 3 days, then 1 tab 1 x day for 5 days  Dispense: 20 tablet; Refill: 0  4. Muscle contraction headache syndrome  - predniSONE  20 MG tablet; 1 tab 3 x day for 3 days, then 1 tab 2 x day for 3 days, then 1 tab 1 x day for 5 days  Dispense: 20 tablet; Refill: 0

## 2018-01-19 DIAGNOSIS — Z0000 Encounter for general adult medical examination without abnormal findings: Secondary | ICD-10-CM

## 2018-01-29 ENCOUNTER — Encounter: Payer: Self-pay | Admitting: Adult Health

## 2018-02-02 ENCOUNTER — Other Ambulatory Visit: Payer: Self-pay | Admitting: Physician Assistant

## 2018-02-02 DIAGNOSIS — Z0001 Encounter for general adult medical examination with abnormal findings: Secondary | ICD-10-CM

## 2018-02-02 DIAGNOSIS — F988 Other specified behavioral and emotional disorders with onset usually occurring in childhood and adolescence: Secondary | ICD-10-CM

## 2018-02-02 DIAGNOSIS — F3341 Major depressive disorder, recurrent, in partial remission: Secondary | ICD-10-CM

## 2018-02-02 MED ORDER — ALPRAZOLAM 1 MG PO TABS: 1.0000 mg | ORAL_TABLET | Freq: Three times a day (TID) | ORAL | 0 refills | Status: DC | PRN

## 2018-02-02 MED ORDER — LISDEXAMFETAMINE DIMESYLATE 60 MG PO CAPS: 60.0000 mg | ORAL_CAPSULE | ORAL | 0 refills | Status: DC

## 2018-02-02 MED FILL — ALPRAZolam 1 MG TABS: 1 | 25 days supply | Qty: 75 | Fill #0

## 2018-02-02 MED FILL — VYVANSE 60 MG CAPSULE: 60 | 30 days supply | Qty: 30 | Fill #0

## 2018-02-23 NOTE — Progress Notes (Signed)
Complete Physical  Assessment and Plan:  Encounter for general adult medical examination with abnormal findings  Morbid obesity (BMI 40.37) Long discussion about weight loss, diet, and exercise Recommended diet heavy in fruits and veggies and low in animal meats, cheeses, and dairy products, appropriate calorie intake Patient will work on diet via weight watchers Discussed appropriate weight for height  Follow up at next visit -     TSH  Other abnormal glucose Discussed general issues about diabetes pathophysiology and management., Educational material distributed., Suggested low cholesterol diet., Encouraged aerobic exercise., Discussed foot care., Reminded to get yearly retinal exam. -     TSH -     Hemoglobin A1c -     Urinalysis, Routine w reflex microscopic (not at [facility]) -     Microalbumin / creatinine urine ratio -     EKG 12-Lead  Other migraine without status migrainosus, not intractable  continue nadolol for now, ? Hx of good prophylaxis with topamax, will try this with nadolol and if doing well can try to taper off BB Continue imitrex Refer to neuro if not improving to discuss novel agents Follow up 3 months  Kidney stones Increase fluids  Anxiety Increase zoloft to 150 mg daily, then to 200 mg  Stress management techniques discussed, increase water, good sleep hygiene discussed, increase exercise, and increase veggies.  -     sertraline (ZOLOFT) 100 MG tablet; Take 2 tablet (100 mg total) by mouth daily.  Insomnia, unspecified type Continue medications  Recurrent major depressive disorder, in partial remission (HCC) -     lisdexamfetamine (VYVANSE) 60 MG capsule; Take 1 capsule (60 mg total) by mouth every morning. -     sertraline (ZOLOFT) 100 MG tablet; Take 2 tablet (100 mg total) by mouth daily.  Vitamin D deficiency -     VITAMIN D 25 Hydroxy (Vit-D Deficiency, Fractures)  Medication management -     CBC with Differential/Platelet -     CMP/GFR -      Magnesium  Attention deficit disorder, unspecified hyperactivity presence -     lisdexamfetamine (VYVANSE) 60 MG capsule; Take 1 capsule (60 mg total) by mouth every morning.  Hyperlipidemia, unspecified hyperlipidemia type -     Lipid panel -     EKG 12-Lead   Discussed med's effects and SE's. Screening labs and tests as requested with regular follow-up as recommended. Over 40 minutes of exam, counseling, chart review, and complex, high level critical decision making was performed this visit.   Future Appointments  Date Time Provider Department Center  03/01/2019  9:00 AM Judd Gaudierorbett, Myonna Chisom, NP [facility] None     HPI  47 y.o. female  presents for a complete physical and follow up for has Anxiety; Migraines; Kidney stones; Insomnia; Depression; Morbid obesity (BMI 40.37); Other abnormal glucose; Vitamin D deficiency; ADD (attention deficit disorder); and Cough on their problem list.   Patient is on an ADD medication, she states that the medication is helping and she denies any adverse reactions. She is doing very well on the vyvanse, very good focus without physical symptoms however she would have to take a second one around 1-2 pm.   She has anxiety/depression controlled with zoloft, xanax 1 at lunch, 1 at night and on trazodone at night, reports stress levels are very high at work, has a foreclosure situation at home, ex and legal concerns, etc. She does report she can sleep through the night.    She has dx of migraines, associated with stress  and menstrual cycle. Currently having 1-2 per month, significantly limits her and has to call out frequently r/t this. She is on nadolol 20 mg daily for prophylaxis, takes imitrex 100 mg as abortive. Can't tell if nadolol has helped.   BMI is Body mass index is 44.18 kg/m., she has been working on diet (has started [employer]), exercise is limited.  Wt Readings from Last 3 Encounters:  02/24/18 261 lb 6.4 oz (118.6 kg)  01/15/18 264 lb 6.4  oz (119.9 kg)  11/24/17 261 lb (118.4 kg)   Her blood pressure has been controlled at home, today their BP is BP: 112/72 She does not workout. She denies chest pain, shortness of breath, dizziness.   She is not on cholesterol medication and denies myalgias. Her cholesterol is not at goal. The cholesterol last visit was:   Lab Results  Component Value Date   CHOL 209 (H) 01/01/2017   HDL 48 (L) 01/01/2017   LDLCALC 125 (H) 01/01/2017   TRIG 179 (H) 01/01/2017   CHOLHDL 4.4 01/01/2017   She has been working on diet and exercise for prediabetes, and denies hypoglycemia , paresthesia of the feet, polydipsia, polyuria and visual disturbances. Last A1C in the office was:  Lab Results  Component Value Date   HGBA1C 5.2 06/05/2016   Patient is on Vitamin D supplement.   Lab Results  Component Value Date   VD25OH 36 06/05/2016       Current Medications:  Current Outpatient Medications on File Prior to Visit  Medication Sig Dispense Refill  . ALPRAZolam (XANAX) 1 MG tablet Take 1 tablet (1 mg total) by mouth 3 (three) times daily as needed for anxiety. 75 tablet 0  . gabapentin (NEURONTIN) 300 MG capsule 1-3 at night for sleep 90 capsule 2  . lisdexamfetamine (VYVANSE) 60 MG capsule Take 1 capsule (60 mg total) by mouth every morning. 30 capsule 0  . nadolol (CORGARD) 20 MG tablet Take 1 tablet daily for migraine prevention 90 tablet 1  . predniSONE (DELTASONE) 20 MG tablet 1 tab 3 x day for 3 days, then 1 tab 2 x day for 3 days, then 1 tab 1 x day for 5 days (Patient taking differently: AS NEEDED) 20 tablet 0  . sertraline (ZOLOFT) 100 MG tablet Take 1 tablet daily for Mood - will need Office Visit before further refills 30 tablet 1  . SUMAtriptan (IMITREX) 100 MG tablet TAKE 1 TABLET BY MOUTH AS NEEDED FOR MIGRAINE, MAY REPEAT IN 2 HOURS IF HEADACHE PERSISTS OR RECURS 10 tablet 2  . traZODone (DESYREL) 150 MG tablet TAKE 1 TABLET BY MOUTH AT BEDTIME. 90 tablet 1   No current  facility-administered medications on file prior to visit.    Allergies:  Allergies  Allergen Reactions  . Sulfa Antibiotics Itching  . Amoxicillin Rash  . Penicillins Rash   Medical History:  She has Anxiety; Migraines; Kidney stones; Insomnia; Depression; Morbid obesity (BMI 40.37); Other abnormal glucose; Vitamin D deficiency; ADD (attention deficit disorder); and Cough on their problem list.   Health Maintenance:   Immunization History  Administered Date(s) Administered  . Influenza,inj,Quad PF,6+ Mos 06/04/2016  . Influenza-Unspecified 05/29/2017  . Pneumococcal-Unspecified 09/02/1996  . Td 10/05/2002   Tetanus:2016 Pneumovax: 1998 Prevnar 13:  Flu vaccine: 2018  Zostavax:  No LMP recorded. Pap: 10/2015 Dr. Audie Box MGM: 07/2016 DUE will get at Dr. Audie Box DEXA: N/A Colonoscopy: N/A EGD: N/A CXR 2016  Patient Care Team: Quentin Mulling, PA-C as PCP - General (  Physician Assistant)  Surgical History:  She has a past surgical history that includes Cesarean section; Pelvic laparoscopy (2004); Tonsillectomy (2011); and Wisdom tooth extraction (1990). Family History:  Herfamily history includes ADD / ADHD in her daughter and son; Alcohol abuse in her father; Alzheimer's disease in her maternal aunt, maternal grandfather, and maternal grandmother; Anxiety disorder in her mother; Autism in her son; Celiac disease in her sister; Depression in her father and mother; Heart attack in her paternal grandmother; Hypertension in her mother; Migraines in her mother; Stroke in her paternal grandfather. Social History:  She reports that she quit smoking about 14 years ago. She has never used smokeless tobacco. She reports that she drinks about 8.4 oz of alcohol per week. She reports that she does not use drugs.  Review of Systems: Review of Systems  Constitutional: Negative.  Negative for malaise/fatigue and weight loss.  HENT: Negative.  Negative for hearing loss and tinnitus.    Eyes: Negative.  Negative for blurred vision and double vision.  Respiratory: Negative.  Negative for cough, sputum production, shortness of breath and wheezing.   Cardiovascular: Negative.  Negative for chest pain, palpitations, orthopnea, claudication, leg swelling and PND.  Gastrointestinal: Negative.  Negative for abdominal pain, blood in stool, constipation, diarrhea, heartburn, melena, nausea and vomiting.  Genitourinary: Negative.   Musculoskeletal: Negative.  Negative for falls, joint pain and myalgias.  Skin: Negative.  Negative for rash.  Neurological: Positive for headaches. Negative for dizziness, tingling, sensory change and weakness.  Endo/Heme/Allergies: Negative.  Negative for polydipsia.  Psychiatric/Behavioral: Positive for depression. Negative for memory loss, substance abuse and suicidal ideas. The patient is nervous/anxious and has insomnia.   All other systems reviewed and are negative.   Physical Exam: Estimated body mass index is 44.18 kg/m as calculated from the following:   Height as of this encounter: 5' 4.5" (1.638 m).   Weight as of this encounter: 261 lb 6.4 oz (118.6 kg). BP 112/72   Pulse 92   Temp 97.7 F (36.5 C)   Ht 5' 4.5" (1.638 m)   Wt 261 lb 6.4 oz (118.6 kg)   SpO2 98%   BMI 44.18 kg/m  General Appearance: Well nourished, in no apparent distress.  Eyes: PERRLA, EOMs, conjunctiva no swelling or erythema, normal fundi and vessels.  Sinuses: No Frontal/maxillary tenderness  ENT/Mouth: Ext aud canals clear, normal light reflex with TMs without erythema, bulging. Good dentition. No erythema, swelling, or exudate on post pharynx. Tonsils not swollen or erythematous. Hearing normal.  Neck: Supple, thyroid normal. No bruits  Respiratory: Respiratory effort normal, BS equal bilaterally without rales, rhonchi, wheezing or stridor.  Cardio: RRR without murmurs, rubs or gallops. Brisk peripheral pulses without edema.  Chest: symmetric, with normal  excursions and percussion.  Breasts: Defer to GYN Abdomen: Soft, nontender, no guarding, rebound, hernias, masses, or organomegaly.  Lymphatics: Non tender without lymphadenopathy.  Genitourinary: Defer to GYN Musculoskeletal: Full ROM all peripheral extremities,5/5 strength, and normal gait.  Skin: Warm, dry without rashes, lesions, ecchymosis. Neuro: Cranial nerves intact, reflexes equal bilaterally. Normal muscle tone, no cerebellar symptoms. Sensation intact.  Psych: Awake and oriented X 3, normal affect, Insight and Judgment appropriate.   EKG: WNL no ST changes. AORTA SCAN: defer  Dan Maker 9:23 AM [facility]

## 2018-02-24 ENCOUNTER — Encounter: Payer: Self-pay | Admitting: Adult Health

## 2018-02-24 ENCOUNTER — Ambulatory Visit (INDEPENDENT_AMBULATORY_CARE_PROVIDER_SITE_OTHER): Payer: 59 | Admitting: Adult Health

## 2018-02-24 VITALS — BP 112/72 | HR 92 | Temp 97.7°F | Ht 64.5 in | Wt 261.4 lb

## 2018-02-24 DIAGNOSIS — Z130 Encounter for screening for diseases of the blood and blood-forming organs and certain disorders involving the immune mechanism: Secondary | ICD-10-CM

## 2018-02-24 DIAGNOSIS — R05 Cough: Secondary | ICD-10-CM

## 2018-02-24 DIAGNOSIS — G4700 Insomnia, unspecified: Secondary | ICD-10-CM

## 2018-02-24 DIAGNOSIS — N200 Calculus of kidney: Secondary | ICD-10-CM

## 2018-02-24 DIAGNOSIS — F988 Other specified behavioral and emotional disorders with onset usually occurring in childhood and adolescence: Secondary | ICD-10-CM

## 2018-02-24 DIAGNOSIS — Z79899 Other long term (current) drug therapy: Secondary | ICD-10-CM

## 2018-02-24 DIAGNOSIS — F3341 Major depressive disorder, recurrent, in partial remission: Secondary | ICD-10-CM

## 2018-02-24 DIAGNOSIS — E559 Vitamin D deficiency, unspecified: Secondary | ICD-10-CM | POA: Diagnosis not present

## 2018-02-24 DIAGNOSIS — R7309 Other abnormal glucose: Secondary | ICD-10-CM | POA: Diagnosis not present

## 2018-02-24 DIAGNOSIS — E782 Mixed hyperlipidemia: Secondary | ICD-10-CM

## 2018-02-24 DIAGNOSIS — Z1211 Encounter for screening for malignant neoplasm of colon: Secondary | ICD-10-CM

## 2018-02-24 DIAGNOSIS — R059 Cough, unspecified: Secondary | ICD-10-CM

## 2018-02-24 DIAGNOSIS — Z136 Encounter for screening for cardiovascular disorders: Secondary | ICD-10-CM | POA: Diagnosis not present

## 2018-02-24 DIAGNOSIS — Z0001 Encounter for general adult medical examination with abnormal findings: Secondary | ICD-10-CM | POA: Diagnosis not present

## 2018-02-24 DIAGNOSIS — F419 Anxiety disorder, unspecified: Secondary | ICD-10-CM

## 2018-02-24 DIAGNOSIS — Z1389 Encounter for screening for other disorder: Secondary | ICD-10-CM

## 2018-02-24 DIAGNOSIS — G43819 Other migraine, intractable, without status migrainosus: Secondary | ICD-10-CM

## 2018-02-24 MED ORDER — TOPIRAMATE 50 MG PO TABS: ORAL_TABLET | ORAL | 1 refills | Status: DC

## 2018-02-24 MED ORDER — SERTRALINE HCL 100 MG PO TABS: ORAL_TABLET | ORAL | 1 refills | Status: DC

## 2018-02-24 MED FILL — TOPIRAMATE 50 MG TABLET: 50 | 30 days supply | Qty: 60 | Fill #0

## 2018-02-24 NOTE — Patient Instructions (Addendum)
Look into medical massage -   Aim for 7+ servings of fruits and vegetables daily  80+ fluid ounces of water or unsweet tea for healthy kidneys  1 drink of alcohol per day  Limit animal fats in diet for cholesterol and heart health - choose grass fed whenever available  Aim for low stress - take time to unwind and care for your mental health  Aim for 150 min of moderate intensity exercise weekly for heart health, and weights twice weekly for bone health  Aim for 7-9 hours of sleep daily       When it comes to diets, agreement about the perfect plan isn't easy to find, even among the experts. Experts at the [employer] developed an idea known as the Healthy Eating Plate. Just imagine a plate divided into logical, healthy portions.  The emphasis is on diet quality:  Load up on vegetables and fruits - one-half of your plate: Aim for color and variety, and remember that potatoes don't count.  Go for whole grains - one-quarter of your plate: Whole wheat, barley, wheat berries, quinoa, oats, brown rice, and foods made with them. If you want pasta, go with whole wheat pasta.  Protein power - one-quarter of your plate: Fish, chicken, beans, and nuts are all healthy, versatile protein sources. Limit red meat.  The diet, however, does go beyond the plate, offering a few other suggestions.  Use healthy plant oils, such as olive, canola, soy, corn, sunflower and peanut. Check the labels, and avoid partially hydrogenated oil, which have unhealthy trans fats.  If you're thirsty, drink water. Coffee and tea are good in moderation, but skip sugary drinks and limit milk and dairy products to one or two daily servings.  The type of carbohydrate in the diet is more important than the amount. Some sources of carbohydrates, such as vegetables, fruits, whole grains, and beans-are healthier than others.  Finally, stay active.    Topiramate tablets What is this  medicine? TOPIRAMATE (toe PYRE a mate) is used to treat seizures in adults or children with epilepsy. It is also used for the prevention of migraine headaches. This medicine may be used for other purposes; ask your health care provider or pharmacist if you have questions. COMMON BRAND NAME(S): Topamax, Topiragen What should I tell my health care provider before I take this medicine? They need to know if you have any of these conditions: -bleeding disorders -cirrhosis of the liver or liver disease -diarrhea -glaucoma -kidney stones or kidney disease -low blood counts, like low white cell, platelet, or red cell counts -lung disease like asthma, obstructive pulmonary disease, emphysema -metabolic acidosis -on a ketogenic diet -schedule for surgery or a procedure -suicidal thoughts, plans, or attempt; a previous suicide attempt by you or a family member -an unusual or allergic reaction to topiramate, other medicines, foods, dyes, or preservatives -pregnant or trying to get pregnant -breast-feeding How should I use this medicine? Take this medicine by mouth with a glass of water. Follow the directions on the prescription label. Do not crush or chew. You may take this medicine with meals. Take your medicine at regular intervals. Do not take it more often than directed. Talk to your pediatrician regarding the use of this medicine in children. Special care may be needed. While this drug may be prescribed for children as young as 182 years of age for selected conditions, precautions do apply. Overdosage: If you think you have taken too much of this medicine  contact a poison control center or emergency room at once. NOTE: This medicine is only for you. Do not share this medicine with others. What if I miss a dose? If you miss a dose, take it as soon as you can. If your next dose is to be taken in less than 6 hours, then do not take the missed dose. Take the next dose at your regular time. Do not take  double or extra doses. What may interact with this medicine? Do not take this medicine with any of the following medications: -probenecid This medicine may also interact with the following medications: -acetazolamide -alcohol -amitriptyline -aspirin and aspirin-like medicines -birth control pills -certain medicines for depression -certain medicines for seizures -certain medicines that treat or prevent blood clots like warfarin, enoxaparin, dalteparin, apixaban, dabigatran, and rivaroxaban -digoxin -hydrochlorothiazide -lithium -medicines for pain, sleep, or muscle relaxation -metformin -methazolamide -NSAIDS, medicines for pain and inflammation, like ibuprofen or naproxen -pioglitazone -risperidone This list may not describe all possible interactions. Give your health care provider a list of all the medicines, herbs, non-prescription drugs, or dietary supplements you use. Also tell them if you smoke, drink alcohol, or use illegal drugs. Some items may interact with your medicine. What should I watch for while using this medicine? Visit your doctor or health care professional for regular checks on your progress. Do not stop taking this medicine suddenly. This increases the risk of seizures if you are using this medicine to control epilepsy. Wear a medical identification bracelet or chain to say you have epilepsy or seizures, and carry a card that lists all your medicines. This medicine can decrease sweating and increase your body temperature. Watch for signs of deceased sweating or fever, especially in children. Avoid extreme heat, hot baths, and saunas. Be careful about exercising, especially in hot weather. Contact your health care provider right away if you notice a fever or decrease in sweating. You should drink plenty of fluids while taking this medicine. If you have had kidney stones in the past, this will help to reduce your chances of forming kidney stones. If you have stomach pain,  with nausea or vomiting and yellowing of your eyes or skin, call your doctor immediately. You may get drowsy, dizzy, or have blurred vision. Do not drive, use machinery, or do anything that needs mental alertness until you know how this medicine affects you. To reduce dizziness, do not sit or stand up quickly, especially if you are an older patient. Alcohol can increase drowsiness and dizziness. Avoid alcoholic drinks. If you notice blurred vision, eye pain, or other eye problems, seek medical attention at once for an eye exam. The use of this medicine may increase the chance of suicidal thoughts or actions. Pay special attention to how you are responding while on this medicine. Any worsening of mood, or thoughts of suicide or dying should be reported to your health care professional right away. This medicine may increase the chance of developing metabolic acidosis. If left untreated, this can cause kidney stones, bone disease, or slowed growth in children. Symptoms include breathing fast, fatigue, loss of appetite, irregular heartbeat, or loss of consciousness. Call your doctor immediately if you experience any of these side effects. Also, tell your doctor about any surgery you plan on having while taking this medicine since this may increase your risk for metabolic acidosis. Birth control pills may not work properly while you are taking this medicine. Talk to your doctor about using an extra method of birth control.  Women who become pregnant while using this medicine may enroll in the [place] Antiepileptic Drug Pregnancy Registry by calling [phone]. This registry collects information about the safety of antiepileptic drug use during pregnancy. What side effects may I notice from receiving this medicine? Side effects that you should report to your doctor or health care professional as soon as possible: -allergic reactions like skin rash, itching or hives, swelling of the face, lips, or  tongue -decreased sweating and/or rise in body temperature -depression -difficulty breathing, fast or irregular breathing patterns -difficulty speaking -difficulty walking or controlling muscle movements -hearing impairment -redness, blistering, peeling or loosening of the skin, including inside the mouth -tingling, pain or numbness in the hands or feet -unusual bleeding or bruising -unusually weak or tired -worsening of mood, thoughts or actions of suicide or dying Side effects that usually do not require medical attention (report to your doctor or health care professional if they continue or are bothersome): -altered taste -back pain, joint or muscle aches and pains -diarrhea, or constipation -headache -loss of appetite -nausea -stomach upset, indigestion -tremors This list may not describe all possible side effects. Call your doctor for medical advice about side effects. You may report side effects to FDA at 1-800-FDA-1088. Where should I keep my medicine? Keep out of the reach of children. Store at room temperature between 15 and 30 degrees C (59 and 86 degrees F) in a tightly closed container. Protect from moisture. Throw away any unused medicine after the expiration date. NOTE: This sheet is a summary. It may not cover all possible information. If you have questions about this medicine, talk to your doctor, pharmacist, or health care provider.  2018 Elsevier/Gold Standard (2013-08-23 23:17:57)

## 2018-02-26 LAB — CBC WITH DIFFERENTIAL/PLATELET
Basophils Absolute: 80 cells/uL (ref 0–200)
Basophils Relative: 0.9 %
Eosinophils Absolute: 249 cells/uL (ref 15–500)
Eosinophils Relative: 2.8 %
HEMATOCRIT: 40.5 % (ref 35.0–45.0)
HEMOGLOBIN: 12.9 g/dL (ref 11.7–15.5)
LYMPHS ABS: 2109 {cells}/uL (ref 850–3900)
MCH: 27.3 pg (ref 27.0–33.0)
MCHC: 31.9 g/dL — AB (ref 32.0–36.0)
MCV: 85.8 fL (ref 80.0–100.0)
MPV: 10.3 fL (ref 7.5–12.5)
Monocytes Relative: 6.2 %
NEUTROS PCT: 66.4 %
Neutro Abs: 5910 cells/uL (ref 1500–7800)
Platelets: 341 10*3/uL (ref 140–400)
RBC: 4.72 10*6/uL (ref 3.80–5.10)
RDW: 14.5 % (ref 11.0–15.0)
Total Lymphocyte: 23.7 %
WBC: 8.9 10*3/uL (ref 3.8–10.8)
WBCMIX: 552 {cells}/uL (ref 200–950)

## 2018-02-26 LAB — URINALYSIS W MICROSCOPIC + REFLEX CULTURE
Bacteria, UA: NONE SEEN /HPF
Bilirubin Urine: NEGATIVE
Glucose, UA: NEGATIVE
HYALINE CAST: NONE SEEN /LPF
Hgb urine dipstick: NEGATIVE
Ketones, ur: NEGATIVE
Nitrites, Initial: NEGATIVE
PH: 7 (ref 5.0–8.0)
Protein, ur: NEGATIVE
SPECIFIC GRAVITY, URINE: 1.014 (ref 1.001–1.03)
WBC, UA: NONE SEEN /HPF (ref 0–5)

## 2018-02-26 LAB — LIPID PANEL
CHOL/HDL RATIO: 5.3 (calc) — AB (ref <5.0)
Cholesterol: 210 mg/dL — ABNORMAL HIGH (ref <200)
HDL: 40 mg/dL — AB (ref >50)
LDL CHOLESTEROL (CALC): 139 mg/dL — AB
NON-HDL CHOLESTEROL (CALC): 170 mg/dL — AB (ref <130)
TRIGLYCERIDES: 174 mg/dL — AB (ref <150)

## 2018-02-26 LAB — MICROALBUMIN / CREATININE URINE RATIO
Creatinine, Urine: 109 mg/dL (ref 20–275)
Microalb Creat Ratio: 5 mcg/mg creat (ref <30)
Microalb, Ur: 0.5 mg/dL

## 2018-02-26 LAB — HEMOGLOBIN A1C
HEMOGLOBIN A1C: 5.1 %{Hb} (ref <5.7)
MEAN PLASMA GLUCOSE: 100 (calc)
eAG (mmol/L): 5.5 (calc)

## 2018-02-26 LAB — COMPLETE METABOLIC PANEL WITH GFR
AG RATIO: 1.6 (calc) (ref 1.0–2.5)
ALKALINE PHOSPHATASE (APISO): 81 U/L (ref 33–115)
ALT: 23 U/L (ref 6–29)
AST: 22 U/L (ref 10–35)
Albumin: 4.1 g/dL (ref 3.6–5.1)
BUN: 9 mg/dL (ref 7–25)
CALCIUM: 9.5 mg/dL (ref 8.6–10.2)
CO2: 28 mmol/L (ref 20–32)
Chloride: 104 mmol/L (ref 98–110)
Creat: 0.84 mg/dL (ref 0.50–1.10)
GFR, EST NON AFRICAN AMERICAN: 83 mL/min/{1.73_m2} (ref >=60)
GFR, Est African American: 97 mL/min/{1.73_m2} (ref >=60)
GLOBULIN: 2.5 g/dL (ref 1.9–3.7)
Glucose, Bld: 91 mg/dL (ref 65–99)
Potassium: 4.5 mmol/L (ref 3.5–5.3)
SODIUM: 139 mmol/L (ref 135–146)
Total Bilirubin: 0.4 mg/dL (ref 0.2–1.2)
Total Protein: 6.6 g/dL (ref 6.1–8.1)

## 2018-02-26 LAB — URINE CULTURE
MICRO NUMBER: 90762897
SPECIMEN QUALITY: ADEQUATE

## 2018-02-26 LAB — VITAMIN D 25 HYDROXY (VIT D DEFICIENCY, FRACTURES): VIT D 25 HYDROXY: 28 ng/mL — AB (ref 30–100)

## 2018-02-26 LAB — TSH: TSH: 1.31 mIU/L

## 2018-02-26 LAB — VITAMIN B12: VITAMIN B 12: 383 pg/mL (ref 200–1100)

## 2018-02-26 LAB — CULTURE INDICATED

## 2018-03-02 MED FILL — SERTRALINE HCL 100 MG TAB: 100 | 45 days supply | Qty: 90 | Fill #0

## 2018-03-02 MED FILL — SUMATRIPTAN SUCC 100 MG TAB: 100 | 30 days supply | Qty: 8 | Fill #2

## 2018-03-03 ENCOUNTER — Other Ambulatory Visit: Payer: Self-pay | Admitting: Adult Health

## 2018-03-03 ENCOUNTER — Encounter: Payer: Self-pay | Admitting: Adult Health

## 2018-03-03 DIAGNOSIS — F988 Other specified behavioral and emotional disorders with onset usually occurring in childhood and adolescence: Secondary | ICD-10-CM

## 2018-03-03 DIAGNOSIS — F3341 Major depressive disorder, recurrent, in partial remission: Secondary | ICD-10-CM

## 2018-03-03 DIAGNOSIS — Z0001 Encounter for general adult medical examination with abnormal findings: Secondary | ICD-10-CM

## 2018-03-04 ENCOUNTER — Other Ambulatory Visit: Payer: Self-pay | Admitting: Adult Health

## 2018-03-04 MED ORDER — ALPRAZOLAM 1 MG PO TABS: 1.0000 mg | ORAL_TABLET | Freq: Three times a day (TID) | ORAL | 0 refills | Status: DC | PRN

## 2018-03-04 MED ORDER — LISDEXAMFETAMINE DIMESYLATE 60 MG PO CAPS: 60.0000 mg | ORAL_CAPSULE | ORAL | 0 refills | Status: DC

## 2018-03-04 MED FILL — VYVANSE 60 MG CAPSULE: 60 | 30 days supply | Qty: 30 | Fill #0

## 2018-03-04 MED FILL — ALPRAZolam 1 MG TABS: 1 | 25 days supply | Qty: 75 | Fill #0

## 2018-03-16 ENCOUNTER — Encounter: Payer: Self-pay | Admitting: Adult Health

## 2018-03-16 ENCOUNTER — Other Ambulatory Visit: Payer: Self-pay | Admitting: Adult Health

## 2018-03-16 MED ORDER — PROMETHAZINE HCL 25 MG PO TABS: 25.0000 mg | ORAL_TABLET | Freq: Four times a day (QID) | ORAL | 0 refills | Status: DC | PRN

## 2018-03-16 NOTE — Progress Notes (Deleted)
Assessment and Plan:  1. Other migraine with status migrainosus, intractable ***     Further disposition pending results of labs. Discussed med's effects and SE's.   Over 30 minutes of exam, counseling, chart review, and critical decision making was performed.   Future Appointments  Date Time Provider Department Center  03/17/2018  3:45 PM Judd Gaudierorbett, Cybill Uriegas, NP [facility] None  03/01/2019  9:00 AM Judd Gaudierorbett, Alessandro Griep, NP [facility] None    ------------------------------------------------------------------------------------------------------------------   HPI There were no vitals taken for this visit.  47 y.o.female presents for migraine  Currently on nadolol 20 mg once daily, topamax 50 mg BID and prescribed imitrex 100 mg PRN  Past Medical History:  Diagnosis Date  . Anxiety   . Depression   . Endometriosis   . Insomnia   . Kidney stones   . Migraines      Allergies  Allergen Reactions  . Sulfa Antibiotics Itching  . Amoxicillin Rash  . Penicillins Rash    Current Outpatient Medications on File Prior to Visit  Medication Sig  . ALPRAZolam (XANAX) 1 MG tablet Take 1 tablet (1 mg total) by mouth 3 (three) times daily as needed for anxiety.  . gabapentin (NEURONTIN) 300 MG capsule 1-3 at night for sleep  . lisdexamfetamine (VYVANSE) 60 MG capsule Take 1 capsule (60 mg total) by mouth every morning.  . nadolol (CORGARD) 20 MG tablet Take 1 tablet daily for migraine prevention  . predniSONE (DELTASONE) 20 MG tablet 1 tab 3 x day for 3 days, then 1 tab 2 x day for 3 days, then 1 tab 1 x day for 5 days (Patient taking differently: AS NEEDED)  . promethazine (PHENERGAN) 25 MG tablet Take 1 tablet (25 mg total) by mouth every 6 (six) hours as needed for nausea or vomiting.  . sertraline (ZOLOFT) 100 MG tablet Take 2 tabs by mouth daily for mood.  . SUMAtriptan (IMITREX) 100 MG tablet TAKE 1 TABLET BY MOUTH AS NEEDED FOR MIGRAINE, MAY REPEAT IN 2 HOURS IF HEADACHE PERSISTS OR  RECURS  . topiramate (TOPAMAX) 50 MG tablet Start 1/2 tab at night x 1 week, then add in AM x 1 week, then increase to full tab in evening x 1 week then finally full tab twice daily  . traZODone (DESYREL) 150 MG tablet TAKE 1 TABLET BY MOUTH AT BEDTIME.   No current facility-administered medications on file prior to visit.     ROS: all negative except above.   Physical Exam:  There were no vitals taken for this visit.  General Appearance: Well nourished, in no apparent distress. Eyes: PERRLA, EOMs, conjunctiva no swelling or erythema Sinuses: No Frontal/maxillary tenderness ENT/Mouth: Ext aud canals clear, TMs without erythema, bulging. No erythema, swelling, or exudate on post pharynx.  Tonsils not swollen or erythematous. Hearing normal.  Neck: Supple, thyroid normal.  Respiratory: Respiratory effort normal, BS equal bilaterally without rales, rhonchi, wheezing or stridor.  Cardio: RRR with no MRGs. Brisk peripheral pulses without edema.  Abdomen: Soft, + BS.  Non tender, no guarding, rebound, hernias, masses. Lymphatics: Non tender without lymphadenopathy.  Musculoskeletal: Full ROM, 5/5 strength, normal gait.  Skin: Warm, dry without rashes, lesions, ecchymosis.  Neuro: Cranial nerves intact. Normal muscle tone, no cerebellar symptoms. Sensation intact.  Psych: Awake and oriented X 3, normal affect, Insight and Judgment appropriate.     Dan MakerAshley C Chayson Charters, NP 12:40 PM [facility]

## 2018-03-17 ENCOUNTER — Ambulatory Visit: Payer: Self-pay | Admitting: Adult Health

## 2018-03-18 ENCOUNTER — Encounter: Payer: Self-pay | Admitting: Adult Health

## 2018-03-18 ENCOUNTER — Ambulatory Visit: Payer: 59 | Admitting: Adult Health

## 2018-03-18 DIAGNOSIS — G43C1 Periodic headache syndromes in child or adult, intractable: Secondary | ICD-10-CM

## 2018-03-18 MED ORDER — KETOROLAC TROMETHAMINE 60 MG/2ML IM SOLN
60.0000 mg | Freq: Once | INTRAMUSCULAR | Status: AC
  Administered 2018-03-18: 60 mg via INTRAMUSCULAR

## 2018-03-18 MED ORDER — KETOROLAC TROMETHAMINE 30 MG/ML IJ SOLN: 60.0000 mg | Freq: Once | INTRAMUSCULAR | Status: DC

## 2018-03-18 MED ORDER — NADOLOL 80 MG PO TABS: ORAL_TABLET | ORAL | 1 refills | Status: DC

## 2018-03-18 NOTE — Progress Notes (Signed)
Assessment and Plan:  Chelly was seen today for migraine.  Diagnoses and all orders for this visit:  Intractable periodic headache syndrome No phenergan as she is driving herself, can take orally when gets home, toradol IM today which typically works well for her  Increase nadolol to 40 mg daily, then to 80 mg if tolerating in 2-3 weeks for prophylaxis Restart topamax as prescribed with slow taper Will refer to neuro, prefers Dr. Karel Jarvis if possible for severe debilitating migraines associated with  and frequent FMLA to discuss ongoing management, possibly good candidate for newer agents  -     Ambulatory referral to Neurology -     nadolol (CORGARD) 80 MG tablet; Take 1 tablet daily for migraine prevention -     ketorolac (TORADOL) 30 MG/ML injection 60 mg  Further disposition pending results of labs. Discussed med's effects and SE's.   Over 15 minutes of exam, counseling, chart review, and critical decision making was performed.   Future Appointments  Date Time Provider Department Center  03/18/2018 10:45 AM Judd Gaudier, NP [facility] None  03/01/2019  9:00 AM Judd Gaudier, NP [facility] None    ------------------------------------------------------------------------------------------------------------------   HPI BP 110/70   Pulse 72   Temp (!) 97.3 F (36.3 C)   Ht 5' 4.5" (1.638 m)   Wt 263 lb (119.3 kg)   SpO2 97%   BMI 44.45 kg/m   47 y.o.female presents for migraine, reports this began 6 days ago, typical migraine for her, reports she typically has once per month associated with menstrual cycle, but typically can resolve in 1-3 days. This episode ongoing for 6 days. Currently having left sided ice pick type pain behind left eye, having nausea and photosensitivity. She reports migraines alternate sides but has more commonly on left. She denies any aura. Pain is currently 10/10, improves some with imitrex and ibuprofen improves for a few hours but hasn't resolved. She  has had to call out since 03/12/2018.   Currently on nadolol 20 mg once daily, and prescribed imitrex 100 mg PRN as abortive. She is also prescribed prednisone PRN.   She was prescribed topamax 50 mg BID at last visit but did not taper up as prescribed/instructed and "felt bad" and hasn't been taking this.    Past Medical History:  Diagnosis Date  . Anxiety   . Depression   . Endometriosis   . Insomnia   . Kidney stones   . Migraines      Allergies  Allergen Reactions  . Sulfa Antibiotics Itching  . Amoxicillin Rash  . Penicillins Rash    Current Outpatient Medications on File Prior to Visit  Medication Sig  . ALPRAZolam (XANAX) 1 MG tablet Take 1 tablet (1 mg total) by mouth 3 (three) times daily as needed for anxiety.  . gabapentin (NEURONTIN) 300 MG capsule 1-3 at night for sleep  . lisdexamfetamine (VYVANSE) 60 MG capsule Take 1 capsule (60 mg total) by mouth every morning.  . nadolol (CORGARD) 20 MG tablet Take 1 tablet daily for migraine prevention  . predniSONE (DELTASONE) 20 MG tablet 1 tab 3 x day for 3 days, then 1 tab 2 x day for 3 days, then 1 tab 1 x day for 5 days (Patient taking differently: AS NEEDED)  . promethazine (PHENERGAN) 25 MG tablet Take 1 tablet (25 mg total) by mouth every 6 (six) hours as needed for nausea or vomiting.  . sertraline (ZOLOFT) 100 MG tablet Take 2 tabs by mouth daily for mood.  Marland Kitchen  SUMAtriptan (IMITREX) 100 MG tablet TAKE 1 TABLET BY MOUTH AS NEEDED FOR MIGRAINE, MAY REPEAT IN 2 HOURS IF HEADACHE PERSISTS OR RECURS  . topiramate (TOPAMAX) 50 MG tablet Start 1/2 tab at night x 1 week, then add in AM x 1 week, then increase to full tab in evening x 1 week then finally full tab twice daily  . traZODone (DESYREL) 150 MG tablet TAKE 1 TABLET BY MOUTH AT BEDTIME.   No current facility-administered medications on file prior to visit.     ROS: all negative except above.   Physical Exam:  BP 110/70   Pulse 72   Temp (!) 97.3 F (36.3 C)    Ht 5' 4.5" (1.638 m)   Wt 263 lb (119.3 kg)   SpO2 97%   BMI 44.45 kg/m   General Appearance: Well nourished, appears uncomfortable sitting in dark room Eyes: PERRLA (photosensitive) EOMs, conjunctiva no swelling or erythema Sinuses: No Frontal/maxillary tenderness ENT/Mouth: No erythema, swelling, or exudate on post pharynx.  Tonsils not swollen or erythematous. Hearing normal.  Neck: Supple.  Respiratory: Respiratory effort normal, BS equal bilaterally without rales, rhonchi, wheezing or stridor.  Cardio: RRR with no MRGs. Brisk peripheral pulses without edema.  Abdomen: Soft, + BS.   Lymphatics: Non tender without lymphadenopathy.  Musculoskeletal: Symmetrical strength, normal gait.  Skin: Warm, dry without rashes, lesions, ecchymosis.  Neuro: Cranial nerves intact. Normal muscle tone, no cerebellar symptoms. Sensation intact.  Psych: Awake and oriented X 3, normal affect, Insight and Judgment appropriate.     Dan MakerAshley C Marquet Faircloth, NP 10:43 AM [facility]

## 2018-03-18 NOTE — Patient Instructions (Addendum)
Increase nadolol to 40 mg (2 tabs daily), if your blood pressure is doing ok, no excessive fatigue or dizziness, go up to 80 mg daily in 2-3 weeks. Check BP at work.    Try to restart topamax slowly as originally prescribed, starting with 1/2 tab in evenings only    Recurrent Migraine Headache A migraine headache is very bad, throbbing pain that is usually on one side of your head. Recurrent migraines keep coming back (recurring). Talk with your doctor about what things may bring on (trigger) your migraine headaches. Follow these instructions at home: Medicines  Take over-the-counter and prescription medicines only as told by your doctor.  Do not drive or use heavy machinery while taking prescription pain medicine. Lifestyle  Do not use any products that contain nicotine or tobacco, such as cigarettes and e-cigarettes. If you need help quitting, ask your doctor.  Limit alcohol intake to no more than 1 drink a day for nonpregnant women and 2 drinks a day for men. One drink equals 12 oz of beer, 5 oz of wine, or 1 oz of hard liquor.  Get 7-9 hours of sleep each night.  Lessen any stress in your life. Ask your doctor about ways to lower your stress.  Stay at a healthy weight. Talk with your doctor if you need help losing weight.  Get regular exercise. General instructions  Keep a journal to find out if certain things bring on migraine headaches. For example, write down: ? What you eat and drink. ? How much sleep you get. ? Any change to your diet or medicines.  Lie down in a dark, quiet room when you have a migraine.  Try placing a cool towel over your head when you have a migraine.  Keep lights dim if bright lights bother you or make your migraines worse.  Keep all follow-up visits as told by your doctor. This is important. Contact a doctor if:  Medicine does not help your migraines.  Your pain keeps coming back.  You have a fever.  You have weight loss without  trying. Get help right away if:  Your migraine becomes really bad and medicine does not help.  You have a stiff neck.  You have trouble seeing.  Your muscles are weak or you lose control of your muscles.  You lose your balance or have trouble walking.  You feel like you will pass out (faint) or you pass out.  You have really bad symptoms that are different than your first symptoms.  You start having sudden, very bad headaches that last for one second or less, like a thunderclap. Summary  A migraine headache is very bad, throbbing pain that is usually on one side of your head.  Talk with your doctor about what things may bring on (trigger) your migraine headaches.  Take over-the-counter and prescription medicines only as told by your doctor.  Lie down in a dark, quiet room when you have a migraine.  Keep a journal about what you eat and drink, how much sleep you get, and any changes to your medicines. This can help you find out if certain things make you have migraine headaches. This information is not intended to replace advice given to you by your health care provider. Make sure you discuss any questions you have with your health care provider. Document Released: 05/28/2008 Document Revised: 07/12/2016 Document Reviewed: 07/12/2016 Elsevier Interactive Patient Education  2017 [employer]

## 2018-03-19 ENCOUNTER — Encounter: Payer: Self-pay | Admitting: Adult Health

## 2018-03-23 ENCOUNTER — Encounter: Payer: Self-pay | Admitting: Adult Health

## 2018-03-24 ENCOUNTER — Other Ambulatory Visit: Payer: Self-pay | Admitting: Adult Health

## 2018-03-24 MED FILL — traZODone HCL 150 MG TABS: 150 | 90 days supply | Qty: 90 | Fill #0

## 2018-03-24 MED FILL — GABAPENTIN 300 MG CAPSULE: 300 | 30 days supply | Qty: 90 | Fill #1

## 2018-03-25 ENCOUNTER — Encounter: Payer: Self-pay | Admitting: Adult Health

## 2018-03-27 ENCOUNTER — Encounter: Payer: Self-pay | Admitting: Adult Health

## 2018-04-03 ENCOUNTER — Other Ambulatory Visit: Payer: Self-pay | Admitting: Adult Health

## 2018-04-03 DIAGNOSIS — F988 Other specified behavioral and emotional disorders with onset usually occurring in childhood and adolescence: Secondary | ICD-10-CM

## 2018-04-03 DIAGNOSIS — F3341 Major depressive disorder, recurrent, in partial remission: Secondary | ICD-10-CM

## 2018-04-03 DIAGNOSIS — Z0001 Encounter for general adult medical examination with abnormal findings: Secondary | ICD-10-CM

## 2018-04-03 MED ORDER — LISDEXAMFETAMINE DIMESYLATE 60 MG PO CAPS: 60.0000 mg | ORAL_CAPSULE | ORAL | 0 refills | Status: DC

## 2018-04-03 MED ORDER — ALPRAZOLAM 1 MG PO TABS: 1.0000 mg | ORAL_TABLET | Freq: Three times a day (TID) | ORAL | 0 refills | Status: DC | PRN

## 2018-04-03 MED FILL — VYVANSE 60 MG CAPSULE: 60 | 30 days supply | Qty: 25 | Fill #0

## 2018-04-03 MED FILL — ALPRAZolam 1 MG TABS: 1 | 25 days supply | Qty: 75 | Fill #0

## 2018-05-07 ENCOUNTER — Other Ambulatory Visit: Payer: Self-pay

## 2018-05-07 DIAGNOSIS — M26623 Arthralgia of bilateral temporomandibular joint: Secondary | ICD-10-CM

## 2018-05-07 DIAGNOSIS — Z0001 Encounter for general adult medical examination with abnormal findings: Secondary | ICD-10-CM

## 2018-05-07 DIAGNOSIS — G43C1 Periodic headache syndromes in child or adult, intractable: Secondary | ICD-10-CM

## 2018-05-07 DIAGNOSIS — F988 Other specified behavioral and emotional disorders with onset usually occurring in childhood and adolescence: Secondary | ICD-10-CM

## 2018-05-07 DIAGNOSIS — F3341 Major depressive disorder, recurrent, in partial remission: Secondary | ICD-10-CM

## 2018-05-07 DIAGNOSIS — G44209 Tension-type headache, unspecified, not intractable: Secondary | ICD-10-CM

## 2018-05-07 DIAGNOSIS — G4489 Other headache syndrome: Secondary | ICD-10-CM

## 2018-05-07 MED ORDER — PREDNISONE 20 MG PO TABS: ORAL_TABLET | ORAL | 0 refills | Status: DC

## 2018-05-07 MED FILL — predniSONE 20 MG TABS: 20 | 11 days supply | Qty: 20 | Fill #0

## 2018-05-08 MED ORDER — ALPRAZOLAM 1 MG PO TABS: 1.0000 mg | ORAL_TABLET | Freq: Three times a day (TID) | ORAL | 0 refills | Status: DC | PRN

## 2018-05-08 MED ORDER — LISDEXAMFETAMINE DIMESYLATE 60 MG PO CAPS: 60.0000 mg | ORAL_CAPSULE | ORAL | 0 refills | Status: DC

## 2018-05-08 MED FILL — ALPRAZolam 1 MG TABS: 1 | 25 days supply | Qty: 75 | Fill #0

## 2018-05-08 MED FILL — VYVANSE 60 MG CAPSULE: 60 | 30 days supply | Qty: 25 | Fill #0

## 2018-05-11 ENCOUNTER — Telehealth: Payer: Self-pay | Admitting: Internal Medicine

## 2018-05-11 MED ORDER — BENZONATATE 200 MG PO CAPS: 200.0000 mg | ORAL_CAPSULE | Freq: Three times a day (TID) | ORAL | 5 refills | Status: DC | PRN

## 2018-05-11 MED FILL — BENZONATATE 200 MG CAPS: 200 | 13 days supply | Qty: 40 | Fill #0

## 2018-05-11 NOTE — Telephone Encounter (Signed)
ATC pt, no answer. Left message for pt to call back.  Spoke with Marie Ingram and she is requesting a refill on [employer]. She states she is on prednisone and it is making her cough, causing headaches. CY please advise.   Current Outpatient Medications on File Prior to Visit  Medication Sig Dispense Refill  . ALPRAZolam (XANAX) 1 MG tablet Take 1 tablet (1 mg total) by mouth 3 (three) times daily as needed for anxiety. Avoid taking daily to limit addiction. 75 tablet 0  . gabapentin (NEURONTIN) 300 MG capsule 1-3 at night for sleep 90 capsule 2  . lisdexamfetamine (VYVANSE) 60 MG capsule Take 1 capsule (60 mg total) by mouth every morning. Avoid taking daily. Don't take on weekends. 25 capsule 0  . nadolol (CORGARD) 80 MG tablet Take 1 tablet daily for migraine prevention 90 tablet 1  . predniSONE (DELTASONE) 20 MG tablet 1 tab 3 x day for 3 days, then 1 tab 2 x day for 3 days, then 1 tab 1 x day for 5 days 20 tablet 0  . promethazine (PHENERGAN) 25 MG tablet Take 1 tablet (25 mg total) by mouth every 6 (six) hours as needed for nausea or vomiting. 30 tablet 0  . sertraline (ZOLOFT) 100 MG tablet Take 2 tabs by mouth daily for mood. 90 tablet 1  . SUMAtriptan (IMITREX) 100 MG tablet TAKE 1 TABLET BY MOUTH AS NEEDED FOR MIGRAINE, MAY REPEAT IN 2 HOURS IF HEADACHE PERSISTS OR RECURS 10 tablet 2  . topiramate (TOPAMAX) 50 MG tablet Start 1/2 tab at night x 1 week, then add in AM x 1 week, then increase to full tab in evening x 1 week then finally full tab twice daily 60 tablet 1  . traZODone (DESYREL) 150 MG tablet TAKE 1 TABLET BY MOUTH AT BEDTIME. 90 tablet 1   No current facility-administered medications on file prior to visit.    Allergies  Allergen Reactions  . Sulfa Antibiotics Itching  . Amoxicillin Rash  . Penicillins Rash

## 2018-05-11 NOTE — Telephone Encounter (Signed)
Ok benzonatate perles 200 mg, # 40, 1 every 8 hours if needed for cough, ref x 5

## 2018-05-11 NOTE — Telephone Encounter (Signed)
Per Dr. Maple Hudson- Ok benzonatate perles 200 mg, # 40, 1 every 8 hours if needed for cough, ref x 5  Prescription ordered and sent to preferred pharmacy.  Patient notified. Nothing further at this time.

## 2018-05-25 ENCOUNTER — Encounter

## 2018-05-25 ENCOUNTER — Ambulatory Visit: Payer: 59 | Admitting: Neurology

## 2018-05-25 DIAGNOSIS — Z029 Encounter for administrative examinations, unspecified: Secondary | ICD-10-CM

## 2018-06-08 ENCOUNTER — Other Ambulatory Visit: Payer: Self-pay

## 2018-06-08 DIAGNOSIS — F988 Other specified behavioral and emotional disorders with onset usually occurring in childhood and adolescence: Secondary | ICD-10-CM

## 2018-06-08 DIAGNOSIS — F3341 Major depressive disorder, recurrent, in partial remission: Secondary | ICD-10-CM

## 2018-06-08 DIAGNOSIS — Z0001 Encounter for general adult medical examination with abnormal findings: Secondary | ICD-10-CM

## 2018-06-08 MED ORDER — PROMETHAZINE HCL 25 MG PO TABS: 25.0000 mg | ORAL_TABLET | Freq: Four times a day (QID) | ORAL | 0 refills | Status: DC | PRN

## 2018-06-08 MED FILL — PROMETHAZINE 25 MG TABLET: 25 | 7 days supply | Qty: 30 | Fill #0

## 2018-06-09 ENCOUNTER — Other Ambulatory Visit: Payer: Self-pay | Admitting: Physician Assistant

## 2018-06-09 MED ORDER — PROMETHAZINE HCL 25 MG PO TABS: 25.0000 mg | ORAL_TABLET | Freq: Four times a day (QID) | ORAL | 0 refills | Status: DC | PRN

## 2018-06-09 MED ORDER — ALPRAZOLAM 1 MG PO TABS: 1.0000 mg | ORAL_TABLET | Freq: Two times a day (BID) | ORAL | 0 refills | Status: DC | PRN

## 2018-06-09 MED ORDER — LISDEXAMFETAMINE DIMESYLATE 60 MG PO CAPS: 60.0000 mg | ORAL_CAPSULE | ORAL | 0 refills | Status: DC

## 2018-06-09 MED FILL — ALPRAZolam 1 MG TABS: 1 | 30 days supply | Qty: 60 | Fill #0

## 2018-06-09 MED FILL — VYVANSE 60 MG CAPSULE: 60 | 30 days supply | Qty: 25 | Fill #0

## 2018-06-16 ENCOUNTER — Encounter: Payer: Self-pay | Admitting: Physician Assistant

## 2018-06-25 ENCOUNTER — Encounter: Payer: Self-pay | Admitting: Adult Health

## 2018-06-26 ENCOUNTER — Other Ambulatory Visit: Payer: Self-pay

## 2018-06-26 MED ORDER — TRAZODONE HCL 150 MG PO TABS: 150.0000 mg | ORAL_TABLET | Freq: Every day | ORAL | 1 refills | Status: DC

## 2018-06-26 MED FILL — traZODone HCL 150 MG TABS: 150 | 90 days supply | Qty: 90 | Fill #1

## 2018-06-26 NOTE — Telephone Encounter (Signed)
Trazodone refill request 

## 2018-07-09 ENCOUNTER — Other Ambulatory Visit: Payer: Self-pay

## 2018-07-09 DIAGNOSIS — Z0001 Encounter for general adult medical examination with abnormal findings: Secondary | ICD-10-CM

## 2018-07-09 DIAGNOSIS — F3341 Major depressive disorder, recurrent, in partial remission: Secondary | ICD-10-CM

## 2018-07-09 DIAGNOSIS — F988 Other specified behavioral and emotional disorders with onset usually occurring in childhood and adolescence: Secondary | ICD-10-CM

## 2018-07-13 MED ORDER — ALPRAZOLAM 1 MG PO TABS: 1.0000 mg | ORAL_TABLET | Freq: Two times a day (BID) | ORAL | 0 refills | Status: DC | PRN

## 2018-07-13 MED ORDER — LISDEXAMFETAMINE DIMESYLATE 60 MG PO CAPS: 60.0000 mg | ORAL_CAPSULE | ORAL | 0 refills | Status: DC

## 2018-07-13 MED FILL — VYVANSE 60 MG CAPSULE: 60 | 30 days supply | Qty: 25 | Fill #0

## 2018-07-13 MED FILL — ALPRAZolam 1 MG TABS: 1 | 30 days supply | Qty: 60 | Fill #0

## 2018-07-28 ENCOUNTER — Encounter: Payer: Self-pay | Admitting: Neurology

## 2018-07-28 ENCOUNTER — Encounter

## 2018-07-28 ENCOUNTER — Other Ambulatory Visit: Payer: Self-pay

## 2018-07-28 ENCOUNTER — Ambulatory Visit: Payer: 59 | Admitting: Neurology

## 2018-07-28 VITALS — BP 132/80 | HR 106 | Ht 64.0 in | Wt 274.0 lb

## 2018-07-28 DIAGNOSIS — G43009 Migraine without aura, not intractable, without status migrainosus: Secondary | ICD-10-CM

## 2018-07-28 MED ORDER — NORTRIPTYLINE HCL 10 MG PO CAPS: ORAL_CAPSULE | ORAL | 5 refills | Status: DC

## 2018-07-28 MED ORDER — RIZATRIPTAN BENZOATE 10 MG PO TABS: ORAL_TABLET | ORAL | 11 refills | Status: DC

## 2018-07-28 MED FILL — NORTRIPTYLINE HCL 10 MG CAP: 10 | 33 days supply | Qty: 60 | Fill #0

## 2018-07-28 MED FILL — RIZATRIPTAN BENZOATE 10 MG: 10 | 22 days supply | Qty: 10 | Fill #0

## 2018-07-28 NOTE — Progress Notes (Signed)
NEUROLOGY CONSULTATION NOTE  BRITINY DEFRAIN MRN: [id number] DOB: [birth year]  Referring provider: Judd Gaudier, NP Primary care provider: Dr. Lucky Cowboy  Reason for consult:  Intractable periodic headache syndrome  Thank you for your kind referral of Marie Ingram for consultation of the above symptoms. Although her history is well known to you, please allow me to reiterate it for the purpose of our medical record. She is alone in the office today. Records and images were personally reviewed where available.  HISTORY OF PRESENT ILLNESS: This is a pleasant 47 year old right-handed woman with a history of anxiety, depression, insomnia, migraines, presenting for evaluation of increased headaches. Headaches started in her late 30s, usually over the left frontotemporal region, but sometimes on the right. She feels like someone took a hammer to her temple and eye socket. There is associated nausea/vomiting, photo/phonophobia. No vision changes, aura, focal numbness/tingling/weakness, dizziness. She has noticed they mostly occur around the time of her menstrual period, she has a migraine the week before her period starts and as soon as it ends. She has a regular menstrual cycle and tried OCPs for 2 weeks for migraines, which she feels caused weight gain and did not help. Headaches started worsening around August 2018, she has missed a lot of work due to this. She reports there was a lot of stress the past year, but that it ended last August 2019. She previously had a good response to Topamax, but when this was restarted last summer/srping, she had uncomfortable side effects on her sense of taste. She was tried on Nadolol which she continues to take but with continues migraines 3-4 times a month lasting for 1-2 days. Last migraine was last Sat/Sunday, she had one the weekend prior (started her period). She has prn Imitrex for rescue which helps but makes her body feel achy/horrible. She denies  any diplopia, tinnitus, dysarthria/dysphagia, neck/back pain, bowel/bladder dysfunction. Sleep is pretty good, she feels rested in the morning and has a hard time getting up. She previously took gabapentin for sleep which helped, but has not taken it for a few months and takes Trazodone now. Her mother and daughter have migraines.   PAST MEDICAL HISTORY: Past Medical History:  Diagnosis Date  . Anxiety   . Depression   . Endometriosis   . Insomnia   . Kidney stones   . Migraines     PAST SURGICAL HISTORY: Past Surgical History:  Procedure Laterality Date  . CESAREAN SECTION     X 2  . PELVIC LAPAROSCOPY  2004   Laser Endometriosis  . TONSILLECTOMY  2011  . WISDOM TOOTH EXTRACTION  1990    MEDICATIONS: Current Outpatient Medications on File Prior to Visit  Medication Sig Dispense Refill  . ALPRAZolam (XANAX) 1 MG tablet Take 1 tablet (1 mg total) by mouth 2 (two) times daily as needed for anxiety. Avoid taking daily to limit addiction. 60 tablet 0  . benzonatate (TESSALON) 200 MG capsule Take 1 capsule (200 mg total) by mouth every 8 (eight) hours as needed for cough. 40 capsule 5  . gabapentin (NEURONTIN) 300 MG capsule 1-3 at night for sleep 90 capsule 2  . lisdexamfetamine (VYVANSE) 60 MG capsule Take 1 capsule (60 mg total) by mouth every morning. Avoid taking daily. Don't take on weekends. 25 capsule 0  . predniSONE (DELTASONE) 20 MG tablet 1 tab 3 x day for 3 days, then 1 tab 2 x day for 3 days, then 1 tab 1 x  day for 5 days 20 tablet 0  . promethazine (PHENERGAN) 25 MG tablet Take 1 tablet (25 mg total) by mouth every 6 (six) hours as needed for nausea or vomiting. 30 tablet 0  . sertraline (ZOLOFT) 100 MG tablet Take 2 tabs by mouth daily for mood. 90 tablet 1  . SUMAtriptan (IMITREX) 100 MG tablet TAKE 1 TABLET BY MOUTH AS NEEDED FOR MIGRAINE, MAY REPEAT IN 2 HOURS IF HEADACHE PERSISTS OR RECURS 10 tablet 2  . traZODone (DESYREL) 150 MG tablet Take 1 tablet (150 mg total)  by mouth at bedtime. 90 tablet 1  . topiramate (TOPAMAX) 50 MG tablet Start 1/2 tab at night x 1 week, then add in AM x 1 week, then increase to full tab in evening x 1 week then finally full tab twice daily (Patient not taking: Reported on 07/28/2018) 60 tablet 1   No current facility-administered medications on file prior to visit.     ALLERGIES: Allergies  Allergen Reactions  . Sulfa Antibiotics Itching  . Amoxicillin Rash  . Penicillins Rash    FAMILY HISTORY: Family History  Problem Relation Age of Onset  . Migraines Mother   . Hypertension Mother   . Anxiety disorder Mother   . Depression Mother   . Depression Father   . Alcohol abuse Father   . Celiac disease Sister   . ADD / ADHD Daughter   . Autism Son   . ADD / ADHD Son   . Alzheimer's disease Maternal Grandmother   . Alzheimer's disease Maternal Grandfather   . Heart attack Paternal Grandmother   . Stroke Paternal Grandfather   . Alzheimer's disease Maternal Aunt        x3    SOCIAL HISTORY: Social History   Socioeconomic History  . Marital status: Significant Other    Spouse name: Not on file  . Number of children: 2  . Years of education: Not on file  . Highest education level: Not on file  Occupational History  . Not on file  Social Needs  . Financial resource strain: Not on file  . Food insecurity:    Worry: Not on file    Inability: Not on file  . Transportation needs:    Medical: Not on file    Non-medical: Not on file  Tobacco Use  . Smoking status: Former Smoker    Last attempt to quit: 05/19/2003    Years since quitting: 15.2  . Smokeless tobacco: Never Used  Substance and Sexual Activity  . Alcohol use: Yes    Alcohol/week: 14.0 standard drinks    Types: 14 Glasses of wine per week  . Drug use: No  . Sexual activity: Yes    Partners: Male    Birth control/protection: None    Comment: 1st intercourse 47 yo-More than 5 partners  Lifestyle  . Physical activity:    Days per week: 0  days    Minutes per session: 0 min  . Stress: Rather much  Relationships  . Social connections:    Talks on phone: Not on file    Gets together: Not on file    Attends religious service: Not on file    Active member of club or organization: Not on file    Attends meetings of clubs or organizations: Not on file    Relationship status: Not on file  . Intimate partner violence:    Fear of current or ex partner: Not on file    Emotionally abused: Not  on file    Physically abused: Not on file    Forced sexual activity: Not on file  Other Topics Concern  . Not on file  Social History Narrative   Pt lives in single story home with her significant other and her son   Has 2 children (adult daughter no longer lives in the home)   High school graduate   Works as Secretary/administratorront desk rep for [employer]     REVIEW OF SYSTEMS: Constitutional: No fevers, chills, or sweats, no generalized fatigue, change in appetite Eyes: No visual changes, double vision, eye pain Ear, nose and throat: No hearing loss, ear pain, nasal congestion, sore throat Cardiovascular: No chest pain, palpitations Respiratory:  No shortness of breath at rest or with exertion, wheezes GastrointestinaI: No nausea, vomiting, diarrhea, abdominal pain, fecal incontinence Genitourinary:  No dysuria, urinary retention or frequency Musculoskeletal:  No neck pain, back pain Integumentary: No rash, pruritus, skin lesions Neurological: as above Psychiatric: No depression, insomnia, anxiety Endocrine: No palpitations, fatigue, diaphoresis, mood swings, change in appetite, change in weight, increased thirst Hematologic/Lymphatic:  No anemia, purpura, petechiae. Allergic/Immunologic: no itchy/runny eyes, nasal congestion, recent allergic reactions, rashes  PHYSICAL EXAM: Vitals:   07/28/18 0859  BP: 132/80  Pulse: (!) 106  SpO2: 96%   General: No acute distress Head:  Normocephalic/atraumatic Eyes: Fundoscopic exam shows  bilateral sharp discs, no vessel changes, exudates, or hemorrhages Neck: supple, no paraspinal tenderness, full range of motion Back: No paraspinal tenderness Heart: regular rate and rhythm Lungs: Clear to auscultation bilaterally. Vascular: No carotid bruits. Skin/Extremities: No rash, no edema Neurological Exam: Mental status: alert and oriented to person, place, and time, no dysarthria or aphasia, Fund of knowledge is appropriate.  Recent and remote memory are intact. 3/3 delayed recall.  Attention and concentration are normal.    Able to name objects and repeat phrases. Cranial nerves: CN I: not tested CN II: pupils equal, round and reactive to light, visual fields intact, fundi unremarkable. CN III, IV, VI:  full range of motion, no nystagmus, no ptosis CN V: facial sensation intact CN VII: upper and lower face symmetric CN VIII: hearing intact to finger rub CN IX, X: gag intact, uvula midline CN XI: sternocleidomastoid and trapezius muscles intact CN XII: tongue midline Bulk & Tone: normal, no fasciculations. Motor: 5/5 throughout with no pronator drift. Sensation: intact to light touch, cold, pin, vibration and joint position sense.  No extinction to double simultaneous stimulation.  Romberg test negative Deep Tendon Reflexes: brisk +2 throughout, no ankle clonus Plantar responses: downgoing bilaterally Cerebellar: no incoordination on finger to nose, heel to shin. No dysdiadochokinesia Gait: narrow-based and steady, able to tandem walk adequately. Tremor: none  IMPRESSION: This is a pleasant 47 year old right-handed woman with a history of anxiety, depression, insomnia, presenting for evaluation of headaches suggestive of migraine without aura, with catamenial component. OCPs did not help. She has tried Topamax and Nadolol, and was on gabapentin for insomnia. We discussed starting a different migraine preventative medication, nortriptyline, start 10mg  qhs x 1 week, then increase  to 20mg  qhs. Side effects discussed, we may uptitrate dose if tolerated. She was advised to hold off on Trazodone when starting nortriptyline as this can potentially help with sleep as well. She may benefit from a CGRP inhibitor in the future, if nortriptyline is ineffective or not tolerated. She will try a different rescue medication, Maxalt, side effects discussed, she knows to minimize rescue medication to 2-3 times a week to  avoid rebound headaches. She was advised to keep a calendar of her migraines and follow-up in 3 months.   Thank you for allowing me to participate in the care of this patient. Please do not hesitate to call for any questions or concerns.   Patrcia Dolly, M.D.  CC: Dr. Oneta Rack, Judd Gaudier, NP

## 2018-07-28 NOTE — Patient Instructions (Signed)
1. Start nortriptyline 10mg : Take 1 capsule every night for 1 week, then increase to 2 capsules every night. After a month, we may increase dose further, update our office  2. Try Maxalt (rizatriptan) 10mg : take 1 tablet as needed for migraine. Do not take more than 2-3 a week  3. Keep a journal of your migraines  4. Follow-up in 3 months, call for any changes

## 2018-08-13 ENCOUNTER — Other Ambulatory Visit: Payer: Self-pay | Admitting: Physician Assistant

## 2018-08-13 DIAGNOSIS — Z0001 Encounter for general adult medical examination with abnormal findings: Secondary | ICD-10-CM

## 2018-08-13 DIAGNOSIS — F3341 Major depressive disorder, recurrent, in partial remission: Secondary | ICD-10-CM

## 2018-08-13 DIAGNOSIS — F988 Other specified behavioral and emotional disorders with onset usually occurring in childhood and adolescence: Secondary | ICD-10-CM

## 2018-08-13 MED FILL — RIZATRIPTAN BENZOATE 10 MG: 10 | 22 days supply | Qty: 10 | Fill #0

## 2018-08-13 MED FILL — VYVANSE 60 MG CAPSULE: 60 | 30 days supply | Qty: 25 | Fill #0

## 2018-08-13 MED FILL — NORTRIPTYLINE HCL 10 MG CAP: 10 | 30 days supply | Qty: 53 | Fill #0

## 2018-08-13 MED FILL — SERTRALINE HCL 100 MG TAB: 100 | 45 days supply | Qty: 90 | Fill #1

## 2018-08-14 ENCOUNTER — Telehealth: Payer: Self-pay | Admitting: Physician Assistant

## 2018-08-14 MED ORDER — ALPRAZOLAM 1 MG PO TABS: 1.0000 mg | ORAL_TABLET | Freq: Two times a day (BID) | ORAL | 0 refills | Status: DC | PRN

## 2018-08-14 NOTE — Telephone Encounter (Signed)
-----   Message from Gregery NaAngela D Duff, CMA sent at 08/13/2018  2:21 PM EST ----- Regarding: med refill Would like a refill on XANAX 1 mg TAB  Pharmacy-[facility]

## 2018-08-19 MED FILL — ALPRAZolam 1 MG TABS: 1 | 25 days supply | Qty: 50 | Fill #0

## 2018-08-22 ENCOUNTER — Telehealth: Payer: Self-pay | Admitting: Physician Assistant

## 2018-08-22 NOTE — Telephone Encounter (Signed)
PDMP was reviewed, patient picked up xanax 12/18. She should not need a refill. Need to cut back use. May need to stop prescribing if she is requesting more.

## 2018-08-22 NOTE — Telephone Encounter (Signed)
-----   Message from Gregery NaAngela D Duff, CMA sent at 08/21/2018  8:23 AM EST ----- Regarding: REFILL Contact: [phone] Per pt/Yellow note:   Refill on XANAX Please & Thank You!  Pharmacy: [facility]

## 2018-08-24 NOTE — Telephone Encounter (Signed)
LVM to return office call.

## 2018-08-24 NOTE — Telephone Encounter (Signed)
Message left for patient to return my call.I will continue to try later.  

## 2018-08-27 ENCOUNTER — Telehealth: Payer: Self-pay

## 2018-08-27 NOTE — Telephone Encounter (Signed)
-----   Message from Quentin MullingAmanda Collier, [place]C sent at 08/22/2018  8:18 AM EST ----- Regarding: RE: REFILL Contact: [phone] It was picked up 12/18.  Marchelle FolksAmanda ----- Message ----- From: Gregery Nauff, Markie Heffernan D, CMA Sent: 08/21/2018   8:23 AM EST To: Quentin MullingAmanda Collier, PA-C Subject: REFILL                                         Per pt/Yellow note:   Refill on XANAX Please & Thank You!  Pharmacy: [facility]

## 2018-08-27 NOTE — Telephone Encounter (Signed)
LVM asking pt to return call in order to inform pt that Rx was sent to pharmacy on Dec 18th 2019

## 2018-08-27 NOTE — Telephone Encounter (Signed)
Pt has not returned phone call at this time.Dec 26th 2019 at 12:09pm By DD

## 2018-09-09 DIAGNOSIS — E785 Hyperlipidemia, unspecified: Secondary | ICD-10-CM | POA: Insufficient documentation

## 2018-09-09 NOTE — Progress Notes (Deleted)
FOLLOW UP  Assessment and Plan:   Migraines *** Newly managed by Dr. Karel Jarvis  Cholesterol Currently above goal; discussed initiate statin if LDL remains 130+ today Continue low cholesterol diet and exercise.  Check lipid panel.   Other abnormal glucose Recent A1Cs at goal Discussed diet/exercise, weight management  Defer A1C; check CMP  Obesity with co morbidities Long discussion about weight loss, diet, and exercise Recommended diet heavy in fruits and veggies and low in animal meats, cheeses, and dairy products, appropriate calorie intake Discussed ideal weight for height (below ***) and initial weight goal (***) Patient will work on *** Will follow up in 3 months  Vitamin D Def below goal at last visit; she has *** changed dose continue supplementation to maintain goal of 70-100 Check Vit D level  Depression/anxiety Continue medications  Lifestyle discussed: diet/exerise, sleep hygiene, stress management, hydration  ADD Continue medications: vyvanse 60 mg daily PRN  Helps with focus, no AE's. The patient was counseled on the addictive nature of the medication and was encouraged to take drug holidays when not needed.    Continue diet and meds as discussed. Further disposition pending results of labs. Discussed med's effects and SE's.   Over 30 minutes of exam, counseling, chart review, and critical decision making was performed.   Future Appointments  Date Time Provider Department Center  09/10/2018  8:45 AM Judd Gaudier, NP [facility] None  10/30/2018  2:00 PM Van Clines, MD [facility] None  03/01/2019  9:00 AM Judd Gaudier, NP [facility] None    ----------------------------------------------------------------------------------------------------------------------  HPI 48 y.o. female  presents for 6 month follow up on migraines, cholesterol, depression/anxiety, obesity and vitamin D deficiency.  She was referred neurology for management of migraines due  to frequent debilitating headaches leading to significant sick days. She was seen by Dr. Karel Jarvis and newly on nortriptyline, *** start 10mg  qhs x 1 week, then increase to 20mg  qhs. She is trying maxalt as rescue agent   she has a diagnosis of depression/anxiety and is currently on zoloft 200 mg daily, xanax 0.5-1 mg PRN ***, reports symptoms are*** well controlled on current regimen. she   Patient is on an ADD medication, taking vyvanse 60 mg *** she states that the medication is helping and she denies any adverse reactions.     BMI is There is no height or weight on file to calculate BMI., she {HAS HAS OEV:03500} been working on diet and exercise. Wt Readings from Last 3 Encounters:  07/28/18 274 lb (124.3 kg)  03/18/18 263 lb (119.3 kg)  02/24/18 261 lb 6.4 oz (118.6 kg)   Her blood pressure {HAS HAS NOT:18834} been controlled at home, today their BP is    She {DOES_DOES XFG:18299} workout. She denies chest pain, shortness of breath, dizziness.   She is not on cholesterol medication. Her cholesterol is not at goal. The cholesterol last visit was:   Lab Results  Component Value Date   CHOL 210 (H) 02/24/2018   HDL 40 (L) 02/24/2018   LDLCALC 139 (H) 02/24/2018   TRIG 174 (H) 02/24/2018   CHOLHDL 5.3 (H) 02/24/2018    She {Has/has not:18111} been working on diet and exercise for glucose mangement, and denies {Symptoms; diabetes w/o none:19199}. Last A1C in the office was:  Lab Results  Component Value Date   HGBA1C 5.1 02/24/2018   Patient is on Vitamin D supplement.   Lab Results  Component Value Date   VD25OH 28 (L) 02/24/2018  Current Medications:  Current Outpatient Medications on File Prior to Visit  Medication Sig  . ALPRAZolam (XANAX) 1 MG tablet Take 1 tablet (1 mg total) by mouth 2 (two) times daily as needed for anxiety. Avoid taking daily to limit addiction.  . benzonatate (TESSALON) 200 MG capsule Take 1 capsule (200 mg total) by mouth every 8 (eight) hours  as needed for cough.  . nortriptyline (PAMELOR) 10 MG capsule Take 1 capsule every night for 1 week, then increase to 2 capsules every night  . promethazine (PHENERGAN) 25 MG tablet Take 1 tablet (25 mg total) by mouth every 6 (six) hours as needed for nausea or vomiting.  . rizatriptan (MAXALT) 10 MG tablet Take 1 tablet as needed for migraine. Do not take more than 2-3 a week  . sertraline (ZOLOFT) 100 MG tablet Take 2 tabs by mouth daily for mood.  . SUMAtriptan (IMITREX) 100 MG tablet TAKE 1 TABLET BY MOUTH AS NEEDED FOR MIGRAINE, MAY REPEAT IN 2 HOURS IF HEADACHE PERSISTS OR RECURS  . traZODone (DESYREL) 150 MG tablet Take 1 tablet (150 mg total) by mouth at bedtime.  Marland Kitchen. VYVANSE 60 MG capsule TAKE 1 CAPSULE BY MOUTH EVERY MORNING. AVOID TAKING DAILY. DON'T TAKE ON WEEKENDS.   No current facility-administered medications on file prior to visit.      Allergies:  Allergies  Allergen Reactions  . Sulfa Antibiotics Itching  . Amoxicillin Rash  . Penicillins Rash     Medical History:  Past Medical History:  Diagnosis Date  . Anxiety   . Depression   . Endometriosis   . Insomnia   . Kidney stones   . Migraines    Family history- Reviewed and unchanged Social history- Reviewed and unchanged   Review of Systems:  Review of Systems  Constitutional: Negative for malaise/fatigue and weight loss.  HENT: Negative for hearing loss and tinnitus.   Eyes: Negative for blurred vision and double vision.  Respiratory: Negative for cough, shortness of breath and wheezing.   Cardiovascular: Negative for chest pain, palpitations, orthopnea, claudication and leg swelling.  Gastrointestinal: Negative for abdominal pain, blood in stool, constipation, diarrhea, heartburn, melena, nausea and vomiting.  Genitourinary: Negative.   Musculoskeletal: Negative for joint pain and myalgias.  Skin: Negative for rash.  Neurological: Negative for dizziness, tingling, sensory change, weakness and headaches.   Endo/Heme/Allergies: Negative for polydipsia.  Psychiatric/Behavioral: Negative.   All other systems reviewed and are negative.     Physical Exam: There were no vitals taken for this visit. Wt Readings from Last 3 Encounters:  07/28/18 274 lb (124.3 kg)  03/18/18 263 lb (119.3 kg)  02/24/18 261 lb 6.4 oz (118.6 kg)   General Appearance: Well nourished, in no apparent distress. Eyes: PERRLA, EOMs, conjunctiva no swelling or erythema Sinuses: No Frontal/maxillary tenderness ENT/Mouth: Ext aud canals clear, TMs without erythema, bulging. No erythema, swelling, or exudate on post pharynx.  Tonsils not swollen or erythematous. Hearing normal.  Neck: Supple, thyroid normal.  Respiratory: Respiratory effort normal, BS equal bilaterally without rales, rhonchi, wheezing or stridor.  Cardio: RRR with no MRGs. Brisk peripheral pulses without edema.  Abdomen: Soft, + BS.  Non tender, no guarding, rebound, hernias, masses. Lymphatics: Non tender without lymphadenopathy.  Musculoskeletal: Full ROM, 5/5 strength, {PSY - GAIT AND STATION:22860} gait Skin: Warm, dry without rashes, lesions, ecchymosis.  Neuro: Cranial nerves intact. No cerebellar symptoms.  Psych: Awake and oriented X 3, normal affect, Insight and Judgment appropriate.    Dan MakerAshley C Shadae Reino,  NP 8:22 AM [facility]

## 2018-09-10 ENCOUNTER — Ambulatory Visit: Payer: Self-pay | Admitting: Adult Health

## 2018-09-17 MED FILL — RIZATRIPTAN BENZOATE 10 MG: 10 | 22 days supply | Qty: 10 | Fill #1

## 2018-09-21 ENCOUNTER — Other Ambulatory Visit: Payer: Self-pay | Admitting: Physician Assistant

## 2018-09-21 ENCOUNTER — Other Ambulatory Visit: Payer: Self-pay | Admitting: Adult Health

## 2018-09-21 DIAGNOSIS — F3341 Major depressive disorder, recurrent, in partial remission: Secondary | ICD-10-CM

## 2018-09-21 DIAGNOSIS — Z0001 Encounter for general adult medical examination with abnormal findings: Secondary | ICD-10-CM

## 2018-09-21 DIAGNOSIS — F988 Other specified behavioral and emotional disorders with onset usually occurring in childhood and adolescence: Secondary | ICD-10-CM

## 2018-09-21 MED FILL — traZODone HCL 150 MG TABS: 150 | 90 days supply | Qty: 90 | Fill #0

## 2018-09-21 MED FILL — VYVANSE 60 MG CAPSULE: 60 | 30 days supply | Qty: 25 | Fill #0

## 2018-09-21 MED FILL — ALPRAZolam 1 MG TABS: 1 | 25 days supply | Qty: 50 | Fill #0

## 2018-10-05 ENCOUNTER — Encounter: Payer: Self-pay | Admitting: Physician Assistant

## 2018-10-21 ENCOUNTER — Encounter: Payer: Self-pay | Admitting: Adult Health

## 2018-10-21 ENCOUNTER — Ambulatory Visit: Payer: 59 | Admitting: Adult Health

## 2018-10-21 VITALS — BP 118/76 | HR 91 | Temp 97.3°F | Resp 16 | Ht 64.0 in | Wt 283.6 lb

## 2018-10-21 DIAGNOSIS — J209 Acute bronchitis, unspecified: Secondary | ICD-10-CM

## 2018-10-21 DIAGNOSIS — B9789 Other viral agents as the cause of diseases classified elsewhere: Secondary | ICD-10-CM

## 2018-10-21 DIAGNOSIS — J988 Other specified respiratory disorders: Secondary | ICD-10-CM

## 2018-10-21 MED ORDER — PROMETHAZINE-DM 6.25-15 MG/5ML PO SYRP: 5.0000 mL | ORAL_SOLUTION | Freq: Four times a day (QID) | ORAL | 1 refills | Status: DC | PRN

## 2018-10-21 MED ORDER — PREDNISONE 20 MG PO TABS: ORAL_TABLET | ORAL | 0 refills | Status: DC

## 2018-10-21 MED ORDER — HYDROCODONE-HOMATROPINE 5-1.5 MG/5ML PO SYRP: ORAL_SOLUTION | ORAL | 0 refills | Status: DC

## 2018-10-21 MED ORDER — AZITHROMYCIN 250 MG PO TABS: ORAL_TABLET | ORAL | 1 refills | Status: AC

## 2018-10-21 MED FILL — HYDROCODONE-HOMATROPINE SYR: 5-1.5 | 24 days supply | Qty: 120 | Fill #0

## 2018-10-21 MED FILL — predniSONE 20 MG TABS: 20 | 7 days supply | Qty: 10 | Fill #0

## 2018-10-21 MED FILL — PROMETHAZINE W/DM SYRUP: 6.25-15 | 12 days supply | Qty: 240 | Fill #0

## 2018-10-21 NOTE — Progress Notes (Signed)
Assessment and Plan:  Marie Ingram was seen today for acute visit.  Diagnoses and all orders for this visit:  Acute bronchitis, unspecified organism/ likely Viral respiratory illness Bronchitic/reactive cough -  Discussed the importance of avoiding unnecessary antibiotic therapy, viral uri self limited and typically resolves with supportive treatment after 5-7 days Cautioned superimposed bacterial infections are possible; printed zpak to hold if symptoms persistent past 8-9 days, increasingly productive cough, developing fever (call back if this happens, will get CXR) Suggested symptomatic OTC remedies, voice rest Continue Symbicort PRN wheezing Nasal saline spray for congestion. Nasal steroids, allergy pill, oral steroids offered Fill hycodan only if promethazine insufficient to control cough at night to sleep, DO NOT TAKE WITH XANAX OR ALCOHOL. She expresses understanding.  Follow up as needed. -     promethazine-dextromethorphan (PROMETHAZINE-DM) 6.25-15 MG/5ML syrup; Take 5 mLs by mouth 4 (four) times daily as needed for cough. -     HYDROcodone-homatropine (HYCODAN) 5-1.5 MG/5ML syrup; Take 5 cc at night for severe cough as needed. Do NOT take with alcohol or xanax. Do not share, this is controlled substance. -     predniSONE (DELTASONE) 20 MG tablet; 2 tablets daily for 3 days, 1 tablet daily for 4 days.  Printed to hold:  -     azithromycin (ZITHROMAX) 250 MG tablet; Take 2 tablets (500 mg) on  Day 1,  followed by 1 tablet (250 mg) once daily on Days 2 through 5.  Further disposition pending results of labs. Discussed med's effects and SE's.   Over 15 minutes of exam, counseling, chart review, and critical decision making was performed.   Future Appointments  Date Time Provider Department Center  10/30/2018  2:00 PM Van Clines, MD [facility] None  11/02/2018  3:00 PM Judd Gaudier, NP [facility] None  03/01/2019  9:00 AM Judd Gaudier, NP [facility] None     ------------------------------------------------------------------------------------------------------------------   HPI BP 118/76   Pulse 91   Temp (!) 97.3 F (36.3 C)   Resp 16   Ht 5\' 4"  (1.626 m)   Wt 283 lb 9.6 oz (128.6 kg)   SpO2 98%   BMI 48.68 kg/m   48 y.o.female, former smoker, quit in 2004, estimated 8-9 pack year smoking history, presents for 4-5 days of predominantly dry hacky cough, sore throat (pt attributes to coughing), wheezing, headache (attributes to persistent couging), she has had chills but no fever when she has checked. Has had some body aches but this is improved. She denies chest pain/pressure, palpitations, PND, orthopnea, edema. She reports talking at work answering phone calls seems to trigger coughing episodes. Denies dyspnea, exertional fatigue. Speaks in complete sentences in office without distress, frequent hacky cough.   Was given symbicort via her office where she works which has resolved wheezing.   Dayquil, she tried tessalon 200 mg which was prescribed previously, OTC robitussin which has been insufficient to control cough. She reports   She has had tonsillectomy, denies allergies or history of asthma, son had similar symptoms early last week and has since recovered. She did have influenza vaccine this season. She is not on HBC, no recent travel, no personal or family history of clots.   Past Medical History:  Diagnosis Date  . Anxiety   . Depression   . Endometriosis   . Insomnia   . Kidney stones   . Migraines      Allergies  Allergen Reactions  . Sulfa Antibiotics Itching  . Amoxicillin Rash  . Penicillins Rash  Current Outpatient Medications on File Prior to Visit  Medication Sig  . ALPRAZolam (XANAX) 1 MG tablet TAKE 1 TABLET BY MOUTH 2 TIMES DAILY AS NEEDED FOR ANXIETY. AVOID TAKING DAILY TO LIMIT ADDICTION.  . nortriptyline (PAMELOR) 10 MG capsule Take 1 capsule every night for 1 week, then increase to 2 capsules every  night  . rizatriptan (MAXALT) 10 MG tablet Take 1 tablet as needed for migraine. Do not take more than 2-3 a week  . sertraline (ZOLOFT) 100 MG tablet Take 2 tabs by mouth daily for mood.  . traZODone (DESYREL) 150 MG tablet Take 1 tablet (150 mg total) by mouth at bedtime.  Marland Kitchen VYVANSE 60 MG capsule TAKE 1 CAPSULE BY MOUTH EVERY MORNING. AVOID TAKING DAILY. DON'T TAKE ON WEEKENDS.  . benzonatate (TESSALON) 200 MG capsule Take 1 capsule (200 mg total) by mouth every 8 (eight) hours as needed for cough.  . SUMAtriptan (IMITREX) 100 MG tablet TAKE 1 TABLET BY MOUTH AS NEEDED FOR MIGRAINE, MAY REPEAT IN 2 HOURS IF HEADACHE PERSISTS OR RECURS (Patient not taking: Reported on 10/21/2018)   No current facility-administered medications on file prior to visit.     ROS: all negative except above.   Physical Exam:  BP 118/76   Pulse 91   Temp (!) 97.3 F (36.3 C)   Resp 16   Ht 5\' 4"  (1.626 m)   Wt 283 lb 9.6 oz (128.6 kg)   SpO2 98%   BMI 48.68 kg/m   General Appearance: Well nourished, in no acute distress. Eyes: PERRLA, EOMs, conjunctiva no swelling or erythema Sinuses: No Frontal/maxillary tenderness ENT/Mouth: Ext aud canals clear, TMs without erythema, bulging. No erythema, swelling, or exudate on post pharynx.  Tonsils not swollen or erythematous. Hearing normal.  Neck: Supple, thyroid normal.  Respiratory: Respiratory effort normal, BS equal bilaterally without rales, rhonchi, wheezing or stridor. Frequent hacky cough  Cardio: RRR with no MRGs. Brisk peripheral pulses without edema.  Abdomen: Soft, + BS.  Non tender, no guarding, rebound, hernias, masses. Lymphatics: Non tender without lymphadenopathy.  Musculoskeletal: Full ROM, 5/5 strength, normal gait.  Skin: Warm, dry without rashes, lesions, ecchymosis.  Neuro: Cranial nerves intact. Normal muscle tone, no cerebellar symptoms. Sensation intact.  Psych: Awake and oriented X 3, normal affect, Insight and Judgment appropriate.      Dan Maker, NP 11:38 AM [facility]

## 2018-10-21 NOTE — Patient Instructions (Signed)
Start zpak if more productive cough, not improving in 2-3 days or developing fever  Call back if not getting better  ER for any sudden/severe symptoms or can't breathe   HOW TO TREAT VIRAL COUGH AND COLD SYMPTOMS:  -Symptoms usually last at least 1 week with the worst symptoms being around day 4.  - colds usually start with a sore throat and end with a cough, and the cough can take 2 weeks to get better.  -No antibiotics are needed for colds, flu, sore throats, cough, bronchitis UNLESS symptoms are longer than 7 days OR if you are getting better then get drastically worse.  -There are a lot of combination medications (Dayquil, Nyquil, Vicks 44, tyelnol cold and sinus, ETC). Please look at the ingredients on the back so that you are treating the correct symptoms and not doubling up on medications/ingredients.    Medicines you can use  Nasal congestion  Little Remedies saline spray (aerosol/mist)- can try this, it is in the kids section - pseudoephedrine (Sudafed)- behind the counter, do not use if you have high blood pressure, medicine that have -D in them.  - phenylephrine (Sudafed PE) -Dextormethorphan + chlorpheniramine (Coridcidin HBP)- okay if you have high blood pressure -Oxymetazoline (Afrin) nasal spray- LIMIT to 3 days -Saline nasal spray -Neti pot (used distilled or bottled water)  Ear pain/congestion  -pseudoephedrine (sudafed) - Nasonex/flonase nasal spray  Fever  -Acetaminophen (Tyelnol) -Ibuprofen (Advil, motrin, aleve)  Sore Throat  -Acetaminophen (Tyelnol) -Ibuprofen (Advil, motrin, aleve) -Drink a lot of water -Gargle with salt water - Rest your voice (don't talk) -Throat sprays -Cough drops  Body Aches  -Acetaminophen (Tyelnol) -Ibuprofen (Advil, motrin, aleve)  Headache  -Acetaminophen (Tyelnol) -Ibuprofen (Advil, motrin, aleve) - Exedrin, Exedrin Migraine  Allergy symptoms (cough, sneeze, runny nose, itchy eyes) -Claritin or loratadine cheapest  but likely the weakest  -Zyrtec or certizine at night because it can make you sleepy -The strongest is allegra or fexafinadine  Cheapest at [employer], [employer], [employer]  Cough  -Dextromethorphan (Delsym)- medicine that has DM in it -Guafenesin (Mucinex/Robitussin) - cough drops - drink lots of water  Chest Congestion  -Guafenesin (Mucinex/Robitussin)  Red Itchy Eyes  - Naphcon-A  Upset Stomach  - Bland diet (nothing spicy, greasy, fried, and high acid foods like tomatoes, oranges, berries) -OKAY- cereal, bread, soup, crackers, rice -Eat smaller more frequent meals -reduce caffeine, no alcohol -Loperamide (Imodium-AD) if diarrhea -Prevacid for heart burn  General health when sick  -Hydration -wash your hands frequently -keep surfaces clean -change pillow cases and sheets often -Get fresh air but do not exercise strenuously -Vitamin D, double up on it - Vitamin C -Zinc

## 2018-10-22 MED FILL — AZITHROMYCIN 250 MG TABLET: 250 | 5 days supply | Qty: 6 | Fill #0

## 2018-10-23 ENCOUNTER — Ambulatory Visit (INDEPENDENT_AMBULATORY_CARE_PROVIDER_SITE_OTHER): Payer: 59 | Admitting: Internal Medicine

## 2018-10-23 ENCOUNTER — Encounter: Payer: Self-pay | Admitting: Internal Medicine

## 2018-10-23 DIAGNOSIS — J4521 Mild intermittent asthma with (acute) exacerbation: Secondary | ICD-10-CM

## 2018-10-23 NOTE — Patient Instructions (Addendum)
Finish the Zpak and prednisone  Ok to use otc cold and flu remedies if they help  Stay well- hydrated  Order- neb xop 0.63    Dx acute bronchitis

## 2018-10-23 NOTE — Assessment & Plan Note (Signed)
We discussed viral syndrome.  This is not characteristic influenza. Plan-finish Z-Pak and prednisone which she has begun.  Use Symbicort 2 puffs twice daily at least through the weekend, hydration and symptomatic therapies as needed, neb treatment Xopenex today

## 2018-10-23 NOTE — Progress Notes (Signed)
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 Asked to be evaluated for acute onset cough. She feels that she caught respiratory infection 2 weeks ago, waking with cough productive yellow brown sputum, weakness, sore throat. No definite fever and no chills. GI okay. Had had flu vaccine shortly before this.  Took a breathing treatment yesterday and did have some relief, Trouble sleeping with the cough No definite history of asthma.  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  Managing as cough equivalent asthma with GERD ----ACUTE VISIT: PT las tseen 06-12-2016; Pt here for cough-dry. Pt notes drainage as well. Was seen via E-visit through [facility]-given meds that are not helping.  Waking at night with acute coughing paroxysms for which she has to sit up.  Dry cough during the day.  Sometimes hard to get air in and out comfortably.  No fever or chest pain, purulent sputum, palpitations or blood.  Significant stress now-separating from ex and closing on a house.  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  Managing as cough equivalent asthma with GERD -----Bronchitis: Pt given Zpak yesterday at PCP; prednisone on Wednesday and daytime cough syrup; Hycodan at night. Pt continues to feel bad-cough-green in color and wheezing  Son had a respiratory viral syndrome last week.  Over the last several days she has had increased cough, sputum turned green.  There may have been some sore throat at onset.  Denies fever, GI upset, myalgias, adenopathy.  Did have flu shot. Has used as a Symbicort 160 sample only as needed.  Started a Z-Pak yesterday and is in day 3 of a prednisone taper from PCP.  ROS-see HPI   + = positive Constitutional:    weight loss, night sweats, fevers, chills, fatigue, lassitude. HEENT:    headaches, difficulty swallowing, tooth/dental problems, sore throat,       sneezing, itching, ear ache, nasal congestion,  post nasal drip, snoring CV:    chest pain, orthopnea, PND, swelling in lower extremities, anasarca,                                             dizziness, palpitations Resp:   shortness of breath with exertion or at rest.                +productive cough,   non-productive cough, coughing up of blood.              +change in color of mucus.  wheezing.   Skin:    rash or lesions. GI:  No-   heartburn, indigestion, abdominal pain, nausea, vomiting, diarrhea,                 change in bowel habits, loss of appetite GU: dysuria, change in color of urine, no urgency or frequency.   flank pain. MS:   joint pain, stiffness, decreased range of motion, back pain. Neuro-     nothing unusual Psych:  change in mood or affect.  depression or anxiety.   memory loss.  OBJ- Physical Exam General- Alert, Oriented, Affect-appropriate, Distress- none acute, +obese Skin- rash-none, lesions- none, excoriation- none Lymphadenopathy- none Head- atraumatic            Eyes- Gross vision intact, PERRLA, conjunctivae and secretions clear            Ears- Hearing, canals-normal  Nose- Clear, no-Septal dev, mucus, polyps, erosion, perforation             Throat- Mallampati II , mucosa+ sl red , drainage- none, tonsils- atrophic Neck- flexible , trachea midline, no stridor , thyroid nl, carotid no bruit Chest - symmetrical excursion , unlabored           Heart/CV- RRR-rapid , no murmur , no gallop  , no rub, nl s1 s2                           - JVD- none , edema- none, stasis changes- none, varices- none           Lung- clear to P&A, wheeze- none, cough+ raspy , dullness-none, rub- none           Chest wall-  Abd-  Br/ Gen/ Rectal- Not done, not indicated Extrem- cyanosis- none, clubbing, none, atrophy- none, strength- nl Neuro- grossly intact to observation

## 2018-10-23 NOTE — Addendum Note (Signed)
Addended by: Jetty Duhamel D on: 10/23/2018 03:28 PM   Modules accepted: Level of Service

## 2018-10-26 ENCOUNTER — Ambulatory Visit: Payer: 59 | Admitting: Internal Medicine

## 2018-10-26 ENCOUNTER — Other Ambulatory Visit: Payer: Self-pay

## 2018-10-26 ENCOUNTER — Other Ambulatory Visit: Payer: Self-pay | Admitting: Adult Health

## 2018-10-26 DIAGNOSIS — F988 Other specified behavioral and emotional disorders with onset usually occurring in childhood and adolescence: Secondary | ICD-10-CM

## 2018-10-26 DIAGNOSIS — F3341 Major depressive disorder, recurrent, in partial remission: Secondary | ICD-10-CM

## 2018-10-26 DIAGNOSIS — Z0001 Encounter for general adult medical examination with abnormal findings: Secondary | ICD-10-CM

## 2018-10-26 MED ORDER — ALPRAZOLAM 1 MG PO TABS: ORAL_TABLET | ORAL | 0 refills | Status: DC

## 2018-10-26 MED ORDER — LISDEXAMFETAMINE DIMESYLATE 60 MG PO CAPS: ORAL_CAPSULE | ORAL | 0 refills | Status: DC

## 2018-10-27 MED FILL — VYVANSE 60 MG CAPSULE: 60 | 30 days supply | Qty: 20 | Fill #0

## 2018-10-27 MED FILL — ALPRAZolam 1 MG TABS: 1 | 35 days supply | Qty: 50 | Fill #0

## 2018-10-30 ENCOUNTER — Ambulatory Visit: Payer: 59 | Admitting: Neurology

## 2018-10-30 NOTE — Progress Notes (Deleted)
Complete Physical  Assessment and Plan:  Encounter for general adult medical examination with abnormal findings  Morbid obesity (BMI 45-49) (HCC) Long discussion about weight loss, diet, and exercise Recommended diet heavy in fruits and veggies and low in animal meats, cheeses, and dairy products, appropriate calorie intake Patient will work on diet via weight watchers Discussed appropriate weight for height  Follow up at next visit -     TSH  Other abnormal glucose Discussed general issues about diabetes pathophysiology and management., Educational material distributed., Suggested low cholesterol diet., Encouraged aerobic exercise., Discussed foot care., Reminded to get yearly retinal exam. -     TSH -     Hemoglobin A1c -     Urinalysis, Routine w reflex microscopic (not at [facility]) -     Microalbumin / creatinine urine ratio -     EKG 12-Lead  Other migraine without status migrainosus, not intractable Now managed by Dr. Karel Jarvis ***  Kidney stones Increase fluids  Insomnia, unspecified type Continue medications  Recurrent major depressive disorder, in partial remission with anxiety Continue medications Helps with focus, no AE's. The patient was counseled on the addictive nature of the medication and was encouraged to take drug holidays when not needed.  -     lisdexamfetamine (VYVANSE) 60 MG capsule; Take 1 capsule (60 mg total) by mouth every morning. -     sertraline (ZOLOFT) 100 MG tablet; Take 2 tablet (100 mg total) by mouth daily.  Vitamin D deficiency -     VITAMIN D 25 Hydroxy (Vit-D Deficiency, Fractures)  Medication management -     CBC with Differential/Platelet -     CMP/GFR -     Magnesium  Attention deficit disorder, unspecified hyperactivity presence Continue medications Helps with focus, no AE's. The patient was counseled on the addictive nature of the medication and was encouraged to take drug holidays when not needed.  -     lisdexamfetamine  (VYVANSE) 60 MG capsule; Take 1 capsule (60 mg total) by mouth every morning.  Hyperlipidemia, unspecified hyperlipidemia type If similarly elevated discussed recommend initiating on statin *** Continue low cholesterol diet and exercise.  Check lipid panel.  -     Lipid panel -     EKG 12-Lead  Anemia       -     Vitamin B12  Discussed med's effects and SE's. Screening labs and tests as requested with regular follow-up as recommended. Over 40 minutes of exam, counseling, chart review, and complex, high level critical decision making was performed this visit.   Future Appointments  Date Time Provider Department Center  11/02/2018  3:00 PM Judd Gaudier, NP [facility] None  03/01/2019  9:00 AM Judd Gaudier, NP [facility] None  11/03/2019  3:00 PM Judd Gaudier, NP [facility] None     HPI  48 y.o. female  presents for a complete physical and follow up for has Migraines; Kidney stones; Insomnia; 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); Morbid obesity with BMI of 45.0-49.9, adult (HCC); Other abnormal glucose; Vitamin D deficiency; ADD (attention deficit disorder); Asthmatic bronchitis with exacerbation; and Hyperlipidemia on their problem list.   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 Maple Hudson  She has anxiety/depression controlled with zoloft, xanax 1 at lunch, 1 at night and on trazodone at night ***, reports stress levels are very high at work,  has a foreclosure situation at home, ex and legal concerns, etc. She does report she can sleep through the night.   Patient is on an ADD medication, vyvanse 20 mg daily PRN. she states that the medication is helping and she denies any adverse reactions. She is doing very well on the vyvanse, very good focus without physical symptoms however she would have to take a  second one around 1-2 pm.    She has dx of migraines, associated with stress and menstrual cycle, due to frequent sick days affecting work was referred to Dr. Karel Jarvis for management;  She is now on trial of nortryptiline 20 mg at night as prophylactic *** imitrex vs maxalt for abortive ***   Currently having 1-2 per month, significantly limits her and has to call out frequently r/t this. She is on nadolol 20 mg daily for prophylaxis, takes imitrex 100 mg as abortive. Can't tell if nadolol has helped.   BMI is There is no height or weight on file to calculate BMI., she has been working on diet (has started [employer]), exercise is limited.  Wt Readings from Last 3 Encounters:  10/23/18 283 lb (128.4 kg)  10/21/18 283 lb 9.6 oz (128.6 kg)  07/28/18 274 lb (124.3 kg)   Her blood pressure has been controlled at home, today their BP is   She does not workout. She denies chest pain, shortness of breath, dizziness.   She is not on cholesterol medication and denies myalgias. Her cholesterol is not at goal. The cholesterol last visit was:   Lab Results  Component Value Date   CHOL 210 (H) 02/24/2018   HDL 40 (L) 02/24/2018   LDLCALC 139 (H) 02/24/2018   TRIG 174 (H) 02/24/2018   CHOLHDL 5.3 (H) 02/24/2018   She has been working on diet and exercise for glucose management, and denies hypoglycemia , paresthesia of the feet, polydipsia, polyuria and visual disturbances. Last A1C in the office was:  Lab Results  Component Value Date   HGBA1C 5.1 02/24/2018   Patient is *** on Vitamin D supplement.   Lab Results  Component Value Date   VD25OH 28 (L) 02/24/2018       Lab Results  Component Value Date   GFRNONAA 83 02/24/2018       Current Medications:  Current Outpatient Medications on File Prior to Visit  Medication Sig Dispense Refill  . ALPRAZolam (XANAX) 1 MG tablet Take 1/2-1 tablet 1 or 2 x /day ONLY if needed for Anxiety Attack &  limit to 5 days /week to avoid addiction 50  tablet 0  . benzonatate (TESSALON) 200 MG capsule Take 1 capsule (200 mg total) by mouth every 8 (eight) hours as needed for cough. (Patient not taking: Reported on 10/23/2018) 40 capsule 5  . HYDROcodone-homatropine (HYCODAN) 5-1.5 MG/5ML syrup Take 5 cc at night for severe cough as needed. Do NOT take with alcohol or xanax. Do not share, this is controlled substance. 120 mL 0  . lisdexamfetamine (VYVANSE) 60 MG capsule TAKE 1 CAPSULE  EVERY MORNING. AVOID TAKING DAILY. DON'T TAKE ON WEEKENDS. 20 capsule 0  . nortriptyline (PAMELOR) 10 MG capsule Take 1 capsule every night for 1 week, then increase to 2 capsules every night 60 capsule 5  . predniSONE (DELTASONE) 20 MG tablet 2 tablets daily for 3 days, 1 tablet daily for 4 days. 10 tablet 0  . promethazine-dextromethorphan (PROMETHAZINE-DM) 6.25-15 MG/5ML syrup Take 5 mLs by mouth 4 (four) times daily as needed for  cough. 240 mL 1  . rizatriptan (MAXALT) 10 MG tablet Take 1 tablet as needed for migraine. Do not take more than 2-3 a week 10 tablet 11  . sertraline (ZOLOFT) 100 MG tablet Take 2 tabs by mouth daily for mood. 90 tablet 1  . SUMAtriptan (IMITREX) 100 MG tablet TAKE 1 TABLET BY MOUTH AS NEEDED FOR MIGRAINE, MAY REPEAT IN 2 HOURS IF HEADACHE PERSISTS OR RECURS 10 tablet 2  . traZODone (DESYREL) 150 MG tablet Take 1 tablet (150 mg total) by mouth at bedtime. 90 tablet 1   No current facility-administered medications on file prior to visit.    Allergies:  Allergies  Allergen Reactions  . Sulfa Antibiotics Itching  . Amoxicillin Rash  . Penicillins Rash   Medical History:  She has Migraines; Kidney stones; Insomnia; 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); Morbid obesity with BMI of 45.0-49.9, adult (HCC); Other abnormal glucose; Vitamin D deficiency; ADD (attention deficit disorder); Asthmatic bronchitis with exacerbation; and Hyperlipidemia on their problem list.   Health Maintenance:   Immunization History   Administered Date(s) Administered  . Influenza,inj,Quad PF,6+ Mos 06/04/2016  . Influenza-Unspecified 05/29/2017, 06/09/2018  . Pneumococcal-Unspecified 09/02/1996  . Td 10/05/2002   Tetanus:2016 Pneumovax: 1998 Prevnar 13:  Flu vaccine: 2018  Zostavax:  No LMP recorded. Pap: 10/2015 Dr. Audie Box MGM: 07/2016 DUE will get at Dr. Audie Box DEXA: N/A Colonoscopy: N/A EGD: N/A CXR 2016  Last eye exam:  Last dental exam:   Patient Care Team: Lucky Cowboy, MD as PCP - General (Internal Medicine) Quentin Mulling, PA-C (Physician Assistant)  Surgical History:  She has a past surgical history that includes Cesarean section; Pelvic laparoscopy (2004); Tonsillectomy (2011); and Wisdom tooth extraction (1990). Family History:  Herfamily history includes ADD / ADHD in her daughter and son; Alcohol abuse in her father; Alzheimer's disease in her maternal aunt, maternal grandfather, and maternal grandmother; Anxiety disorder in her mother; Autism in her son; Celiac disease in her sister; Depression in her father and mother; Heart attack in her paternal grandmother; Hypertension in her mother; Migraines in her mother; Stroke in her paternal grandfather. Social History:  She reports that she quit smoking about 15 years ago. She has never used smokeless tobacco. She reports current alcohol use of about 14.0 standard drinks of alcohol per week. She reports that she does not use drugs.  Review of Systems: Review of Systems  Constitutional: Negative.  Negative for malaise/fatigue and weight loss.  HENT: Negative.  Negative for hearing loss and tinnitus.   Eyes: Negative.  Negative for blurred vision and double vision.  Respiratory: Negative.  Negative for cough, sputum production, shortness of breath and wheezing.   Cardiovascular: Negative.  Negative for chest pain, palpitations, orthopnea, claudication, leg swelling and PND.  Gastrointestinal: Negative.  Negative for abdominal pain, blood  in stool, constipation, diarrhea, heartburn, melena, nausea and vomiting.  Genitourinary: Negative.   Musculoskeletal: Negative.  Negative for falls, joint pain and myalgias.  Skin: Negative.  Negative for rash.  Neurological: Positive for headaches. Negative for dizziness, tingling, sensory change and weakness.  Endo/Heme/Allergies: Negative.  Negative for polydipsia.  Psychiatric/Behavioral: Positive for depression. Negative for memory loss, substance abuse and suicidal ideas. The patient is nervous/anxious and has insomnia.   All other systems reviewed and are negative.   Physical Exam: Estimated body mass index is 48.58 kg/m as calculated from the following:   Height as of 10/23/18:  (1.626 m).   Weight as of 10/23/18: 283 lb (  128.4 kg). There were no vitals taken for this visit. General Appearance: Well nourished, in no apparent distress.  Eyes: PERRLA, EOMs, conjunctiva no swelling or erythema, normal fundi and vessels.  Sinuses: No Frontal/maxillary tenderness  ENT/Mouth: Ext aud canals clear, normal light reflex with TMs without erythema, bulging. Good dentition. No erythema, swelling, or exudate on post pharynx. Tonsils not swollen or erythematous. Hearing normal.  Neck: Supple, thyroid normal. No bruits  Respiratory: Respiratory effort normal, BS equal bilaterally without rales, rhonchi, wheezing or stridor.  Cardio: RRR without murmurs, rubs or gallops. Brisk peripheral pulses without edema.  Chest: symmetric, with normal excursions and percussion.  Breasts: Defer to GYN Abdomen: Soft, nontender, no guarding, rebound, hernias, masses, or organomegaly.  Lymphatics: Non tender without lymphadenopathy.  Genitourinary: Defer to GYN Musculoskeletal: Full ROM all peripheral extremities,5/5 strength, and normal gait.  Skin: Warm, dry without rashes, lesions, ecchymosis. Neuro: Cranial nerves intact, reflexes equal bilaterally. Normal muscle tone, no cerebellar symptoms. Sensation  intact.  Psych: Awake and oriented X 3, normal affect, Insight and Judgment appropriate.   EKG: WNL no ST changes.  Dan Maker 9:35 AM [facility]

## 2018-11-02 ENCOUNTER — Telehealth: Payer: Self-pay | Admitting: Internal Medicine

## 2018-11-02 ENCOUNTER — Other Ambulatory Visit: Payer: Self-pay

## 2018-11-02 ENCOUNTER — Ambulatory Visit (INDEPENDENT_AMBULATORY_CARE_PROVIDER_SITE_OTHER)
Admission: RE | Admit: 2018-11-02 | Discharge: 2018-11-02 | Disposition: A | Discharge status: Home / Self Care | Payer: 59 | Source: Ambulatory Visit | Attending: Internal Medicine | Admitting: Internal Medicine

## 2018-11-02 ENCOUNTER — Encounter: Payer: 59 | Admitting: Adult Health

## 2018-11-02 DIAGNOSIS — R05 Cough: Secondary | ICD-10-CM | POA: Diagnosis not present

## 2018-11-02 DIAGNOSIS — J209 Acute bronchitis, unspecified: Secondary | ICD-10-CM

## 2018-11-02 MED ORDER — HYDROCODONE-HOMATROPINE 5-1.5 MG/5ML PO SYRP: ORAL_SOLUTION | ORAL | 0 refills | Status: DC

## 2018-11-02 NOTE — Telephone Encounter (Signed)
Primary Pulmonologist: CY Last office visit and with whom: 10/23/2018 with CY What do we see them for (pulmonary problems): mild intermittent asthmatic bronchitis with acute exacerbation  Last OV assessment/plan:  1. Waymon Budge, MD (Physician) at 10/23/2018 10:13 AM - Signed    Doreatha Martin the Zpak and prednisone  Ok to use otc cold and flu remedies if they help  Stay well- hydrated  Order- neb xop 0.63    Dx acute bronchitis     Was appointment offered to patient (explain)?  Yes, but would like CY recommendations first.  Reason for call: Pt has increase productive cough-clear, SOB with cough, chest tightness and rib pain when coughing. Pt has finished prednisone taper and Z-Pak and feels no better. Pt takes hycodan syrup at night, some relief but is not able to take during the day. Pt states she's taking cough drops and having ice chips that helps the cough some. Pt denies fever, chills or any body aches or pains.  Allergies  Allergen Reactions  . Sulfa Antibiotics Itching  . Amoxicillin Rash  . Penicillins Rash   Current Outpatient Medications on File Prior to Visit  Medication Sig Dispense Refill  . ALPRAZolam (XANAX) 1 MG tablet Take 1/2-1 tablet 1 or 2 x /day ONLY if needed for Anxiety Attack &  limit to 5 days /week to avoid addiction 50 tablet 0  . benzonatate (TESSALON) 200 MG capsule Take 1 capsule (200 mg total) by mouth every 8 (eight) hours as needed for cough. (Patient not taking: Reported on 10/23/2018) 40 capsule 5  . HYDROcodone-homatropine (HYCODAN) 5-1.5 MG/5ML syrup Take 5 cc at night for severe cough as needed. Do NOT take with alcohol or xanax. Do not share, this is controlled substance. 120 mL 0  . lisdexamfetamine (VYVANSE) 60 MG capsule TAKE 1 CAPSULE  EVERY MORNING. AVOID TAKING DAILY. DON'T TAKE ON WEEKENDS. 20 capsule 0  . nortriptyline (PAMELOR) 10 MG capsule Take 1 capsule every night for 1 week, then increase to 2 capsules every night 60 capsule 5  .  predniSONE (DELTASONE) 20 MG tablet 2 tablets daily for 3 days, 1 tablet daily for 4 days. 10 tablet 0  . promethazine-dextromethorphan (PROMETHAZINE-DM) 6.25-15 MG/5ML syrup Take 5 mLs by mouth 4 (four) times daily as needed for cough. 240 mL 1  . rizatriptan (MAXALT) 10 MG tablet Take 1 tablet as needed for migraine. Do not take more than 2-3 a week 10 tablet 11  . sertraline (ZOLOFT) 100 MG tablet Take 2 tabs by mouth daily for mood. 90 tablet 1  . SUMAtriptan (IMITREX) 100 MG tablet TAKE 1 TABLET BY MOUTH AS NEEDED FOR MIGRAINE, MAY REPEAT IN 2 HOURS IF HEADACHE PERSISTS OR RECURS 10 tablet 2  . traZODone (DESYREL) 150 MG tablet Take 1 tablet (150 mg total) by mouth at bedtime. 90 tablet 1   No current facility-administered medications on file prior to visit.     CY please advise.

## 2018-11-02 NOTE — Telephone Encounter (Signed)
LMTCB

## 2018-11-02 NOTE — Telephone Encounter (Signed)
Hycodan refilled for ongoing cough. CXR  Has been ordered.

## 2018-11-03 ENCOUNTER — Telehealth: Payer: Self-pay | Admitting: Internal Medicine

## 2018-11-03 MED ORDER — DOXYCYCLINE HYCLATE 100 MG PO TABS: 100.0000 mg | ORAL_TABLET | Freq: Two times a day (BID) | ORAL | 0 refills | Status: DC

## 2018-11-03 MED ORDER — BENZONATATE 200 MG PO CAPS: 200.0000 mg | ORAL_CAPSULE | Freq: Three times a day (TID) | ORAL | 5 refills | Status: DC | PRN

## 2018-11-03 NOTE — Telephone Encounter (Signed)
Spoke with pt, she is coughing non-stop and coughing up dark yellow phelgm. Her ribs and back hurt from coughing. She is taking the promethazine cough syrup but it doesn't work really well. The cough is horrible at night and she is very fatigued from coughing. She doesn't have a fever but had some night sweats and headache. CY please advise on next step. [facility]   treatment Xopenex today    Patient Instructions by Waymon Budge, MD at 10/23/2018 11:30 AM  Author: Waymon Budge, MD Author Type: Physician Filed: 10/23/2018 10:20 AM  Note Status: Addendum Cosign: Cosign Not Required Encounter Date: 10/23/2018  Editor: Waymon Budge, MD (Physician)  Prior Versions: 1. Waymon Budge, MD (Physician) at 10/23/2018 10:13 AM - Signed    Doreatha Martin the Zpak and prednisone  Ok to use otc cold and flu remedies if they help  Stay well- hydrated  Order- neb xop 0.63    Dx acute bronchitis

## 2018-11-03 NOTE — Telephone Encounter (Signed)
Pt is not here today. Called pt and advised message from the provider. Pt understood and verbalized understanding. Nothing further is needed.   Rx's for Doxy and Tessalon sent to [facility].

## 2018-11-03 NOTE — Telephone Encounter (Signed)
ATC pt, no answer. Left message for pt to call back.  

## 2018-11-03 NOTE — Telephone Encounter (Signed)
If she is here today I can see her, and would give a neb treatment and depo shot. If she is not here, then Rx doxycycline 100 mg, # 14, 1 twice daily                                           Add benzonatate perles 200 mg, # 40, 1 every 8 hours as needed for cough

## 2018-11-24 ENCOUNTER — Other Ambulatory Visit: Payer: Self-pay

## 2018-11-24 DIAGNOSIS — Z0001 Encounter for general adult medical examination with abnormal findings: Secondary | ICD-10-CM

## 2018-11-24 DIAGNOSIS — F988 Other specified behavioral and emotional disorders with onset usually occurring in childhood and adolescence: Secondary | ICD-10-CM

## 2018-11-24 DIAGNOSIS — F3341 Major depressive disorder, recurrent, in partial remission: Secondary | ICD-10-CM

## 2018-11-24 MED ORDER — ALPRAZOLAM 1 MG PO TABS: ORAL_TABLET | ORAL | 0 refills | Status: DC

## 2018-11-24 MED ORDER — LISDEXAMFETAMINE DIMESYLATE 60 MG PO CAPS: ORAL_CAPSULE | ORAL | 0 refills | Status: DC

## 2018-11-25 ENCOUNTER — Other Ambulatory Visit: Payer: Self-pay | Admitting: Adult Health

## 2018-11-25 DIAGNOSIS — Z0001 Encounter for general adult medical examination with abnormal findings: Secondary | ICD-10-CM

## 2018-11-25 DIAGNOSIS — F3341 Major depressive disorder, recurrent, in partial remission: Secondary | ICD-10-CM

## 2018-11-25 DIAGNOSIS — F419 Anxiety disorder, unspecified: Secondary | ICD-10-CM

## 2018-11-25 MED ORDER — ALPRAZOLAM 1 MG PO TABS: ORAL_TABLET | ORAL | 0 refills | Status: DC

## 2018-11-25 MED FILL — PROMETHAZINE W/DM SYRUP: 6.25-15 | 12 days supply | Qty: 240 | Fill #1

## 2018-11-25 MED FILL — traZODone HCL 150 MG TABS: 150 | 90 days supply | Qty: 90 | Fill #1

## 2018-11-25 MED FILL — RIZATRIPTAN BENZOATE 10 MG: 10 | 22 days supply | Qty: 10 | Fill #2

## 2018-11-25 MED FILL — NORTRIPTYLINE HCL 10 MG CAP: 10 | 30 days supply | Qty: 53 | Fill #1

## 2018-11-25 MED FILL — SERTRALINE HCL 100 MG TAB: 100 | 45 days supply | Qty: 90 | Fill #0

## 2018-11-25 NOTE — Telephone Encounter (Signed)
Please resend Vyvanse to [facility]. Will [facility] and cancel rx. In que for review.

## 2018-12-01 ENCOUNTER — Other Ambulatory Visit: Payer: Self-pay | Admitting: Internal Medicine

## 2018-12-01 DIAGNOSIS — F3341 Major depressive disorder, recurrent, in partial remission: Secondary | ICD-10-CM

## 2018-12-01 DIAGNOSIS — Z0001 Encounter for general adult medical examination with abnormal findings: Secondary | ICD-10-CM

## 2018-12-01 DIAGNOSIS — F988 Other specified behavioral and emotional disorders with onset usually occurring in childhood and adolescence: Secondary | ICD-10-CM

## 2018-12-01 MED FILL — VYVANSE 60 MG CAPSULE: 60 | 20 days supply | Qty: 20 | Fill #0

## 2018-12-24 ENCOUNTER — Ambulatory Visit: Payer: 59 | Admitting: Internal Medicine

## 2018-12-30 ENCOUNTER — Other Ambulatory Visit: Payer: Self-pay

## 2018-12-31 MED ORDER — ALPRAZOLAM 1 MG PO TABS: ORAL_TABLET | ORAL | 0 refills | Status: DC

## 2019-02-03 ENCOUNTER — Other Ambulatory Visit: Payer: Self-pay

## 2019-02-04 ENCOUNTER — Other Ambulatory Visit: Payer: Self-pay | Admitting: Adult Health

## 2019-02-04 MED ORDER — AMPHETAMINE-DEXTROAMPHETAMINE 20 MG PO TABS: 20.0000 mg | ORAL_TABLET | Freq: Every day | ORAL | 0 refills | Status: DC

## 2019-02-04 MED ORDER — ALPRAZOLAM 1 MG PO TABS: ORAL_TABLET | ORAL | 0 refills | Status: DC

## 2019-02-05 ENCOUNTER — Other Ambulatory Visit: Payer: Self-pay | Admitting: Adult Health

## 2019-02-05 MED ORDER — AMPHETAMINE-DEXTROAMPHETAMINE 20 MG PO TABS: 20.0000 mg | ORAL_TABLET | Freq: Every day | ORAL | 0 refills | Status: DC

## 2019-02-24 ENCOUNTER — Other Ambulatory Visit: Payer: Self-pay | Admitting: Adult Health

## 2019-02-24 MED ORDER — TRAZODONE HCL 150 MG PO TABS: 150.0000 mg | ORAL_TABLET | Freq: Every day | ORAL | 1 refills | Status: DC

## 2019-02-25 NOTE — Progress Notes (Deleted)
Complete Physical  Assessment and Plan:  Encounter for general adult medical examination with abnormal findings  Morbid obesity (BMI 40.37) Long discussion about weight loss, diet, and exercise Recommended diet heavy in fruits and veggies and low in animal meats, cheeses, and dairy products, appropriate calorie intake Patient will work on diet via weight watchers Discussed appropriate weight for height  Follow up at next visit -     TSH  Other abnormal glucose Discussed general issues about diabetes pathophysiology and management., Educational material distributed., Suggested low cholesterol diet., Encouraged aerobic exercise., Discussed foot care., Reminded to get yearly retinal exam. -     TSH -     Hemoglobin A1c -     Urinalysis, Routine w reflex microscopic (not at [facility]) -     Microalbumin / creatinine urine ratio -     EKG 12-Lead  Other migraine without status migrainosus, not intractable  continue nadolol for now, ? Hx of good prophylaxis with topamax, will try this with nadolol and if doing well can try to taper off BB Continue imitrex Refer to neuro if not improving to discuss novel agents Follow up 3 months  Kidney stones Increase fluids  Anxiety Increase zoloft to 150 mg daily, then to 200 mg  Stress management techniques discussed, increase water, good sleep hygiene discussed, increase exercise, and increase veggies.  -     sertraline (ZOLOFT) 100 MG tablet; Take 2 tablet (100 mg total) by mouth daily.  Insomnia, unspecified type Continue medications  Recurrent major depressive disorder, in partial remission (HCC) -     lisdexamfetamine (VYVANSE) 60 MG capsule; Take 1 capsule (60 mg total) by mouth every morning. -     sertraline (ZOLOFT) 100 MG tablet; Take 2 tablet (100 mg total) by mouth daily.  Vitamin D deficiency -     VITAMIN D 25 Hydroxy (Vit-D Deficiency, Fractures)  Medication management -     CBC with Differential/Platelet -     CMP/GFR -      Magnesium  Attention deficit disorder, unspecified hyperactivity presence -     lisdexamfetamine (VYVANSE) 60 MG capsule; Take 1 capsule (60 mg total) by mouth every morning.  Hyperlipidemia, unspecified hyperlipidemia type -     Lipid panel -     EKG 12-Lead   Discussed med's effects and SE's. Screening labs and tests as requested with regular follow-up as recommended. Over 40 minutes of exam, counseling, chart review, and complex, high level critical decision making was performed this visit.   Future Appointments  Date Time Provider Department Center  03/01/2019  9:00 AM Judd Gaudierorbett, Connie Hilgert, NP [facility] None  03/07/2020  9:00 AM Judd Gaudierorbett, [place] Welcome, NP [facility] None     HPI  48 y.o. 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   ***, works ***, kids ***   *** no insurance?   Patient is on an ADD medication, she states that the medication is helping and she denies any adverse reactions. She was doing very well on the vyvanse, very good focus without physical symptoms however she would have to take a second one around 1-2 pm, but temporarily switched to adderall due to being in between jobs and without insurance ***  She has anxiety/depression controlled with zoloft 200 mg daily, xanax 1  at lunch, 1 at night and on trazodone at night, reports stress levels are very high at work, has a foreclosure situation at home, ex and legal concerns, etc. She does report she can sleep through the night.    She has dx of migraines, associated with stress and menstrual cycle. Was poorly controlled last year, missing significant work and referred to Dr. Delice Lesch, now on amitriptylene 20 mg daily for prophylaxis, takes imitrex  100 mg *** vs maxalt *** as abortive and ***  BMI is There is no height or weight on file to calculate BMI., she has been working on diet (has started [employer]), exercise is limited.  Wt Readings from Last 3 Encounters:  10/23/18 283 lb (128.4 kg)  10/21/18 283 lb 9.6 oz (128.6 kg)  07/28/18 274 lb (124.3 kg)   Her blood pressure has been controlled at home, today their BP is   She does not workout. She denies chest pain, shortness of breath, dizziness.   She is not on cholesterol medication and denies myalgias. Her cholesterol is not at goal. The cholesterol last visit was:   Lab Results  Component Value Date   CHOL 210 (H) 02/24/2018   HDL 40 (L) 02/24/2018   LDLCALC 139 (H) 02/24/2018   TRIG 174 (H) 02/24/2018   CHOLHDL 5.3 (H) 02/24/2018   She has been working on diet and exercise for prediabetes, and denies hypoglycemia , paresthesia of the feet, polydipsia, polyuria and visual disturbances. Last A1C in the office was:  Lab Results  Component Value Date   HGBA1C 5.1 02/24/2018   Patient is on Vitamin D supplement.   Lab Results  Component Value Date   VD25OH 28 (L) 02/24/2018       Current Medications:  Current Outpatient Medications on File Prior to Visit  Medication Sig Dispense Refill  . ALPRAZolam (XANAX) 1 MG tablet Take 1/2-1 tablet 1 or 2 x /day ONLY if needed for Anxiety Attack &  limit to 5 days /week to avoid addiction 50 tablet 0  . amphetamine-dextroamphetamine (ADDERALL) 20 MG tablet Take 1 tablet (20 mg total) by mouth daily. 30 tablet 0  . benzonatate (TESSALON) 200 MG capsule Take 1 capsule (200 mg total) by mouth every 8 (eight) hours as needed for cough. 40 capsule 5  . doxycycline (VIBRA-TABS) 100 MG tablet Take 1 tablet (100 mg total) by mouth 2 (two) times daily. 14 tablet 0  . HYDROcodone-homatropine (HYCODAN) 5-1.5 MG/5ML syrup Take 5 cc at night for severe cough as needed. Do NOT take with alcohol or xanax. Do not share, this is controlled  substance. 120 mL 0  . nortriptyline (PAMELOR) 10 MG capsule Take 1 capsule every night for 1 week, then increase to 2 capsules every night 60 capsule 5  . predniSONE (DELTASONE) 20 MG tablet 2 tablets daily for 3 days, 1 tablet daily for 4 days. 10 tablet 0  . promethazine-dextromethorphan (PROMETHAZINE-DM) 6.25-15 MG/5ML syrup Take 5 mLs by mouth 4 (four) times daily as needed for cough. 240 mL 1  . rizatriptan (MAXALT) 10 MG tablet Take 1 tablet as needed for migraine. Do not take more than 2-3 a week 10 tablet 11  . sertraline (ZOLOFT) 100 MG tablet TAKE 2 TABLETS BY MOUTH DAILY FOR MOOD. 90 tablet 1  . SUMAtriptan (IMITREX) 100 MG tablet TAKE 1 TABLET BY MOUTH AS NEEDED FOR MIGRAINE, MAY REPEAT IN 2 HOURS IF HEADACHE PERSISTS OR RECURS 10 tablet 2  . traZODone (DESYREL) 150 MG tablet  Take 1 tablet (150 mg total) by mouth at bedtime. 90 tablet 1   No current facility-administered medications on file prior to visit.    Allergies:  Allergies  Allergen Reactions  . Sulfa Antibiotics Itching  . Amoxicillin Rash  . 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   Health Maintenance:   Immunization History  Administered Date(s) Administered  . Influenza,inj,Quad PF,6+ Mos 06/04/2016  . Influenza-Unspecified 05/29/2017, 06/09/2018  . Pneumococcal-Unspecified 09/02/1996  . Td 10/05/2002   Tetanus:2016 Pneumovax: 1998 Prevnar 13:  Flu vaccine: 2018  Zostavax:  No LMP recorded. Pap: 10/2015 Dr. Audie BoxFontaine MGM: 07/2016 DUE will get at Dr. Audie BoxFontaine DEXA: N/A Colonoscopy: N/A EGD: N/A CXR 2016  Patient Care Team: Lucky CowboyMcKeown, William, MD as PCP - General (Internal Medicine) Quentin Mullingollier, Amanda, PA-C (Physician  Assistant)  Surgical History:  She has a past surgical history that includes Cesarean section; Pelvic laparoscopy (2004); Tonsillectomy (2011); and Wisdom tooth extraction (1990). Family History:  Herfamily history includes ADD / ADHD in her daughter and son; Alcohol abuse in her father; Alzheimer's disease in her maternal aunt, maternal grandfather, and maternal grandmother; Anxiety disorder in her mother; Autism in her son; Celiac disease in her sister; Depression in her father and mother; Heart attack in her paternal grandmother; Hypertension in her mother; Migraines in her mother; Stroke in her paternal grandfather. Social History:  She reports that she quit smoking about 15 years ago. She has never used smokeless tobacco. She reports current alcohol use of about 14.0 standard drinks of alcohol per week. She reports that she does not use drugs.  Review of Systems: Review of Systems  Constitutional: Negative.  Negative for malaise/fatigue and weight loss.  HENT: Negative.  Negative for hearing loss and tinnitus.   Eyes: Negative.  Negative for blurred vision and double vision.  Respiratory: Negative.  Negative for cough, sputum production, shortness of breath and wheezing.   Cardiovascular: Negative.  Negative for chest pain, palpitations, orthopnea, claudication, leg swelling and PND.  Gastrointestinal: Negative.  Negative for abdominal pain, blood in stool, constipation, diarrhea, heartburn, melena, nausea and vomiting.  Genitourinary: Negative.   Musculoskeletal: Negative.  Negative for falls, joint pain and myalgias.  Skin: Negative.  Negative for rash.  Neurological: Positive for headaches. Negative for dizziness, tingling, sensory change and weakness.  Endo/Heme/Allergies: Negative.  Negative for polydipsia.  Psychiatric/Behavioral: Positive for depression. Negative for memory loss, substance abuse and suicidal ideas. The patient is nervous/anxious and has insomnia.   All other  systems reviewed and are negative.   Physical Exam: Estimated body mass index is 48.58 kg/m as calculated from the following:   Height as of 10/23/18: 5\' 4"  (1.626 m).   Weight as of 10/23/18: 283 lb (128.4 kg). There were no vitals taken for this visit. General Appearance: Well nourished, in no apparent distress.  Eyes: PERRLA, EOMs, conjunctiva no swelling or erythema, normal fundi and vessels.  Sinuses: No Frontal/maxillary tenderness  ENT/Mouth: Ext aud canals clear, normal light reflex with TMs without erythema, bulging. Good dentition. No erythema, swelling, or exudate on post pharynx. Tonsils not swollen or erythematous. Hearing normal.  Neck: Supple, thyroid normal. No bruits  Respiratory: Respiratory effort normal, BS equal bilaterally without rales, rhonchi, wheezing or stridor.  Cardio: RRR without murmurs, rubs or gallops.  Brisk peripheral pulses without edema.  Chest: symmetric, with normal excursions and percussion.  Breasts: Defer to GYN Abdomen: Soft, nontender, no guarding, rebound, hernias, masses, or organomegaly.  Lymphatics: Non tender without lymphadenopathy.  Genitourinary: Defer to GYN Musculoskeletal: Full ROM all peripheral extremities,5/5 strength, and normal gait.  Skin: Warm, dry without rashes, lesions, ecchymosis. Neuro: Cranial nerves intact, reflexes equal bilaterally. Normal muscle tone, no cerebellar symptoms. Sensation intact.  Psych: Awake and oriented X 3, normal affect, Insight and Judgment appropriate.   EKG: WNL no ST changes. AORTA SCAN: defer  Dan Makershley C Trystyn Dolley 1:39 PM [facility]

## 2019-03-01 ENCOUNTER — Encounter: Payer: Self-pay | Admitting: Adult Health

## 2019-03-11 ENCOUNTER — Other Ambulatory Visit: Payer: Self-pay | Admitting: Adult Health

## 2019-03-11 MED ORDER — ALPRAZOLAM 1 MG PO TABS: ORAL_TABLET | ORAL | 0 refills | Status: DC

## 2019-04-11 ENCOUNTER — Other Ambulatory Visit: Payer: Self-pay

## 2019-04-12 MED ORDER — AMPHETAMINE-DEXTROAMPHETAMINE 20 MG PO TABS: ORAL_TABLET | ORAL | 0 refills | Status: DC

## 2019-04-12 MED ORDER — ALPRAZOLAM 1 MG PO TABS: ORAL_TABLET | ORAL | 0 refills | Status: DC

## 2019-04-14 ENCOUNTER — Other Ambulatory Visit: Payer: Self-pay | Admitting: Adult Health

## 2019-04-14 MED ORDER — ALPRAZOLAM 1 MG PO TABS: ORAL_TABLET | ORAL | 0 refills | Status: DC

## 2019-04-14 NOTE — Telephone Encounter (Signed)
Patient just went and picked up her prescription for Alprazolam and only received 6 tablets when usually she takes one tablet BID. Please advise.

## 2019-05-18 ENCOUNTER — Encounter: Payer: Self-pay | Admitting: Adult Health

## 2019-05-24 ENCOUNTER — Other Ambulatory Visit: Payer: Self-pay

## 2019-05-24 MED ORDER — ALPRAZOLAM 1 MG PO TABS: ORAL_TABLET | ORAL | 0 refills | Status: DC

## 2019-05-25 ENCOUNTER — Encounter: Payer: Self-pay | Admitting: Gynecology

## 2019-05-31 ENCOUNTER — Encounter: Payer: Self-pay | Admitting: Adult Health

## 2019-06-02 NOTE — Progress Notes (Deleted)
Complete Physical  Assessment and Plan:  Encounter for general adult medical examination with abnormal findings  Morbid obesity (BMI ***) Long discussion about weight loss, diet, and exercise Recommended diet heavy in fruits and veggies and low in animal meats, cheeses, and dairy products, appropriate calorie intake Patient will work on diet via weight watchers Discussed appropriate weight for height  Follow up at next visit -     TSH  Other abnormal glucose Discussed general issues about diabetes pathophysiology and management., Educational material distributed., Suggested low cholesterol diet., Encouraged aerobic exercise., Discussed foot care., Reminded to get yearly retinal exam. -     TSH -     Hemoglobin A1c -     Urinalysis, Routine w reflex microscopic (not at [facility]) -     Microalbumin / creatinine urine ratio  Other migraine without status migrainosus, not intractable Managed by neurology Improved with amitriptylene 20 mg daily, imitrex/maxalt  Kidney stones Increase fluids  Anxiety Zoloft, trazodone *** Stress management techniques discussed, increase water, good sleep hygiene discussed, increase exercise, and increase veggies.  -     sertraline (ZOLOFT) 100 MG tablet; Take 2 tablet (100 mg total) by mouth daily.  Insomnia, unspecified type Continue medications  Recurrent major depressive disorder, in partial remission (HCC) -     lisdexamfetamine (VYVANSE) 60 MG capsule; Take 1 capsule (60 mg total) by mouth every morning. -     sertraline (ZOLOFT) 100 MG tablet; Take 2 tablet (100 mg total) by mouth daily.  Vitamin D deficiency -     VITAMIN D 25 Hydroxy (Vit-D Deficiency, Fractures)  Medication management -     CBC with Differential/Platelet -     CMP/GFR -     Magnesium  Attention deficit disorder, unspecified hyperactivity presence -     lisdexamfetamine (VYVANSE) 60 MG capsule; Take 1 capsule (60 mg total) by mouth every morning.  Hyperlipidemia,  unspecified hyperlipidemia type Currently managed by lifestyle; if persists LDL 130+ initiate medication Continue low cholesterol diet and exercise.  Check lipid panel.  -     Lipid panel -     TSH  Screening cardiovascular condition       -     EKG 12-Lead  Discussed med's effects and SE's. Screening labs and tests as requested with regular follow-up as recommended. Over 40 minutes of exam, counseling, chart review, and complex, high level critical decision making was performed this visit.   Future Appointments  Date Time Provider Department Center  06/07/2019  9:15 AM Judd Gaudierorbett, Kailin Leu, NP [facility] None  06/06/2020  9:00 AM Judd Gaudierorbett, Niani Mourer, NP [facility] None     HPI  48 y.o. 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   She works at [employer]***  *** insurance   Patient is on an ADD medication, she states that the medication is helping and she denies any adverse reactions. She is doing very well on the vyvanse, very good focus without physical symptoms however she would have to take a second one around 1-2 pm.   She has anxiety/depression controlled with zoloft, on trazodone at night, has PRN xanax; was using 2 tabs daily ***  reports stress levels are very high at work, has a foreclosure situation at home, ex and legal concerns, etc.  She does report she can sleep through the night.    She has dx of migraines, associated with stress and menstrual cycle; was referred to Dr. Karel Jarvis for further management due to poor control and numerous missed work days.  Currently having *** She is on amitriptyline  20 mg daily for prophylaxis, takes imitrex or maxalt as abortive.   BMI is There is no height or weight on  file to calculate BMI., she has been working on diet (has started [employer]), exercise is limited.  Wt Readings from Last 3 Encounters:  10/23/18 283 lb (128.4 kg)  10/21/18 283 lb 9.6 oz (128.6 kg)  07/28/18 274 lb (124.3 kg)   Her blood pressure has been controlled at home, today their BP is   She does not workout. She denies chest pain, shortness of breath, dizziness.   She is not on cholesterol medication and denies myalgias. Her cholesterol is not at goal. The cholesterol last visit was:   Lab Results  Component Value Date   CHOL 210 (H) 02/24/2018   HDL 40 (L) 02/24/2018   LDLCALC 139 (H) 02/24/2018   TRIG 174 (H) 02/24/2018   CHOLHDL 5.3 (H) 02/24/2018   She has been working on diet and exercise for hx of prediabetes (5.7% in 2016), and denies hypoglycemia , paresthesia of the feet, polydipsia, polyuria and visual disturbances. Last A1C in the office was:  Lab Results  Component Value Date   HGBA1C 5.1 02/24/2018   Lab Results  Component Value Date   GFRNONAA 83 02/24/2018   Patient is *** on Vitamin D supplement.   Lab Results  Component Value Date   VD25OH 28 (L) 02/24/2018       Current Medications:  Current Outpatient Medications on File Prior to Visit  Medication Sig Dispense Refill  . ALPRAZolam (XANAX) 1 MG tablet Take 1/2-1 tablet 1 or 2 x /day ONLY if needed for Anxiety Attack &  limit to 5 days /week to avoid addiction last 6 weeks - Nov 2nd 60 tablet 0  . amphetamine-dextroamphetamine (ADDERALL) 20 MG tablet Take 1 tablet Daily Only on workdays & Rx should last 6 weeks - Sept 21st 30 tablet 0  . benzonatate (TESSALON) 200 MG capsule Take 1 capsule (200 mg total) by mouth every 8 (eight) hours as needed for cough. 40 capsule 5  . doxycycline (VIBRA-TABS) 100 MG tablet Take 1 tablet (100 mg total) by mouth 2 (two) times daily. 14 tablet 0  . HYDROcodone-homatropine (HYCODAN) 5-1.5 MG/5ML syrup Take 5 cc at night for severe cough as needed. Do NOT take  with alcohol or xanax. Do not share, this is controlled substance. 120 mL 0  . nortriptyline (PAMELOR) 10 MG capsule Take 1 capsule every night for 1 week, then increase to 2 capsules every night 60 capsule 5  . predniSONE (DELTASONE) 20 MG tablet 2 tablets daily for 3 days, 1 tablet daily for 4 days. 10 tablet 0  . promethazine-dextromethorphan (PROMETHAZINE-DM) 6.25-15 MG/5ML syrup Take 5 mLs by mouth 4 (four) times daily as needed for cough. 240 mL 1  . rizatriptan (MAXALT) 10 MG tablet Take 1 tablet as needed for migraine. Do not take more than 2-3 a week 10 tablet 11  . sertraline (ZOLOFT) 100 MG tablet TAKE 2 TABLETS BY MOUTH DAILY FOR MOOD. 90 tablet 1  . SUMAtriptan (IMITREX) 100 MG tablet TAKE 1 TABLET BY MOUTH AS NEEDED FOR MIGRAINE, MAY REPEAT IN 2 HOURS IF HEADACHE PERSISTS OR RECURS 10 tablet  2  . traZODone (DESYREL) 150 MG tablet Take 1 tablet (150 mg total) by mouth at bedtime. 90 tablet 1   No current facility-administered medications on file prior to visit.    Allergies:  Allergies  Allergen Reactions  . Sulfa Antibiotics Itching  . Amoxicillin Rash  . 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   Health Maintenance:   Immunization History  Administered Date(s) Administered  . Influenza,inj,Quad PF,6+ Mos 06/04/2016  . Influenza-Unspecified 05/29/2017, 06/09/2018  . Pneumococcal-Unspecified 09/02/1996  . Td 10/05/2002   Tetanus:2016 Pneumovax: 1998 Prevnar 13:  Flu vaccine: 2019 *** Zostavax:  No LMP recorded. Pap: 10/2015 Dr. Audie Box *** MGM: 07/2016 DUE will get at Dr. Audie Box *** DEXA: N/A  Colonoscopy: due age 18  EGD: N/A  CXR 2016  Last eye exam: Last dental exam: Last derm exam:    Patient Care Team: Lucky Cowboy, MD as PCP - General (Internal Medicine) Quentin Mulling, PA-C (Physician Assistant)  Surgical History:  She has a past surgical history that includes Cesarean section; Pelvic laparoscopy (2004); Tonsillectomy (2011); and Wisdom tooth extraction (1990). Family History:  Herfamily history includes ADD / ADHD in her daughter and son; Alcohol abuse in her father; Alzheimer's disease in her maternal aunt, maternal grandfather, and maternal grandmother; Anxiety disorder in her mother; Autism in her son; Celiac disease in her sister; Depression in her father and mother; Heart attack in her paternal grandmother; Hypertension in her mother; Migraines in her mother; Stroke in her paternal grandfather. Social History:  She reports that she quit smoking about 16 years ago. She has never used smokeless tobacco. She reports current alcohol use of about 14.0 standard drinks of alcohol per week. She reports that she does not use drugs.  Review of Systems: Review of Systems  Constitutional: Negative.  Negative for malaise/fatigue and weight loss.  HENT: Negative.  Negative for hearing loss and tinnitus.   Eyes: Negative.  Negative for blurred vision and double vision.  Respiratory: Negative.  Negative for cough, sputum production, shortness of breath and wheezing.   Cardiovascular: Negative.  Negative for chest pain, palpitations, orthopnea, claudication, leg swelling and PND.  Gastrointestinal: Negative.  Negative for abdominal pain, blood in stool, constipation, diarrhea, heartburn, melena, nausea and vomiting.  Genitourinary: Negative.   Musculoskeletal: Negative.  Negative for falls, joint pain and myalgias.  Skin: Negative.  Negative for rash.  Neurological: Positive for headaches. Negative for dizziness, tingling, sensory change and weakness.  Endo/Heme/Allergies: Negative.  Negative for polydipsia.  Psychiatric/Behavioral: Positive for depression. Negative for  memory loss, substance abuse and suicidal ideas. The patient is nervous/anxious and has insomnia.   All other systems reviewed and are negative.   Physical Exam: Estimated body mass index is 48.58 kg/m as calculated from the following:   Height as of 10/23/18: 5\' 4"  (1.626 m).   Weight as of 10/23/18: 283 lb (128.4 kg). There were no vitals taken for this visit. General Appearance: Well nourished, in no apparent distress.  Eyes: PERRLA, EOMs, conjunctiva no swelling or erythema, normal fundi and vessels.  Sinuses: No Frontal/maxillary tenderness  ENT/Mouth: Ext aud canals clear, normal light reflex with TMs without erythema, bulging. Good dentition. No erythema, swelling, or exudate on post pharynx. Tonsils not swollen or erythematous. Hearing normal.  Neck: Supple,  thyroid normal. No bruits  Respiratory: Respiratory effort normal, BS equal bilaterally without rales, rhonchi, wheezing or stridor.  Cardio: RRR without murmurs, rubs or gallops. Brisk peripheral pulses without edema.  Chest: symmetric, with normal excursions and percussion.  Breasts: Defer to GYN Abdomen: Soft, nontender, no guarding, rebound, hernias, masses, or organomegaly.  Lymphatics: Non tender without lymphadenopathy.  Genitourinary: Defer to GYN Musculoskeletal: Full ROM all peripheral extremities,5/5 strength, and normal gait.  Skin: Warm, dry without rashes, lesions, ecchymosis. Neuro: Cranial nerves intact, reflexes equal bilaterally. Normal muscle tone, no cerebellar symptoms. Sensation intact.  Psych: Awake and oriented X 3, normal affect, Insight and Judgment appropriate.   EKG: WNL no ST changes. *** AORTA SCAN: defer  Izora Ribas 4:33 PM [facility]

## 2019-06-07 ENCOUNTER — Encounter: Payer: Self-pay | Admitting: Adult Health

## 2019-06-07 ENCOUNTER — Other Ambulatory Visit: Payer: Self-pay

## 2019-06-07 DIAGNOSIS — Z0000 Encounter for general adult medical examination without abnormal findings: Secondary | ICD-10-CM

## 2019-06-07 MED ORDER — AMPHETAMINE-DEXTROAMPHETAMINE 20 MG PO TABS: ORAL_TABLET | ORAL | 0 refills | Status: DC

## 2019-06-14 ENCOUNTER — Encounter: Payer: Self-pay | Admitting: Adult Health

## 2019-07-11 ENCOUNTER — Other Ambulatory Visit: Payer: Self-pay

## 2019-07-12 NOTE — Progress Notes (Deleted)
Complete Physical  Assessment and Plan:  Encounter for general adult medical examination with abnormal findings  Morbid obesity (BMI ***) Long discussion about weight loss, diet, and exercise Recommended diet heavy in fruits and veggies and low in animal meats, cheeses, and dairy products, appropriate calorie intake Patient will work on diet via weight watchers Discussed appropriate weight for height  Follow up at next visit -     TSH  Other abnormal glucose Discussed general issues about diabetes pathophysiology and management., Educational material distributed., Suggested low cholesterol diet., Encouraged aerobic exercise., Discussed foot care., Reminded to get yearly retinal exam. -     TSH -     Hemoglobin A1c -     Urinalysis, Routine w reflex microscopic (not at [facility]) -     EKG 12-Lead  Other migraine without status migrainosus, not intractable ***   Kidney stones Increase fluids  Attention deficit disorder, unspecified hyperactivity presence *** -     lisdexamfetamine (VYVANSE) 60 MG capsule; Take 1 capsule (60 mg total) by mouth every morning.  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  Insomnia, unspecified type Continue medications  Vitamin D deficiency *** -     VITAMIN D 25 Hydroxy (Vit-D Deficiency, Fractures)  Medication management -     CBC with Differential/Platelet -     CMP/GFR -     Magnesium  Hyperlipidemia, unspecified hyperlipidemia type LDL goal <100; initiate med if persists 130+ Continue low cholesterol diet and exercise.  Check lipid panel.  -     Lipid panel   Discussed med's effects and SE's. Screening labs and tests as requested with regular follow-up as recommended. Over 40 minutes of exam, counseling, chart review, and complex, high level critical decision making was performed this visit.   Future Appointments  Date Time  Provider Department Center  07/14/2019  2:00 PM Judd Gaudier, NP [facility] None     HPI  48 y.o. 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 She is doing very well on the vyvanse, very good focus without physical symptoms however she would have to take a second one around 1-2 pm.   She has anxiety/depression controlled with zoloft, xanax 1 at lunch, 1 at night and on trazodone at night, ***   She has dx of migraines, was referred to Dr. Karel Jarvis due to numerous missed work days and was initiated on amitriptyline 10-20 mg, has maxalt and imitrex as abortives ***   BMI is There is no height or weight on file to calculate BMI., she has been working on diet (has started [employer]), exercise is limited.  Wt Readings from Last 3 Encounters:  10/23/18 283 lb (128.4 kg)  10/21/18 283 lb 9.6 oz (128.6 kg)  07/28/18 274 lb (124.3 kg)   Her blood pressure has been controlled at home, today their BP is   She does not workout. She denies chest pain, shortness of  breath, dizziness.   She is not on cholesterol medication and denies myalgias. Her cholesterol is not at goal. The cholesterol last visit was:   Lab Results  Component Value Date   CHOL 210 (H) 02/24/2018   HDL 40 (L) 02/24/2018   LDLCALC 139 (H) 02/24/2018   TRIG 174 (H) 02/24/2018   CHOLHDL 5.3 (H) 02/24/2018   She has been working on diet and exercise for glucose management, and denies hypoglycemia , paresthesia of the feet,  polydipsia, polyuria and visual disturbances. Last A1C in the office was:  Lab Results  Component Value Date   HGBA1C 5.1 02/24/2018   Lab Results  Component Value Date   GFRNONAA 83 02/24/2018   Patient is on Vitamin D supplement.   Lab Results  Component Value Date   VD25OH 28 (L) 02/24/2018       Current Medications:  Current Outpatient Medications on File Prior to Visit  Medication Sig Dispense Refill  . ALPRAZolam (XANAX) 1 MG tablet Take 1/2-1 tablet 1 or 2 x /day ONLY if needed for Anxiety Attack &  limit to 5 days /week to avoid addiction last 6 weeks - Nov 2nd 60 tablet 0  . amphetamine-dextroamphetamine (ADDERALL) 20 MG tablet Take 1 tablet Daily Only on workdays & Rx should last 6 weeks 30 tablet 0  . benzonatate (TESSALON) 200 MG capsule Take 1 capsule (200 mg total) by mouth every 8 (eight) hours as needed for cough. 40 capsule 5  . doxycycline (VIBRA-TABS) 100 MG tablet Take 1 tablet (100 mg total) by mouth 2 (two) times daily. 14 tablet 0  . HYDROcodone-homatropine (HYCODAN) 5-1.5 MG/5ML syrup Take 5 cc at night for severe cough as needed. Do NOT take with alcohol or xanax. Do not share, this is controlled substance. 120 mL 0  . nortriptyline (PAMELOR) 10 MG capsule Take 1 capsule every night for 1 week, then increase to 2 capsules every night 60 capsule 5  . predniSONE (DELTASONE) 20 MG tablet 2 tablets daily for 3 days, 1 tablet daily for 4 days. 10 tablet 0  . promethazine-dextromethorphan (PROMETHAZINE-DM) 6.25-15 MG/5ML syrup Take 5 mLs by mouth 4 (four) times daily as needed for cough. 240 mL 1  . rizatriptan (MAXALT) 10 MG tablet Take 1 tablet as needed for migraine. Do not take more than 2-3 a week 10 tablet 11  . sertraline (ZOLOFT) 100 MG tablet TAKE 2 TABLETS BY MOUTH DAILY FOR MOOD. 90 tablet 1  . SUMAtriptan (IMITREX) 100 MG tablet TAKE 1 TABLET BY MOUTH AS NEEDED FOR MIGRAINE, MAY REPEAT IN 2 HOURS IF HEADACHE PERSISTS OR RECURS 10 tablet 2  . traZODone  (DESYREL) 150 MG tablet Take 1 tablet (150 mg total) by mouth at bedtime. 90 tablet 1   No current facility-administered medications on file prior to visit.    Allergies:  Allergies  Allergen Reactions  . Sulfa Antibiotics Itching  . Amoxicillin Rash  . Penicillins Rash   Medical History:  She has Migraines; Kidney stones; Insomnia; Recurrent mild major depressive disorder with anxiety ([place]); Morbid obesity with BMI of 45.0-49.9, adult ([place]); Other abnormal glucose; Vitamin D deficiency; ADD (attention deficit disorder); and Hyperlipidemia on their problem list.   Health Maintenance:   Immunization History  Administered Date(s) Administered  . Influenza,inj,Quad PF,6+ Mos 06/04/2016  . Influenza-Unspecified 05/29/2017, 06/09/2018  . Pneumococcal-Unspecified 09/02/1996  . Td 10/05/2002   Tetanus:2016 Pneumovax: 1998 Prevnar 13:  Flu vaccine: 2018  Zostavax:  No LMP recorded. Pap: 10/2015 Dr. Phineas Real  MGM: 07/2016 DUE will get at Dr. Audie BoxFontaine DEXA: N/A  Colonoscopy: Due age 48 EGD: N/A  CXR 2016  Last dental:  Last vision:   Patient Care Team: Lucky CowboyMcKeown, William, MD as PCP - General (Internal Medicine) Quentin Mullingollier, Amanda, PA-C (Physician Assistant)  Surgical History:  She has a past surgical history that includes Cesarean section; Pelvic laparoscopy (2004); Tonsillectomy (2011); and Wisdom tooth extraction (1990). Family History:  Herfamily history includes ADD / ADHD in her daughter and son; Alcohol abuse in her father; Alzheimer's disease in her maternal aunt, maternal grandfather, and maternal grandmother; Anxiety disorder in her mother; Autism in her son; Celiac disease in her sister; Depression in her father and mother; Heart attack in her paternal grandmother; Hypertension in her mother; Migraines in her mother; Stroke in her paternal grandfather. Social History:  She reports that she quit smoking about 16 years ago. She has never used smokeless tobacco. She reports  current alcohol use of about 14.0 standard drinks of alcohol per week. She reports that she does not use drugs.  Review of Systems: Review of Systems  Constitutional: Negative.  Negative for malaise/fatigue and weight loss.  HENT: Negative.  Negative for hearing loss and tinnitus.   Eyes: Negative.  Negative for blurred vision and double vision.  Respiratory: Negative.  Negative for cough, sputum production, shortness of breath and wheezing.   Cardiovascular: Negative.  Negative for chest pain, palpitations, orthopnea, claudication, leg swelling and PND.  Gastrointestinal: Negative.  Negative for abdominal pain, blood in stool, constipation, diarrhea, heartburn, melena, nausea and vomiting.  Genitourinary: Negative.   Musculoskeletal: Negative.  Negative for falls, joint pain and myalgias.  Skin: Negative.  Negative for rash.  Neurological: Positive for headaches. Negative for dizziness, tingling, sensory change and weakness.  Endo/Heme/Allergies: Negative.  Negative for polydipsia.  Psychiatric/Behavioral: Positive for depression. Negative for memory loss, substance abuse and suicidal ideas. The patient is nervous/anxious and has insomnia.   All other systems reviewed and are negative.   Physical Exam: Estimated body mass index is 48.58 kg/m as calculated from the following:   Height as of 10/23/18: 5\' 4"  (1.626 m).   Weight as of 10/23/18: 283 lb (128.4 kg). There were no vitals taken for this visit. General Appearance: Well nourished, in no apparent distress.  Eyes: PERRLA, EOMs, conjunctiva no swelling or erythema, normal fundi and vessels.  Sinuses: No Frontal/maxillary tenderness  ENT/Mouth: Ext aud canals clear, normal light reflex with TMs without erythema, bulging. Good dentition. No erythema, swelling, or exudate on post pharynx. Tonsils not swollen or erythematous. Hearing normal.  Neck: Supple, thyroid normal. No bruits  Respiratory: Respiratory effort normal, BS equal  bilaterally without rales, rhonchi, wheezing or stridor.  Cardio: RRR without murmurs, rubs or gallops. Brisk peripheral pulses without edema.  Chest: symmetric, with normal excursions and percussion.  Breasts: Defer to GYN Abdomen: Soft, nontender, no guarding, rebound, hernias, masses, or organomegaly.  Lymphatics: Non tender without lymphadenopathy.  Genitourinary: Defer to GYN Musculoskeletal: Full ROM all peripheral extremities,5/5 strength, and normal gait.  Skin: Warm, dry without rashes, lesions, ecchymosis. Neuro: Cranial nerves intact, reflexes equal bilaterally. Normal muscle tone, no cerebellar symptoms. Sensation intact.  Psych: Awake and oriented X 3, normal affect, Insight and Judgment appropriate.   EKG: WNL no ST changes. AORTA SCAN: defer  Dan Makershley C Victorious Cosio 2:35 PM [facility]

## 2019-07-14 ENCOUNTER — Encounter: Payer: Self-pay | Admitting: Adult Health

## 2019-07-14 ENCOUNTER — Other Ambulatory Visit: Payer: Self-pay | Admitting: Adult Health

## 2019-07-14 MED ORDER — ALPRAZOLAM 1 MG PO TABS: ORAL_TABLET | ORAL | 0 refills | Status: DC

## 2019-07-27 ENCOUNTER — Other Ambulatory Visit: Payer: Self-pay

## 2019-07-27 MED ORDER — AMPHETAMINE-DEXTROAMPHETAMINE 20 MG PO TABS: ORAL_TABLET | ORAL | 0 refills | Status: DC

## 2019-08-03 NOTE — Progress Notes (Deleted)
Complete Physical  Assessment and Plan:  Encounter for general adult medical examination with abnormal findings  Morbid obesity (BMI ***) Long discussion about weight loss, diet, and exercise Recommended diet heavy in fruits and veggies and low in animal meats, cheeses, and dairy products, appropriate calorie intake Patient will work on diet via weight watchers Discussed appropriate weight for height  Follow up at next visit -     TSH  Other abnormal glucose Discussed general issues about diabetes pathophysiology and management., Educational material distributed., Suggested low cholesterol diet., Encouraged aerobic exercise., Discussed foot care., Reminded to get yearly retinal exam. -     TSH -     Hemoglobin A1c -     Urinalysis, Routine w reflex microscopic (not at [facility]) -     EKG 12-Lead  Other migraine without status migrainosus, not intractable ***   Kidney stones Increase fluids  Attention deficit disorder, unspecified hyperactivity presence *** -     lisdexamfetamine (VYVANSE) 60 MG capsule; Take 1 capsule (60 mg total) by mouth every morning.  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  Insomnia, unspecified type Continue medications  Vitamin D deficiency *** -     VITAMIN D 25 Hydroxy (Vit-D Deficiency, Fractures)  Medication management -     CBC with Differential/Platelet -     CMP/GFR -     Magnesium  Hyperlipidemia, unspecified hyperlipidemia type LDL goal <100; initiate med if persists 130+ Continue low cholesterol diet and exercise.  Check lipid panel.  -     Lipid panel   Discussed med's effects and SE's. Screening labs and tests as requested with regular follow-up as recommended. Over 40 minutes of exam, counseling, chart review, and complex, high level critical decision making was performed this visit.   Future Appointments  Date Time  Provider [place]  08/04/2019  3:00 PM Liane Comber, NP [facility] None  08/03/2020  3:00 PM Liane Comber, NP [facility] None     HPI  48 y.o. female  presents for a complete physical and follow up for has Migraines; Kidney stones; Insomnia; Recurrent mild major depressive disorder with anxiety ([place]); Morbid obesity with BMI of 45.0-49.9, adult ([place]); Other abnormal glucose; Vitamin D deficiency; ADD (attention deficit disorder); and Hyperlipidemia on their problem list.   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 She is doing very well on the vyvanse, very good focus without physical symptoms however she would have to take a second one around 1-2 pm.   She has anxiety/depression controlled with zoloft, xanax 1 at lunch, 1 at night and on trazodone at night, ***   She has dx of migraines, was referred to Dr. Delice Lesch due to numerous missed work days and was initiated on amitriptyline 10-20 mg, has maxalt and imitrex as abortives ***   BMI is There is no height or weight on file to calculate BMI., she has been working on diet (has started [employer]), exercise is limited.  Wt Readings from Last 3 Encounters:  10/23/18 283 lb (128.4 kg)  10/21/18 283 lb 9.6 oz (128.6 kg)  07/28/18 274 lb (124.3 kg)   Her blood pressure has been controlled at home, today their BP is  She does not workout. She denies chest pain, shortness of breath, dizziness.   She is not on cholesterol medication and denies myalgias. Her cholesterol is not at goal. The cholesterol last visit was:   Lab Results  Component Value Date   CHOL 210 (H) 02/24/2018   HDL 40 (L) 02/24/2018   LDLCALC 139 (H) 02/24/2018   TRIG 174 (H) 02/24/2018   CHOLHDL 5.3 (H) 02/24/2018   She has been working on diet and exercise for glucose  management, and denies hypoglycemia , paresthesia of the feet, polydipsia, polyuria and visual disturbances. Last A1C in the office was:  Lab Results  Component Value Date   HGBA1C 5.1 02/24/2018   Lab Results  Component Value Date   GFRNONAA 83 02/24/2018   Patient is on Vitamin D supplement.   Lab Results  Component Value Date   VD25OH 28 (L) 02/24/2018       Current Medications:  Current Outpatient Medications on File Prior to Visit  Medication Sig Dispense Refill  . ALPRAZolam (XANAX) 1 MG tablet Take 1/2-1 tablet 1 or 2 x /day ONLY if needed for Anxiety Attack &  limit to 5 days /week to avoid addiction. Should last 6 weeks - 08/25/2019 60 tablet 0  . amphetamine-dextroamphetamine (ADDERALL) 20 MG tablet Take 1 tablet Daily Only on workdays & Rx should last 6 weeks 30 tablet 0  . benzonatate (TESSALON) 200 MG capsule Take 1 capsule (200 mg total) by mouth every 8 (eight) hours as needed for cough. 40 capsule 5  . doxycycline (VIBRA-TABS) 100 MG tablet Take 1 tablet (100 mg total) by mouth 2 (two) times daily. 14 tablet 0  . HYDROcodone-homatropine (HYCODAN) 5-1.5 MG/5ML syrup Take 5 cc at night for severe cough as needed. Do NOT take with alcohol or xanax. Do not share, this is controlled substance. 120 mL 0  . nortriptyline (PAMELOR) 10 MG capsule Take 1 capsule every night for 1 week, then increase to 2 capsules every night 60 capsule 5  . predniSONE (DELTASONE) 20 MG tablet 2 tablets daily for 3 days, 1 tablet daily for 4 days. 10 tablet 0  . promethazine-dextromethorphan (PROMETHAZINE-DM) 6.25-15 MG/5ML syrup Take 5 mLs by mouth 4 (four) times daily as needed for cough. 240 mL 1  . rizatriptan (MAXALT) 10 MG tablet Take 1 tablet as needed for migraine. Do not take more than 2-3 a week 10 tablet 11  . sertraline (ZOLOFT) 100 MG tablet TAKE 2 TABLETS BY MOUTH DAILY FOR MOOD. 90 tablet 1  . SUMAtriptan (IMITREX) 100 MG tablet TAKE 1 TABLET BY MOUTH AS NEEDED FOR MIGRAINE, MAY  REPEAT IN 2 HOURS IF HEADACHE PERSISTS OR RECURS 10 tablet 2  . traZODone (DESYREL) 150 MG tablet Take 1 tablet (150 mg total) by mouth at bedtime. 90 tablet 1   No current facility-administered medications on file prior to visit.    Allergies:  Allergies  Allergen Reactions  . Sulfa Antibiotics Itching  . Amoxicillin Rash  . 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   Health Maintenance:   Immunization History  Administered Date(s) Administered  . Influenza,inj,Quad PF,6+ Mos 06/04/2016  . Influenza-Unspecified 05/29/2017, 06/09/2018  . Pneumococcal-Unspecified 09/02/1996  . Td 10/05/2002   Tetanus:2016 Pneumovax: 1998 Prevnar 13:  Flu vaccine: 2018  Zostavax:  No LMP recorded. Pap: 10/2015 Dr. Audie BoxFontaine MGM: 07/2016 DUE will get at Dr. Audie BoxFontaine DEXA: N/A  Colonoscopy: Due age 48 EGD: N/A  CXR 2016  Last dental:  Last vision:   Patient Care Team: Lucky CowboyMcKeown, William, MD as PCP - General (Internal Medicine) Quentin Mullingollier, Amanda, PA-C (Physician Assistant)  Surgical History:  She has a past surgical history that includes Cesarean section; Pelvic laparoscopy (2004); Tonsillectomy (2011); and Wisdom tooth extraction (1990). Family History:  Herfamily history includes ADD / ADHD in her daughter and son; Alcohol abuse in her father; Alzheimer's disease in her maternal aunt, maternal grandfather, and maternal grandmother; Anxiety disorder in her mother; Autism in her son; Celiac disease in her sister; Depression in her father and mother; Heart attack in her paternal grandmother; Hypertension in her mother; Migraines in her mother; Stroke in her paternal grandfather. Social History:  She reports that she quit  smoking about 16 years ago. She has never used smokeless tobacco. She reports current alcohol use of about 14.0 standard drinks of alcohol per week. She reports that she does not use drugs.  Review of Systems: Review of Systems  Constitutional: Negative.  Negative for malaise/fatigue and weight loss.  HENT: Negative.  Negative for hearing loss and tinnitus.   Eyes: Negative.  Negative for blurred vision and double vision.  Respiratory: Negative.  Negative for cough, sputum production, shortness of breath and wheezing.   Cardiovascular: Negative.  Negative for chest pain, palpitations, orthopnea, claudication, leg swelling and PND.  Gastrointestinal: Negative.  Negative for abdominal pain, blood in stool, constipation, diarrhea, heartburn, melena, nausea and vomiting.  Genitourinary: Negative.   Musculoskeletal: Negative.  Negative for falls, joint pain and myalgias.  Skin: Negative.  Negative for rash.  Neurological: Positive for headaches. Negative for dizziness, tingling, sensory change and weakness.  Endo/Heme/Allergies: Negative.  Negative for polydipsia.  Psychiatric/Behavioral: Positive for depression. Negative for memory loss, substance abuse and suicidal ideas. The patient is nervous/anxious and has insomnia.   All other systems reviewed and are negative.   Physical Exam: Estimated body mass index is 48.58 kg/m as calculated from the following:   Height as of 10/23/18: 5\' 4"  (1.626 m).   Weight as of 10/23/18: 283 lb (128.4 kg). There were no vitals taken for this visit. General Appearance: Well nourished, in no apparent distress.  Eyes: PERRLA, EOMs, conjunctiva no swelling or erythema, normal fundi and vessels.  Sinuses: No Frontal/maxillary tenderness  ENT/Mouth: Ext aud canals clear, normal light reflex with TMs without erythema, bulging. Good dentition. No erythema, swelling, or exudate on post pharynx. Tonsils not swollen or erythematous. Hearing normal.  Neck: Supple,  thyroid normal. No bruits  Respiratory: Respiratory effort normal, BS equal bilaterally without rales, rhonchi, wheezing or stridor.  Cardio: RRR without murmurs, rubs or gallops. Brisk peripheral pulses without edema.  Chest: symmetric, with normal excursions and percussion.  Breasts: Defer to GYN Abdomen: Soft, nontender, no guarding, rebound, hernias, masses, or organomegaly.  Lymphatics: Non tender without lymphadenopathy.  Genitourinary: Defer to GYN Musculoskeletal: Full ROM all peripheral extremities,5/5 strength, and normal gait.  Skin: Warm, dry without rashes, lesions, ecchymosis. Neuro: Cranial nerves intact, reflexes equal bilaterally. Normal muscle tone, no cerebellar symptoms. Sensation intact.  Psych: Awake and oriented X 3, normal affect, Insight and Judgment appropriate.   EKG: WNL no ST changes. AORTA SCAN: defer  Dan Makershley C Jamaris Theard 1:23 PM [facility]

## 2019-08-04 ENCOUNTER — Encounter: Payer: Self-pay | Admitting: Adult Health

## 2019-08-14 ENCOUNTER — Other Ambulatory Visit: Payer: Self-pay | Admitting: Adult Health

## 2019-08-24 ENCOUNTER — Other Ambulatory Visit: Payer: Self-pay

## 2019-08-25 ENCOUNTER — Other Ambulatory Visit: Payer: Self-pay

## 2019-08-25 MED ORDER — ALPRAZOLAM 1 MG PO TABS: ORAL_TABLET | ORAL | 0 refills | Status: DC

## 2019-09-15 ENCOUNTER — Encounter: Payer: Self-pay | Admitting: Adult Health

## 2019-10-14 ENCOUNTER — Other Ambulatory Visit: Payer: Self-pay

## 2019-10-16 ENCOUNTER — Other Ambulatory Visit: Payer: Self-pay | Admitting: Adult Health

## 2019-10-16 MED ORDER — AMPHETAMINE-DEXTROAMPHETAMINE 20 MG PO TABS: ORAL_TABLET | ORAL | 0 refills | Status: DC

## 2019-10-16 MED ORDER — ALPRAZOLAM 1 MG PO TABS: ORAL_TABLET | ORAL | 0 refills | Status: DC

## 2019-11-03 ENCOUNTER — Encounter: Payer: Self-pay | Admitting: Adult Health

## 2019-11-29 DIAGNOSIS — Z87891 Personal history of nicotine dependence: Secondary | ICD-10-CM | POA: Insufficient documentation

## 2019-11-29 NOTE — Progress Notes (Deleted)
Complete Physical  Assessment and Plan:  Encounter for general adult medical examination with abnormal findings  Morbid obesity (BMI 40.37) Long discussion about weight loss, diet, and exercise Recommended diet heavy in fruits and veggies and low in animal meats, cheeses, and dairy products, appropriate calorie intake Patient will work on diet via weight watchers Discussed appropriate weight for height  Follow up at next visit -     TSH  Other abnormal glucose Discussed general issues about diabetes pathophysiology and management., Educational material distributed., Suggested low cholesterol diet., Encouraged aerobic exercise., Discussed foot care., Reminded to get yearly retinal exam. -     TSH -     Hemoglobin A1c -     Urinalysis, Routine w reflex microscopic (not at [facility]) -     Microalbumin / creatinine urine ratio -     EKG 12-Lead  Other migraine without status migrainosus, not intractable  continue nadolol for now, ? Hx of good prophylaxis with topamax, will try this with nadolol and if doing well can try to taper off BB Continue imitrex Refer to neuro if not improving to discuss novel agents Follow up 3 months  Kidney stones Increase fluids  Anxiety Continue zoloft **** Stress management techniques discussed, increase water, good sleep hygiene discussed, increase exercise, and increase veggies.  -     sertraline (ZOLOFT) 100 MG tablet; Take 2 tablet (100 mg total) by mouth daily.  Insomnia, unspecified type Continue medications  Recurrent major depressive disorder, in partial remission (HCC) *** -     lisdexamfetamine (VYVANSE) 60 MG capsule; Take 1 capsule (60 mg total) by mouth every morning. -     sertraline (ZOLOFT) 100 MG tablet; Take 2 tablet (100 mg total) by mouth daily.  Vitamin D deficiency -     VITAMIN D 25 Hydroxy (Vit-D Deficiency, Fractures)  Medication management -     CBC with Differential/Platelet -     CMP/GFR -     Magnesium  Attention  deficit disorder, unspecified hyperactivity presence -     lisdexamfetamine (VYVANSE) 60 MG capsule; Take 1 capsule (60 mg total) by mouth every morning.  Hyperlipidemia, unspecified hyperlipidemia type -     Lipid panel -     EKG 12-Lead   Discussed med's effects and SE's. Screening labs and tests as requested with regular follow-up as recommended. Over 40 minutes of exam, counseling, chart review, and complex, high level critical decision making was performed this visit.   Future Appointments  Date Time Provider [place]  12/01/2019  3:00 PM Liane Comber, NP [facility] None  11/30/2020  3:00 PM Liane Comber, NP [facility] None     HPI  49 y.o. female  presents for a complete physical and follow up for has Migraines; Kidney stones; Insomnia; Recurrent mild major depressive disorder with anxiety ([place]); Morbid obesity with BMI of 45.0-49.9, adult ([place]); Other abnormal glucose; Vitamin D deficiency; ADD (attention deficit disorder); and Hyperlipidemia on their problem list.     Patient is on an ADD medication, she states that the medication is helping and she denies any adverse reactions.  She was doing very well on the vyvanse, but due to insurance/cost back on adderall   She has anxiety/depression controlled with zoloft, xanax 1 at lunch, 1 at night and on trazodone at night, reports stress levels are very high at work, has a foreclosure situation at home, ex and legal concerns, etc. She does report she can sleep through the night.    She has dx  of migraines, associated with stress and menstrual cycle.  Currently having 1-2 per month, significantly limits her and has to call out frequently r/t this, was referred to Dr. Karel Jarvis who initiated amitriptyline  Takes imitrex vs maxalt PRN abortive   BMI is There is no height or weight on file to calculate BMI., she has been working on diet (has started [employer]), exercise is limited.  Wt Readings from Last 3 Encounters:   10/23/18 283 lb (128.4 kg)  10/21/18 283 lb 9.6 oz (128.6 kg)  07/28/18 274 lb (124.3 kg)   She is a former smoker, 9 pack/year history, quit in 2004. Last CXR 11/2018 for bronchitis.   Her blood pressure has been controlled at home, today their BP is   She does not workout. She denies chest pain, shortness of breath, dizziness.   She is not on cholesterol medication and denies myalgias. Her cholesterol is not at goal. The cholesterol last visit was:   Lab Results  Component Value Date   CHOL 210 (H) 02/24/2018   HDL 40 (L) 02/24/2018   LDLCALC 139 (H) 02/24/2018   TRIG 174 (H) 02/24/2018   CHOLHDL 5.3 (H) 02/24/2018   She has been working on diet and exercise for hx of prediabetes (A1C 5.7% in 2016), and denies hypoglycemia , paresthesia of the feet, polydipsia, polyuria and visual disturbances. Last A1C in the office was:  Lab Results  Component Value Date   HGBA1C 5.1 02/24/2018    She is in CKD II range:  Lab Results  Component Value Date   [facility] 83 02/24/2018   Patient is *** on Vitamin D supplement.   Lab Results  Component Value Date   VD25OH 28 (L) 02/24/2018       Current Medications:  Current Outpatient Medications on File Prior to Visit  Medication Sig Dispense Refill  . ALPRAZolam (XANAX) 1 MG tablet Take 1/2-1 tablet 1 or 2 x /day ONLY if needed for Anxiety Attack &  limit to 5 days /week to avoid addiction. Should last 6 weeks - 08/25/2019 60 tablet 0  . amphetamine-dextroamphetamine (ADDERALL) 20 MG tablet Take 1 tablet Daily Only on workdays & Rx should last 6 weeks 30 tablet 0  . benzonatate (TESSALON) 200 MG capsule Take 1 capsule (200 mg total) by mouth every 8 (eight) hours as needed for cough. 40 capsule 5  . doxycycline (VIBRA-TABS) 100 MG tablet Take 1 tablet (100 mg total) by mouth 2 (two) times daily. 14 tablet 0  . HYDROcodone-homatropine (HYCODAN) 5-1.5 MG/5ML syrup Take 5 cc at night for severe cough as needed. Do NOT take with alcohol or  xanax. Do not share, this is controlled substance. 120 mL 0  . nortriptyline (PAMELOR) 10 MG capsule Take 1 capsule every night for 1 week, then increase to 2 capsules every night 60 capsule 5  . predniSONE (DELTASONE) 20 MG tablet 2 tablets daily for 3 days, 1 tablet daily for 4 days. 10 tablet 0  . promethazine-dextromethorphan (PROMETHAZINE-DM) 6.25-15 MG/5ML syrup Take 5 mLs by mouth 4 (four) times daily as needed for cough. 240 mL 1  . rizatriptan (MAXALT) 10 MG tablet Take 1 tablet as needed for migraine. Do not take more than 2-3 a week 10 tablet 11  . sertraline (ZOLOFT) 100 MG tablet TAKE 2 TABLETS BY MOUTH DAILY FOR MOOD. 90 tablet 1  . SUMAtriptan (IMITREX) 100 MG tablet TAKE 1 TABLET BY MOUTH AS NEEDED FOR MIGRAINE, MAY REPEAT IN 2 HOURS IF HEADACHE PERSISTS  OR RECURS 10 tablet 2  . traZODone (DESYREL) 150 MG tablet Take 1 tablet at Bedtime for Sleep 90 tablet 3   No current facility-administered medications on file prior to visit.   Allergies:  Allergies  Allergen Reactions  . Sulfa Antibiotics Itching  . Amoxicillin Rash  . 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   Health Maintenance:   Immunization History  Administered Date(s) Administered  . Influenza,inj,Quad PF,6+ Mos 06/04/2016  . Influenza-Unspecified 05/29/2017, 06/09/2018  . Pneumococcal-Unspecified 09/02/1996  . Td 10/05/2002   Tetanus:2016 Pneumovax: 1998 Prevnar 13:  Flu vaccine: 2018  Zostavax:  No LMP recorded. Pap: 10/2015 Dr. Audie Box MGM: 07/2016 DUE will get at Dr. Audie Box *** DEXA: N/A Colonoscopy: N/A EGD: N/A CXR 2016  Patient Care Team: Lucky Cowboy, MD as PCP - General (Internal Medicine) Quentin Mulling, PA-C  (Physician Assistant)  Surgical History:  She has a past surgical history that includes Cesarean section; Pelvic laparoscopy (2004); Tonsillectomy (2011); and Wisdom tooth extraction (1990). Family History:  Herfamily history includes ADD / ADHD in her daughter and son; Alcohol abuse in her father; Alzheimer's disease in her maternal aunt, maternal grandfather, and maternal grandmother; Anxiety disorder in her mother; Autism in her son; Celiac disease in her sister; Depression in her father and mother; Heart attack in her paternal grandmother; Hypertension in her mother; Migraines in her mother; Stroke in her paternal grandfather. Social History:  She reports that she quit smoking about 16 years ago. She has never used smokeless tobacco. She reports current alcohol use of about 14.0 standard drinks of alcohol per week. She reports that she does not use drugs.  Review of Systems: Review of Systems  Constitutional: Negative.  Negative for malaise/fatigue and weight loss.  HENT: Negative.  Negative for hearing loss and tinnitus.   Eyes: Negative.  Negative for blurred vision and double vision.  Respiratory: Negative.  Negative for cough, sputum production, shortness of breath and wheezing.   Cardiovascular: Negative.  Negative for chest pain, palpitations, orthopnea, claudication, leg swelling and PND.  Gastrointestinal: Negative.  Negative for abdominal pain, blood in stool, constipation, diarrhea, heartburn, melena, nausea and vomiting.  Genitourinary: Negative.   Musculoskeletal: Negative.  Negative for falls, joint pain and myalgias.  Skin: Negative.  Negative for rash.  Neurological: Positive for headaches. Negative for dizziness, tingling, sensory change and weakness.  Endo/Heme/Allergies: Negative.  Negative for polydipsia.  Psychiatric/Behavioral: Positive for depression. Negative for memory loss, substance abuse and suicidal ideas. The patient is nervous/anxious and has insomnia.   All  other systems reviewed and are negative.   Physical Exam: Estimated body mass index is 48.58 kg/m as calculated from the following:   Height as of 10/23/18: 5\' 4"  (1.626 m).   Weight as of 10/23/18: 283 lb (128.4 kg). There were no vitals taken for this visit. General Appearance: Well nourished, in no apparent distress.  Eyes: PERRLA, EOMs, conjunctiva no swelling or erythema, normal fundi and vessels.  Sinuses: No Frontal/maxillary tenderness  ENT/Mouth: Ext aud canals clear, normal light reflex with TMs without erythema, bulging. Good dentition. No erythema, swelling, or exudate on post pharynx. Tonsils not swollen or erythematous. Hearing normal.  Neck: Supple, thyroid normal. No bruits  Respiratory: Respiratory effort normal, BS equal bilaterally without rales, rhonchi, wheezing or  stridor.  Cardio: RRR without murmurs, rubs or gallops. Brisk peripheral pulses without edema.  Chest: symmetric, with normal excursions and percussion.  Breasts: Defer to GYN Abdomen: Soft, nontender, no guarding, rebound, hernias, masses, or organomegaly.  Lymphatics: Non tender without lymphadenopathy.  Genitourinary: Defer to GYN Musculoskeletal: Full ROM all peripheral extremities,5/5 strength, and normal gait.  Skin: Warm, dry without rashes, lesions, ecchymosis. Neuro: Cranial nerves intact, reflexes equal bilaterally. Normal muscle tone, no cerebellar symptoms. Sensation intact.  Psych: Awake and oriented X 3, normal affect, Insight and Judgment appropriate.   EKG: WNL no ST changes.  Dan Maker 8:46 AM [facility]

## 2019-12-01 ENCOUNTER — Encounter: Payer: Self-pay | Admitting: Adult Health

## 2019-12-02 ENCOUNTER — Other Ambulatory Visit: Payer: Self-pay

## 2019-12-02 MED ORDER — ALPRAZOLAM 1 MG PO TABS: ORAL_TABLET | ORAL | 0 refills | Status: DC

## 2019-12-10 NOTE — Progress Notes (Deleted)
Complete Physical  Assessment and Plan:  Encounter for general adult medical examination with abnormal findings  Morbid obesity (BMI 40.37) Long discussion about weight loss, diet, and exercise Recommended diet heavy in fruits and veggies and low in animal meats, cheeses, and dairy products, appropriate calorie intake Patient will work on diet via weight watchers Discussed appropriate weight for height  Follow up at next visit -     TSH  Other abnormal glucose Discussed general issues about diabetes pathophysiology and management., Educational material distributed., Suggested low cholesterol diet., Encouraged aerobic exercise., Discussed foot care., Reminded to get yearly retinal exam. -     TSH -     Hemoglobin A1c -     Urinalysis, Routine w reflex microscopic (not at [facility]) -     Microalbumin / creatinine urine ratio -     EKG 12-Lead  Other migraine without status migrainosus, not intractable  continue nadolol for now, ? Hx of good prophylaxis with topamax, will try this with nadolol and if doing well can try to taper off BB Continue imitrex Refer to neuro if not improving to discuss novel agents Follow up 3 months  Kidney stones Increase fluids  Anxiety Continue zoloft **** Stress management techniques discussed, increase water, good sleep hygiene discussed, increase exercise, and increase veggies.  -     sertraline (ZOLOFT) 100 MG tablet; Take 2 tablet (100 mg total) by mouth daily.  Insomnia, unspecified type Continue medications  Recurrent major depressive disorder, in partial remission (HCC) *** -     lisdexamfetamine (VYVANSE) 60 MG capsule; Take 1 capsule (60 mg total) by mouth every morning. -     sertraline (ZOLOFT) 100 MG tablet; Take 2 tablet (100 mg total) by mouth daily.  Vitamin D deficiency -     VITAMIN D 25 Hydroxy (Vit-D Deficiency, Fractures)  Medication management -     CBC with Differential/Platelet -     CMP/GFR -     Magnesium  Attention  deficit disorder, unspecified hyperactivity presence -     lisdexamfetamine (VYVANSE) 60 MG capsule; Take 1 capsule (60 mg total) by mouth every morning.  Hyperlipidemia, unspecified hyperlipidemia type -     Lipid panel -     EKG 12-Lead   Discussed med's effects and SE's. Screening labs and tests as requested with regular follow-up as recommended. Over 40 minutes of exam, counseling, chart review, and complex, high level critical decision making was performed this visit.   Future Appointments  Date Time Provider Department Center  12/15/2019  3:00 PM Judd Gaudier, NP [facility] None  11/30/2020  3:00 PM Judd Gaudier, NP [facility] None  12/13/2020  3:00 PM Judd Gaudier, NP [facility] None     HPI  49 y.o. female  presents for a complete physical and follow up for has Migraines; Kidney stones; Insomnia; 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); Morbid obesity with BMI of 45.0-49.9, adult (HCC); Other abnormal glucose; Vitamin D deficiency; ADD (attention deficit disorder); Hyperlipidemia; and Former smoker (quit 2004, 9 pack/year hx) on their problem list.     Patient is on an ADD medication, she states that the medication is helping and she denies any adverse reactions.  She was doing very well on the vyvanse, but due to insurance/cost back on adderall   She has anxiety/depression controlled with zoloft, xanax 1 at lunch, 1 at night and on trazodone at night, reports stress levels are very high at work, has a foreclosure situation at home, ex and legal  concerns, etc. She does report she can sleep through the night.    She has dx of migraines, associated with stress and menstrual cycle.  Currently having 1-2 per month, significantly limits her and has to call out frequently r/t this, was referred to Dr. Karel Jarvis who initiated amitriptyline  Takes imitrex vs maxalt PRN abortive   BMI is There is no height or weight on file to calculate BMI., she has been  working on diet (has started [employer]), exercise is limited.  Wt Readings from Last 3 Encounters:  10/23/18 283 lb (128.4 kg)  10/21/18 283 lb 9.6 oz (128.6 kg)  07/28/18 274 lb (124.3 kg)   She is a former smoker, 9 pack/year history, quit in 2004. Last CXR 11/2018 for bronchitis.   Her blood pressure has been controlled at home, today their BP is   She does not workout. She denies chest pain, shortness of breath, dizziness.   She is not on cholesterol medication and denies myalgias. Her cholesterol is not at goal. The cholesterol last visit was:   Lab Results  Component Value Date   CHOL 210 (H) 02/24/2018   HDL 40 (L) 02/24/2018   LDLCALC 139 (H) 02/24/2018   TRIG 174 (H) 02/24/2018   CHOLHDL 5.3 (H) 02/24/2018   She has been working on diet and exercise for hx of prediabetes (A1C 5.7% in 2016), and denies hypoglycemia , paresthesia of the feet, polydipsia, polyuria and visual disturbances. Last A1C in the office was:  Lab Results  Component Value Date   HGBA1C 5.1 02/24/2018    She is in CKD II range:  Lab Results  Component Value Date   [facility] 83 02/24/2018   Patient is *** on Vitamin D supplement.   Lab Results  Component Value Date   VD25OH 28 (L) 02/24/2018       Current Medications:  Current Outpatient Medications on File Prior to Visit  Medication Sig Dispense Refill  . ALPRAZolam (XANAX) 1 MG tablet Take 1/2-1 tablet 1 or 2 x /day ONLY if needed for Anxiety Attack &  limit to 5 days /week to avoid addiction. Should last 6 weeks - 08/25/2019 60 tablet 0  . amphetamine-dextroamphetamine (ADDERALL) 20 MG tablet Take 1 tablet Daily Only on workdays & Rx should last 6 weeks 30 tablet 0  . benzonatate (TESSALON) 200 MG capsule Take 1 capsule (200 mg total) by mouth every 8 (eight) hours as needed for cough. 40 capsule 5  . doxycycline (VIBRA-TABS) 100 MG tablet Take 1 tablet (100 mg total) by mouth 2 (two) times daily. 14 tablet 0  . HYDROcodone-homatropine  (HYCODAN) 5-1.5 MG/5ML syrup Take 5 cc at night for severe cough as needed. Do NOT take with alcohol or xanax. Do not share, this is controlled substance. 120 mL 0  . nortriptyline (PAMELOR) 10 MG capsule Take 1 capsule every night for 1 week, then increase to 2 capsules every night 60 capsule 5  . predniSONE (DELTASONE) 20 MG tablet 2 tablets daily for 3 days, 1 tablet daily for 4 days. 10 tablet 0  . promethazine-dextromethorphan (PROMETHAZINE-DM) 6.25-15 MG/5ML syrup Take 5 mLs by mouth 4 (four) times daily as needed for cough. 240 mL 1  . rizatriptan (MAXALT) 10 MG tablet Take 1 tablet as needed for migraine. Do not take more than 2-3 a week 10 tablet 11  . sertraline (ZOLOFT) 100 MG tablet TAKE 2 TABLETS BY MOUTH DAILY FOR MOOD. 90 tablet 1  . SUMAtriptan (IMITREX) 100 MG tablet  TAKE 1 TABLET BY MOUTH AS NEEDED FOR MIGRAINE, MAY REPEAT IN 2 HOURS IF HEADACHE PERSISTS OR RECURS 10 tablet 2  . traZODone (DESYREL) 150 MG tablet Take 1 tablet at Bedtime for Sleep 90 tablet 3   No current facility-administered medications on file prior to visit.   Allergies:  Allergies  Allergen Reactions  . Sulfa Antibiotics Itching  . Amoxicillin Rash  . Penicillins Rash   Medical History:  She has Migraines; Kidney stones; Insomnia; 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); Morbid obesity with BMI of 45.0-49.9, adult (HCC); Other abnormal glucose; Vitamin D deficiency; ADD (attention deficit disorder); Hyperlipidemia; and Former smoker (quit 2004, 9 pack/year hx) on their problem list.   Health Maintenance:   Immunization History  Administered Date(s) Administered  . Influenza,inj,Quad PF,6+ Mos 06/04/2016  . Influenza-Unspecified 05/29/2017, 06/09/2018  . Pneumococcal-Unspecified 09/02/1996  . Td 10/05/2002   Tetanus:2016 Pneumovax: 1998 Prevnar 13:  Flu vaccine: 2018  Zostavax:  No LMP recorded. Pap: 10/2015 Dr. Audie Box MGM: 07/2016 DUE will get at Dr. Audie Box *** DEXA:  N/A Colonoscopy: N/A EGD: N/A CXR 2016  Patient Care Team: Lucky Cowboy, MD as PCP - General (Internal Medicine) Quentin Mulling, PA-C (Physician Assistant)  Surgical History:  She has a past surgical history that includes Cesarean section; Pelvic laparoscopy (2004); Tonsillectomy (2011); and Wisdom tooth extraction (1990). Family History:  Herfamily history includes ADD / ADHD in her daughter and son; Alcohol abuse in her father; Alzheimer's disease in her maternal aunt, maternal grandfather, and maternal grandmother; Anxiety disorder in her mother; Autism in her son; Celiac disease in her sister; Depression in her father and mother; Heart attack in her paternal grandmother; Hypertension in her mother; Migraines in her mother; Stroke in her paternal grandfather. Social History:  She reports that she quit smoking about 16 years ago. She has never used smokeless tobacco. She reports current alcohol use of about 14.0 standard drinks of alcohol per week. She reports that she does not use drugs.  Review of Systems: Review of Systems  Constitutional: Negative.  Negative for malaise/fatigue and weight loss.  HENT: Negative.  Negative for hearing loss and tinnitus.   Eyes: Negative.  Negative for blurred vision and double vision.  Respiratory: Negative.  Negative for cough, sputum production, shortness of breath and wheezing.   Cardiovascular: Negative.  Negative for chest pain, palpitations, orthopnea, claudication, leg swelling and PND.  Gastrointestinal: Negative.  Negative for abdominal pain, blood in stool, constipation, diarrhea, heartburn, melena, nausea and vomiting.  Genitourinary: Negative.   Musculoskeletal: Negative.  Negative for falls, joint pain and myalgias.  Skin: Negative.  Negative for rash.  Neurological: Positive for headaches. Negative for dizziness, tingling, sensory change and weakness.  Endo/Heme/Allergies: Negative.  Negative for polydipsia.   Psychiatric/Behavioral: Positive for depression. Negative for memory loss, substance abuse and suicidal ideas. The patient is nervous/anxious and has insomnia.   All other systems reviewed and are negative.   Physical Exam: Estimated body mass index is 48.58 kg/m as calculated from the following:   Height as of 10/23/18: 5\' 4"  (1.626 m).   Weight as of 10/23/18: 283 lb (128.4 kg). There were no vitals taken for this visit. General Appearance: Well nourished, in no apparent distress.  Eyes: PERRLA, EOMs, conjunctiva no swelling or erythema, normal fundi and vessels.  Sinuses: No Frontal/maxillary tenderness  ENT/Mouth: Ext aud canals clear, normal light reflex with TMs without erythema, bulging. Good dentition. No erythema, swelling, or exudate on post pharynx. Tonsils not swollen  or erythematous. Hearing normal.  Neck: Supple, thyroid normal. No bruits  Respiratory: Respiratory effort normal, BS equal bilaterally without rales, rhonchi, wheezing or stridor.  Cardio: RRR without murmurs, rubs or gallops. Brisk peripheral pulses without edema.  Chest: symmetric, with normal excursions and percussion.  Breasts: Defer to GYN Abdomen: Soft, nontender, no guarding, rebound, hernias, masses, or organomegaly.  Lymphatics: Non tender without lymphadenopathy.  Genitourinary: Defer to GYN Musculoskeletal: Full ROM all peripheral extremities,5/5 strength, and normal gait.  Skin: Warm, dry without rashes, lesions, ecchymosis. Neuro: Cranial nerves intact, reflexes equal bilaterally. Normal muscle tone, no cerebellar symptoms. Sensation intact.  Psych: Awake and oriented X 3, normal affect, Insight and Judgment appropriate.   EKG: WNL no ST changes.  Izora Ribas 8:18 AM [facility]

## 2019-12-15 ENCOUNTER — Encounter: Payer: Self-pay | Admitting: Adult Health

## 2019-12-15 ENCOUNTER — Other Ambulatory Visit: Payer: Self-pay | Admitting: Adult Health

## 2019-12-15 DIAGNOSIS — Z0001 Encounter for general adult medical examination with abnormal findings: Secondary | ICD-10-CM

## 2019-12-15 DIAGNOSIS — F419 Anxiety disorder, unspecified: Secondary | ICD-10-CM

## 2019-12-15 DIAGNOSIS — F3341 Major depressive disorder, recurrent, in partial remission: Secondary | ICD-10-CM

## 2019-12-15 MED ORDER — SERTRALINE HCL 100 MG PO TABS: ORAL_TABLET | ORAL | 0 refills | Status: DC

## 2019-12-28 ENCOUNTER — Encounter (HOSPITAL_COMMUNITY): Payer: Self-pay | Admitting: Emergency Medicine

## 2019-12-28 ENCOUNTER — Emergency Department (HOSPITAL_COMMUNITY): Payer: Self-pay

## 2019-12-28 ENCOUNTER — Other Ambulatory Visit: Payer: Self-pay

## 2019-12-28 ENCOUNTER — Emergency Department (HOSPITAL_COMMUNITY)
Admission: EM | Admit: 2019-12-28 | Discharge: 2019-12-28 | Disposition: A | Discharge status: Home / Self Care | Payer: Self-pay | Attending: Emergency Medicine | Admitting: Emergency Medicine

## 2019-12-28 DIAGNOSIS — Z87891 Personal history of nicotine dependence: Secondary | ICD-10-CM | POA: Insufficient documentation

## 2019-12-28 DIAGNOSIS — Z79899 Other long term (current) drug therapy: Secondary | ICD-10-CM | POA: Insufficient documentation

## 2019-12-28 DIAGNOSIS — F909 Attention-deficit hyperactivity disorder, unspecified type: Secondary | ICD-10-CM | POA: Insufficient documentation

## 2019-12-28 DIAGNOSIS — R079 Chest pain, unspecified: Secondary | ICD-10-CM

## 2019-12-28 DIAGNOSIS — Z6841 Body Mass Index (BMI) 40.0 and over, adult: Secondary | ICD-10-CM | POA: Insufficient documentation

## 2019-12-28 DIAGNOSIS — E669 Obesity, unspecified: Secondary | ICD-10-CM | POA: Insufficient documentation

## 2019-12-28 DIAGNOSIS — R0602 Shortness of breath: Secondary | ICD-10-CM | POA: Insufficient documentation

## 2019-12-28 DIAGNOSIS — R0789 Other chest pain: Secondary | ICD-10-CM | POA: Insufficient documentation

## 2019-12-28 LAB — CBC
HCT: 41.1 % (ref 36.0–46.0)
Hemoglobin: 12.4 g/dL (ref 12.0–15.0)
MCH: 27.2 pg (ref 26.0–34.0)
MCHC: 30.2 g/dL (ref 30.0–36.0)
MCV: 90.1 fL (ref 80.0–100.0)
Platelets: 263 10*3/uL (ref 150–400)
RBC: 4.56 MIL/uL (ref 3.87–5.11)
RDW: 15.1 % (ref 11.5–15.5)
WBC: 6.6 10*3/uL (ref 4.0–10.5)
nRBC: 0 % (ref 0.0–0.2)

## 2019-12-28 LAB — BASIC METABOLIC PANEL
Anion gap: 10 (ref 5–15)
BUN: 11 mg/dL (ref 6–20)
CO2: 24 mmol/L (ref 22–32)
Calcium: 8.6 mg/dL — ABNORMAL LOW (ref 8.9–10.3)
Chloride: 106 mmol/L (ref 98–111)
Creatinine, Ser: 0.7 mg/dL (ref 0.44–1.00)
GFR calc Af Amer: >60 mL/min (ref >60)
GFR calc non Af Amer: >60 mL/min (ref >60)
Glucose, Bld: 100 mg/dL — ABNORMAL HIGH (ref 70–99)
Potassium: 4.4 mmol/L (ref 3.5–5.1)
Sodium: 140 mmol/L (ref 135–145)

## 2019-12-28 LAB — TROPONIN I (HIGH SENSITIVITY)
Troponin I (High Sensitivity): <2 ng/L (ref <18)
Troponin I (High Sensitivity): <2 ng/L (ref <18)

## 2019-12-28 LAB — I-STAT BETA HCG BLOOD, ED (MC, WL, AP ONLY): I-stat hCG, quantitative: <5 m[IU]/mL (ref <5)

## 2019-12-28 NOTE — ED Triage Notes (Signed)
Pt endorses chest heaviness for 3 days that worsened last night. Endorses SOB, left leg pain on inside thigh and foot. No cardiac hx, but reports a family hx.

## 2019-12-28 NOTE — Discharge Instructions (Signed)
Please follow-up with your primary care doctor.  If you change your mind and decide he would like to follow-up with cardiology I have included in her discharge paperwork some information for one of their offices.  He may give him a phone call and discuss with them setting up an appointment.  Please return to ED for any new or concerning symptoms such as those that we discussed.

## 2019-12-28 NOTE — ED Provider Notes (Signed)
[facility] EMERGENCY DEPARTMENT Provider Note   CSN: [id number] Arrival date & time: 12/28/19  1142     History Chief Complaint  Patient presents with  . Chest Pain    Marie Ingram is a 49 y.o. female.  HPI  Patient is a 49 year old female with history of anxiety, depression, insomnia, migraines, kidney stones presented today with chest pressure for 3 days.  She denies any chest pain at all.  She states that it feels like a beat is not her chest.  She also endorses some shortness of breath which is worse when she exerts herself.  She states she is not a current smoker but did smoke some in the past however she states that it has been many years.  She denies any pleuritic or exertional chest pain.  Denies any lightheadedness or dizziness.  Denies any palpitations.  Denies any unilateral leg swelling or pain.  HPI: A 49 year old patient with a history of obesity presents for evaluation of chest pain. Initial onset of pain was more than 6 hours ago. The patient's chest pain is described as heaviness/pressure/tightness and is not worse with exertion. The patient's chest pain is not middle- or left-sided, is not well-localized, is not sharp and does not radiate to the arms/jaw/neck. The patient does not complain of nausea and denies diaphoresis. The patient has no history of stroke, has no history of peripheral artery disease, has not smoked in the past 90 days, denies any history of treated diabetes, has no relevant family history of coronary artery disease (first degree relative at less than age 40), is not hypertensive and has no history of hypercholesterolemia.   Past Medical History:  Diagnosis Date  . Anxiety   . Depression   . Endometriosis   . Insomnia   . Kidney stones   . Migraines     Patient Active Problem List   Diagnosis Date Noted  . Former smoker (quit 2004, 9 pack/year hx) 11/29/2019  . Hyperlipidemia 09/09/2018  . ADD (attention deficit  disorder) 09/24/2017  . Other abnormal glucose 10/04/2015  . Vitamin D deficiency 10/04/2015  . Morbid obesity with BMI of 45.0-49.9, adult (HCC) 05/27/2014  . Migraines   . Kidney stones   . Insomnia   . 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 CESAREAN SECTION     X 2  . PELVIC LAPAROSCOPY  2004   Laser Endometriosis  . TONSILLECTOMY  2011  . WISDOM TOOTH EXTRACTION  1990     OB History    Gravida  2   Para  2   Term      Preterm      AB      Living  2     SAB      TAB      Ectopic      Multiple      Live Births              Family History  Problem Relation Age of Onset  . Migraines Mother   . Hypertension Mother   . Anxiety disorder Mother   . Depression Mother   . Depression Father   . Alcohol abuse Father   . Celiac disease Sister   . ADD / ADHD Daughter   . Autism Son   . ADD / ADHD Son   . Alzheimer's disease Maternal Grandmother   . Alzheimer's disease Maternal  Grandfather   . Heart attack Paternal Grandmother   . Stroke Paternal Grandfather   . Alzheimer's disease Maternal Aunt        x3    Social History   Tobacco Use  . Smoking status: Former Smoker    Quit date: 05/19/2003    Years since quitting: 16.6  . Smokeless tobacco: Never Used  Substance Use Topics  . Alcohol use: Yes    Alcohol/week: 14.0 standard drinks    Types: 14 Glasses of wine per week  . Drug use: No    Home Medications Prior to Admission medications   Medication Sig Start Date End Date Taking? Authorizing Provider  acetaminophen (TYLENOL) 500 MG tablet Take 1,000 mg by mouth every 6 (six) hours as needed for mild pain.   Yes [provider]  ALPRAZolam (XANAX) 1 MG tablet Take 1/2-1 tablet 1 or 2 x /day ONLY if needed for Anxiety Attack &  limit to 5 days /week to avoid addiction. Should last 6 weeks - 08/25/2019 Patient taking differently: Take 0.5-1 mg by mouth daily  as needed for anxiety.  12/02/19  Yes Liane Comber, NP  ibuprofen (ADVIL) 200 MG tablet Take 600-800 mg by mouth every 6 (six) hours as needed for moderate pain.   Yes [provider]  sertraline (ZOLOFT) 100 MG tablet TAKE 2 TABLETS BY MOUTH DAILY FOR MOOD. Patient taking differently: Take 200 mg by mouth at bedtime.  12/15/19  Yes Liane Comber, NP  traZODone (DESYREL) 150 MG tablet Take 1 tablet at Bedtime for Sleep Patient taking differently: Take 150 mg by mouth at bedtime.  08/14/19  Yes Unk Pinto, MD    Allergies    Sulfa antibiotics, Amoxicillin, and Penicillins  Review of Systems   Review of Systems  Constitutional: Negative for chills and fever.  HENT: Negative for congestion.   Eyes: Negative for pain.  Respiratory: Positive for shortness of breath. Negative for cough.   Cardiovascular: Positive for chest pain. Negative for leg swelling.  Gastrointestinal: Negative for abdominal pain and vomiting.  Genitourinary: Negative for dysuria.  Musculoskeletal: Negative for myalgias.  Skin: Negative for rash.  Neurological: Negative for dizziness and headaches.    Physical Exam Updated Vital Signs BP (!) 143/65   Pulse 86   Temp 97.7 F (36.5 C) (Oral)   Resp 19   Ht 5\' 5"  (1.651 m)   Wt 127 kg   SpO2 98%   BMI 46.59 kg/m   Physical Exam Vitals and nursing note reviewed.  Constitutional:      General: She is not in acute distress.    Appearance: She is obese.  HENT:     Head: Normocephalic and atraumatic.     Nose: Nose normal.  Eyes:     General: No scleral icterus. Cardiovascular:     Rate and Rhythm: Normal rate and regular rhythm.     Pulses: Normal pulses.     Heart sounds: Normal heart sounds.  Pulmonary:     Effort: Pulmonary effort is normal. No respiratory distress.     Breath sounds: No wheezing.  Abdominal:     Palpations: Abdomen is soft.     Tenderness: There is no abdominal tenderness.  Musculoskeletal:     Cervical back:  Normal range of motion.     Right lower leg: No edema.     Left lower leg: No edema.  Skin:    General: Skin is warm and dry.     Capillary Refill: Capillary refill  takes less than 2 seconds.  Neurological:     Mental Status: She is alert. Mental status is at baseline.  Psychiatric:        Mood and Affect: Mood normal.        Behavior: Behavior normal.     ED Results / Procedures / Treatments   Labs (all labs ordered are listed, but only abnormal results are displayed) Labs Reviewed  BASIC METABOLIC PANEL - Abnormal; Notable for the following components:      Result Value   Glucose, Bld 100 (*)    Calcium 8.6 (*)    All other components within normal limits  CBC  I-STAT BETA HCG BLOOD, ED ([facility], [facility], [facility] ONLY)  TROPONIN I (HIGH SENSITIVITY)  TROPONIN I (HIGH SENSITIVITY)    EKG EKG Interpretation  Date/Time:  Tuesday December 28 2019 11:44:54 EDT Ventricular Rate:  94 PR Interval:  130 QRS Duration: 88 QT Interval:  366 QTC Calculation: 457 R Axis:   87 Text Interpretation: Normal sinus rhythm Normal ECG No significant change since last tracing Confirmed by Gwyneth Sprout ([id number]) on 12/28/2019 2:15:54 PM   Radiology DG Chest 2 View  Result Date: 12/28/2019 CLINICAL DATA:  49 year old female with chest pain and heaviness for 3 days progressive last night. Shortness of breath. Left leg pain. EXAM: CHEST - 2 VIEW COMPARISON:  Chest radiographs 11/02/2018 and earlier. FINDINGS: Lower lung volumes. Mediastinal contours remain normal. Visualized tracheal air column is within normal limits. Mild crowding of lung markings but otherwise both lungs appear clear with no pneumothorax or pleural effusion. EKG button artifact in both upper lungs. No acute osseous abnormality identified. Negative visible bowel gas pattern. IMPRESSION: Lower lung volumes.  No cardiopulmonary abnormality. Electronically Signed   By: Odessa Fleming M.D.   On: 12/28/2019 12:41    Procedures Procedures (including  critical care time)  Medications Ordered in ED Medications - No data to display  ED Course  I have reviewed the triage vital signs and the nursing notes.  Pertinent labs & imaging results that were available during my care of the patient were reviewed by me and considered in my medical decision making (see chart for details).  Patient is 49 year old female with past medical history detailed above.  Presented for 3 days of chest heaviness.  She denies any chest pain, pleuritic chest pain, sharp stabbing pain, denies any exertional pain.  States she feels somewhat short of breath.  The causes for shortness of breath include but are not limited to Cardiac (AHF, pericardial effusion and tamponade, arrhythmias, ischemia, etc) Respiratory (COPD, asthma, pneumonia, pneumothorax, primary pulmonary hypertension, PE)  Hematological (anemia) Neuromuscular (ALS, Guillain-Barr, etc)  Clinical Course as of Dec 28 1910  Tue Dec 28, 2019  1541 Low lung volumes potentially due to patient's obesity or poor inhalation during study.  No acute intrapulmonary abnormalities.  Normal cardiac silhouette.  DG Chest 2 View [WF]  1542 NP without any acute abnormality electrolytes.  Basic metabolic panel(!) [WF]  1542 Troponin X1 less than 2  Troponin I (High Sensitivity) [WF]  1542 hCG undetectable.  Patient is not pregnant.  I-Stat beta hCG blood, ED [WF]  1659 Both EKGs reviewed by myself personally as well as Dr. Anitra Lauth (who reviewed the first).  No change from 1st-2nd EKG.   [WF]  1700 Troponin next to within normal limits no changes.  Troponin I (High Sensitivity) [WF]    Clinical Course User Index [WF] Gailen Shelter, [place]    Patient is  PERC negative.  Doubt PE.  I discussed my low suspicion for this with patient she is understanding of plan to discharge home with strict return precautions.  She continues to be not hypoxic or tachycardic.  She is afebrile.  Chest x-ray interpreted above.  Apart  from low lung volumes she has no acute abnormality.  I have some suspicion for obesity hypoventilation syndrome VS atypical viral respiratory tract infection.   Patient given strict return precautions.  She will follow-up with her primary care doctor for reevaluation.  Also provided patient with cardiology referral.  She is a heart score of 2.  Is low risk and good candidate for outpatient follow-up.  The emergent causes of chest pain include: Acute coronary syndrome, tamponade, pericarditis/myocarditis, aortic dissection, pulmonary embolism, tension pneumothorax, pneumonia, and esophageal rupture.  I do not believe the patient has an emergent cause of chest pain, other urgent/non-acute considerations include, but are not limited to: chronic angina, aortic stenosis, cardiomyopathy, mitral valve prolapse, pulmonary hypertension, aortic insufficiency, right ventricular hypertrophy, pleuritis, bronchitis, pneumothorax, tumor, gastroesophageal reflux disease (GERD), esophageal spasm, Mallory-Weiss syndrome, peptic ulcer disease, pancreatitis, functional gastrointestinal pain, cervical or thoracic disk disease or arthritis, shoulder arthritis, costochondritis, subacromial bursitis, anxiety or panic attack, herpes zoster, breast disorders, chest wall tumors, thoracic outlet syndrome, mediastinitis.  Patient will follow up with her primary care doctor.  Recommended close follow-up, she will return to ED if she has any new or concerning symptoms.  She is not hypoxic, ambulatory without dyspnea, well-appearing at time of discharge with vital signs within normal limits apart from mildly elevated systolic blood pressure of 143.   MDM Rules/Calculators/A&P HEAR Score: 2                    Final Clinical Impression(s) / ED Diagnoses Final diagnoses:  Chest pain, unspecified type  SOB (shortness of breath)    Rx / DC Orders ED Discharge Orders    None       Gailen Shelter, [place] 12/28/19 1914     Gwyneth Sprout, MD 12/30/19 2156

## 2019-12-28 NOTE — ED Notes (Signed)
Patient verbalizes understanding of discharge instructions. Opportunity for questioning and answers were provided. Armband removed by staff, pt discharged from ED ambulatory.   

## 2020-01-24 ENCOUNTER — Other Ambulatory Visit: Payer: Self-pay | Admitting: Adult Health

## 2020-01-24 DIAGNOSIS — F3341 Major depressive disorder, recurrent, in partial remission: Secondary | ICD-10-CM

## 2020-01-24 DIAGNOSIS — Z0001 Encounter for general adult medical examination with abnormal findings: Secondary | ICD-10-CM

## 2020-01-24 DIAGNOSIS — F419 Anxiety disorder, unspecified: Secondary | ICD-10-CM

## 2020-01-28 ENCOUNTER — Other Ambulatory Visit: Payer: Self-pay

## 2020-01-28 ENCOUNTER — Encounter: Payer: Self-pay | Admitting: Physician Assistant

## 2020-01-28 ENCOUNTER — Ambulatory Visit (INDEPENDENT_AMBULATORY_CARE_PROVIDER_SITE_OTHER): Payer: Self-pay | Admitting: Physician Assistant

## 2020-01-28 VITALS — BP 125/81 | HR 96 | Temp 98.3°F | Ht 64.5 in | Wt 270.4 lb

## 2020-01-28 DIAGNOSIS — Z6841 Body Mass Index (BMI) 40.0 and over, adult: Secondary | ICD-10-CM

## 2020-01-28 DIAGNOSIS — Z7689 Persons encountering health services in other specified circumstances: Secondary | ICD-10-CM

## 2020-01-28 DIAGNOSIS — F419 Anxiety disorder, unspecified: Secondary | ICD-10-CM

## 2020-01-28 DIAGNOSIS — R059 Cough, unspecified: Secondary | ICD-10-CM

## 2020-01-28 DIAGNOSIS — Z1231 Encounter for screening mammogram for malignant neoplasm of breast: Secondary | ICD-10-CM

## 2020-01-28 DIAGNOSIS — G4700 Insomnia, unspecified: Secondary | ICD-10-CM

## 2020-01-28 DIAGNOSIS — F330 Major depressive disorder, recurrent, mild: Secondary | ICD-10-CM

## 2020-01-28 DIAGNOSIS — K469 Unspecified abdominal hernia without obstruction or gangrene: Secondary | ICD-10-CM

## 2020-01-28 DIAGNOSIS — R05 Cough: Secondary | ICD-10-CM

## 2020-01-28 MED ORDER — TRAZODONE HCL 150 MG PO TABS: ORAL_TABLET | ORAL | 3 refills | Status: DC

## 2020-01-28 MED ORDER — ALPRAZOLAM 1 MG PO TABS: ORAL_TABLET | ORAL | 0 refills | Status: DC

## 2020-01-28 MED ORDER — HYDROXYZINE HCL 25 MG PO TABS: ORAL_TABLET | ORAL | 2 refills | Status: DC

## 2020-01-28 NOTE — Progress Notes (Signed)
Established Patient Office Visit  Subjective:  Patient ID: Marie Ingram, female    DOB: 06-15-1971  Age: 49 y.o. MRN: [id number]  CC:  Chief Complaint  Patient presents with  . Establish Care    HPI [name] presents to establish care with complaints of hernia and cough. Pt reports cough has improved and feels like it is probably related to GERD. She started [employer] meal plan 3 weeks ago and has noticed her cough is better. She has lost about 14 pounds and feels an improvement in her overall health including mood. She also has an abdominal hernia that was bothersome 3 days ago where she couldn't lay down on it because it was painful. She took a laxative and the next morning hernia was reducible and no longer problematic.   Past Medical History:  Diagnosis Date  . Anxiety   . Depression   . Endometriosis   . Insomnia   . Kidney stones   . Migraines     Past Surgical History:  Procedure Laterality Date  . CESAREAN SECTION     X 2  . ENDOMETRIAL ABLATION    . PELVIC LAPAROSCOPY  2004   Laser Endometriosis  . TONSILLECTOMY  2011  . WISDOM TOOTH EXTRACTION  1990    Family History  Problem Relation Age of Onset  . Migraines Mother   . Hypertension Mother   . Anxiety disorder Mother   . Depression Mother   . Depression Father   . Alcohol abuse Father   . Heart disease Father   . Celiac disease Sister   . Depression Sister   . ADD / ADHD Daughter   . Autism Son   . ADD / ADHD Son   . Alzheimer's disease Maternal Grandmother   . Alzheimer's disease Maternal Grandfather   . Heart attack Paternal Grandmother   . Stroke Paternal Grandfather   . Alzheimer's disease Maternal Aunt        x3    Social History   Socioeconomic History  . Marital status: Significant Other    Spouse name: Not on file  . Number of children: 2  . Years of education: Not on file  . Highest education level: Not on file  Occupational History  . Not on file  Tobacco Use  .  Smoking status: Former Smoker    Packs/day: 0.50    Years: 8.00    Pack years: 4.00    Types: Cigarettes    Quit date: 05/19/2003    Years since quitting: 16.7  . Smokeless tobacco: Never Used  Vaping Use  . Vaping Use: Never used  Substance and Sexual Activity  . Alcohol use: Yes    Alcohol/week: 5.0 standard drinks    Types: 5 Shots of liquor per week  . Drug use: No  . Sexual activity: Yes    Partners: Male    Birth control/protection: None, Surgical    Comment: 1st intercourse 49 yo-More than 5 partners  Other Topics Concern  . Not on file  Social History Narrative   Pt lives in single story home with her significant other and her son   Has 2 children (adult daughter no longer lives in the home)   High school graduate   Works as Scientist, physiological at [employer]    Social Determinants of Health   Financial Resource Strain:   . Difficulty of Paying Living Expenses:   Food Insecurity:   . Worried About Programme researcher, broadcasting/film/video  in the Last Year:   . [place] in the Last Year:   Transportation Needs:   . Film/video editor (Medical):   Marland Kitchen Lack of Transportation (Non-Medical):   Physical Activity:   . Days of Exercise per Week:   . Minutes of Exercise per Session:   Stress:   . Feeling of Stress :   Social Connections:   . Frequency of Communication with Friends and Family:   . Frequency of Social Gatherings with Friends and Family:   . Attends Religious Services:   . Active Member of Clubs or Organizations:   . Attends Archivist Meetings:   Marland Kitchen Marital Status:   Intimate Partner Violence:   . Fear of Current or Ex-Partner:   . Emotionally Abused:   Marland Kitchen Physically Abused:   . Sexually Abused:     Outpatient Medications Prior to Visit  Medication Sig Dispense Refill  . acetaminophen (TYLENOL) 500 MG tablet Take 1,000 mg by mouth every 6 (six) hours as needed for mild pain.    Marland Kitchen ALPRAZolam (XANAX) 1 MG tablet Take 1/2-1 tablet 1 or 2 x  /day ONLY if needed for Anxiety Attack &  limit to 5 days /week to avoid addiction. Should last 6 weeks - 08/25/2019 (Patient taking differently: Take 0.5-1 mg by mouth daily as needed for anxiety. ) 60 tablet 0  . ibuprofen (ADVIL) 200 MG tablet Take 600-800 mg by mouth every 6 (six) hours as needed for moderate pain.    Marland Kitchen sertraline (ZOLOFT) 100 MG tablet TAKE 2 TABLETS BY MOUTH DAILY FOR MOOD. 60 tablet 0  . traZODone (DESYREL) 150 MG tablet Take 1 tablet at Bedtime for Sleep (Patient taking differently: Take 150 mg by mouth at bedtime. ) 90 tablet 3   No facility-administered medications prior to visit.    Allergies  Allergen Reactions  . Sulfa Antibiotics Itching  . Amoxicillin Rash  . Penicillins Rash    ROS Review of Systems  General: Denies fever, chills, unexplained weight loss.  Optho/Auditory: Denies visual changes, blurred vision/LOV Respiratory: Denies SOB, DOE more than baseline levels.  Cardiovascular: Denies chest pain, palpitations, new onset peripheral edema  Gastrointestinal: Denies nausea, vomiting, diarrhea.  Genitourinary: Denies dysuria, freq/ urgency, flank pain  Endocrine:  Denies hot or cold intolerance, polyuria, polydipsia. Musculoskeletal: Denies unexplained myalgias, joint swelling, unexplained arthralgias, gait problems.  Skin: Denies rash, suspicious lesions Neurological: Denies dizziness, unexplained weakness, numbness  Psychiatric/Behavioral: Denies mood changes, suicidal or homicidal ideations, hallucinations    Objective:    Physical Exam  General:  Well Developed, well nourished, appropriate for stated age.  Neuro:  Alert and oriented, extra-ocular muscles intact  HEENT:  Normocephalic, atraumatic, neck supple, no carotid bruits appreciated  Skin:  no gross rash, warm, pink. Cardiac:  RRR, S1 S2 Respiratory:  ECTA B/L and A/P, Not using accessory muscles, speaking in full sentences- unlabored. Abd: Soft, non-tender, NBS, right-sided  reducible hernia below umbilicus.  Vascular:  Ext warm, no cyanosis apprec. Psych:  No HI/SI, judgement and insight good, Euthymic mood. Full Affect.   BP 125/81   Pulse 96   Temp 98.3 F (36.8 C) (Oral)   Ht 5' 4.5" (1.638 m)   Wt 270 lb 6.4 oz (122.7 kg)   SpO2 94% Comment: on RA  BMI 45.70 kg/m  Wt Readings from Last 3 Encounters:  01/28/20 270 lb 6.4 oz (122.7 kg)  12/28/19 280 lb (127 kg)  10/23/18 283 lb (128.4 kg)  Health Maintenance Due  Topic Date Due  . COVID-19 Vaccine (1) Never done  . PAP SMEAR-Modifier  10/11/2018    There are no preventive care reminders to display for this patient.  Lab Results  Component Value Date   TSH 1.31 02/24/2018   Lab Results  Component Value Date   WBC 6.6 12/28/2019   HGB 12.4 12/28/2019   HCT 41.1 12/28/2019   MCV 90.1 12/28/2019   PLT 263 12/28/2019   Lab Results  Component Value Date   NA 140 12/28/2019   K 4.4 12/28/2019   CO2 24 12/28/2019   GLUCOSE 100 (H) 12/28/2019   BUN 11 12/28/2019   CREATININE 0.70 12/28/2019   BILITOT 0.4 02/24/2018   ALKPHOS 83 04/09/2017   AST 22 02/24/2018   ALT 23 02/24/2018   PROT 6.6 02/24/2018   ALBUMIN 3.9 04/09/2017   CALCIUM 8.6 (L) 12/28/2019   ANIONGAP 10 12/28/2019   Lab Results  Component Value Date   CHOL 210 (H) 02/24/2018   Lab Results  Component Value Date   HDL 40 (L) 02/24/2018   Lab Results  Component Value Date   LDLCALC 139 (H) 02/24/2018   Lab Results  Component Value Date   TRIG 174 (H) 02/24/2018   Lab Results  Component Value Date   CHOLHDL 5.3 (H) 02/24/2018   Lab Results  Component Value Date   HGBA1C 5.1 02/24/2018      Assessment & Plan:   Problem List Items Addressed This Visit      Other   Insomnia   Relevant Medications   traZODone (DESYREL) 150 MG tablet   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 ALPRAZolam (XANAX) 1 MG tablet    Sig: Take 1 tablet as needed for severe anxiety.    Dispense:  30 tablet    Refill:  0    Order Specific Question:   Supervising Provider    Answer:   Nani Gasser D [[id number]]  . hydrOXYzine (ATARAX/VISTARIL) 25 MG tablet    Sig: Take 1 tablet by mouth every 6-8 hours as needed for anxiety    Dispense:  90 tablet    Refill:  2    Order Specific Question:   Supervising Provider    Answer:   Nani Gasser D [[id number]]  . traZODone (DESYREL) 150 MG tablet    Sig: Take 1 tablet by mouth at bedtime for sleep    Dispense:  90 tablet    Refill:  3    Patient knows to take by mouth.    Order Specific Question:   Supervising Provider    Answer:   Agapito Games [[id number]]   MDD with anxiety: - Discussed with patient the risk of dependence and tolerance of benzodiazepines and only 2 refills of 30 tablets are given in a 12 month period per office policy. Pr verbalized understanding and made verbal contract to follow  policy.  - Discussed alternative treatments and patient agreeable to starting Hydroxyzine 25 mg every 6-8 hours as needed for anxiety.  - Continue Sertraline 100 mg. - Encourage nonpharmacologic therapy such as exercise, good sleep hygiene and mindfulness interventions.  - Will continue to monitor.  Morbid Obesity: - Congratulated patient on weight loss.  - Per chart review, patient has lost 13 pounds since last wt record. - Encourage to continue with dietary changes and stay as active as possible.  Abdominal Hernia: - Hernia reducible and no  signs of strangulation or ischemia present so will continue to monitor. - Pt aware of signs and symptoms of strangulation and knows to seek immediate care.  Cough: - Has improved and could be related to GERD. - Will continue to monitor.  Insomnia: - Stable, continue Trazodone. Provided refill.    Follow-up: Return in about 3 months (around 04/29/2020) for Mood- anxiety, MDD, hernia, cough.    Mayer Masker, PA-C

## 2020-01-28 NOTE — Patient Instructions (Signed)
Hernia, Adult     A hernia is the bulging of an organ or tissue through a weak spot in the muscles of the abdomen (abdominal wall). Hernias develop most often near the belly button (navel) or the area where the leg meets the lower abdomen (groin). Common types of hernias include:  Incisional hernia. This type bulges through a scar from an abdominal surgery.  Umbilical hernia. This type develops near the navel.  Inguinal hernia. This type develops in the groin or scrotum.  Femoral hernia. This type develops under the groin, in the upper thigh area.  Hiatal hernia. This type occurs when part of the stomach slides above the muscle that separates the abdomen from the chest (diaphragm). What are the causes? This condition may be caused by:  Heavy lifting.  Coughing over a long period of time.  Straining to have a bowel movement. Constipation can lead to straining.  An incision made during an abdominal surgery.  A physical problem that is present at birth (congenital defect).  Being overweight or obese.  Smoking.  Excess fluid in the abdomen.  Undescended testicles in males. What are the signs or symptoms? The main symptom is a skin-colored, rounded bulge in the area of the hernia. However, a bulge may not always be present. It may grow bigger or be more visible when you cough or strain (such as when lifting something heavy). A hernia that can be pushed back into the area (is reducible) rarely causes pain. A hernia that cannot be pushed back into the area (is incarcerated) may lose its blood supply (become strangulated). A hernia that is incarcerated may cause:  Pain.  Fever.  Nausea and vomiting.  Swelling.  Constipation. How is this diagnosed? A hernia may be diagnosed based on:  Your symptoms and medical history.  A physical exam. Your health care provider may ask you to cough or move in certain ways to see if the hernia becomes visible.  Imaging tests, such  as: ? X-rays. ? Ultrasound. ? CT scan. How is this treated? A hernia that is small and painless may not need to be treated. A hernia that is large or painful may be treated with surgery. Inguinal hernias may be treated with surgery to prevent incarceration or strangulation. Strangulated hernias are always treated with surgery because a lack of blood supply to the trapped organ or tissue can cause it to die. Surgery to treat a hernia involves pushing the bulge back into place and repairing the weak area of the muscle or abdominal wall. Follow these instructions at home: Activity  Avoid straining.  Do not lift anything that is heavier than 10 lb (4.5 kg), or the limit that you are told, until your health care provider says that it is safe.  When lifting heavy objects, lift with your leg muscles, not your back muscles. Preventing constipation  Take actions to prevent constipation. Constipation leads to straining with bowel movements, which can make a hernia worse or cause a hernia repair to break down. Your health care provider may recommend that you: ? Drink enough fluid to keep your urine pale yellow. ? Eat foods that are high in fiber, such as fresh fruits and vegetables, whole grains, and beans. ? Limit foods that are high in fat and processed sugars, such as fried or sweet foods. ? Take an over-the-counter or prescription medicine for constipation. General instructions  When coughing, try to cough gently.  You may try to push the hernia back in place   by very gently pressing on it while lying down. Do not try to force the bulge back in if it will not push in easily.  If you are overweight, work with your health care provider to lose weight safely.  Do not use any products that contain nicotine or tobacco, such as cigarettes and e-cigarettes. If you need help quitting, ask your health care provider.  If you are scheduled for hernia repair, watch your hernia for any changes in shape,  size, or color. Tell your health care provider about any changes or new symptoms.  Take over-the-counter and prescription medicines only as told by your health care provider.  Keep all follow-up visits as told by your health care provider. This is important. Contact a health care provider if:  You develop new pain, swelling, or redness around your hernia.  You have signs of constipation, such as: ? Fewer bowel movements in a week than normal. ? Difficulty having a bowel movement. ? Stools that are dry, hard, or larger than normal. Get help right away if:  You have a fever.  You have abdomen pain that gets worse.  You feel nauseous or you vomit.  You cannot push the hernia back in place by very gently pressing on it while lying down. Do not try to force the bulge back in if it will not push in easily.  The hernia: ? Changes in shape, size, or color. ? Feels hard or tender. These symptoms may represent a serious problem that is an emergency. Do not wait to see if the symptoms will go away. Get medical help right away. Call your local emergency services (911 in the [place] Summary  A hernia is the bulging of an organ or tissue through a weak spot in the muscles of the abdomen (abdominal wall).  The main symptom is a skin-colored, rounded lump (bulge) in the hernia area. However, a bulge may not always be present. It may grow bigger or more visible when you cough or strain (such as when having a bowel movement).  A hernia that is small and painless may not need to be treated. A hernia that is large or painful may be treated with surgery.  Surgery to treat a hernia involves pushing the bulge back into place and repairing the weak part of the abdomen. This information is not intended to replace advice given to you by your health care provider. Make sure you discuss any questions you have with your health care provider. Document Revised: 12/10/2018 Document Reviewed: 05/21/2017 Elsevier  Patient Education  2020 Elsevier Inc.  

## 2020-02-10 DIAGNOSIS — K469 Unspecified abdominal hernia without obstruction or gangrene: Secondary | ICD-10-CM | POA: Insufficient documentation

## 2020-02-20 ENCOUNTER — Other Ambulatory Visit: Payer: Self-pay | Admitting: Adult Health

## 2020-02-20 DIAGNOSIS — F419 Anxiety disorder, unspecified: Secondary | ICD-10-CM

## 2020-02-20 DIAGNOSIS — Z0001 Encounter for general adult medical examination with abnormal findings: Secondary | ICD-10-CM

## 2020-02-20 DIAGNOSIS — F3341 Major depressive disorder, recurrent, in partial remission: Secondary | ICD-10-CM

## 2020-02-25 ENCOUNTER — Ambulatory Visit (INDEPENDENT_AMBULATORY_CARE_PROVIDER_SITE_OTHER)
Admission: RE | Admit: 2020-02-25 | Discharge: 2020-02-25 | Disposition: A | Discharge status: Home / Self Care | Payer: Medicaid Other | Source: Ambulatory Visit

## 2020-02-25 DIAGNOSIS — R05 Cough: Secondary | ICD-10-CM

## 2020-02-25 DIAGNOSIS — R059 Cough, unspecified: Secondary | ICD-10-CM

## 2020-02-25 MED ORDER — HYDROCODONE-HOMATROPINE 5-1.5 MG/5ML PO SYRP: 5.0000 mL | ORAL_SOLUTION | Freq: Four times a day (QID) | ORAL | 0 refills | Status: DC | PRN

## 2020-02-25 MED ORDER — BENZONATATE 100 MG PO CAPS
100.0000 mg | ORAL_CAPSULE | Freq: Three times a day (TID) | ORAL | 0 refills | Status: DC
Start: 2020-02-25 — End: 2020-11-20

## 2020-02-25 NOTE — ED Provider Notes (Signed)
Virtual Visit via Video Note:  Marie Ingram  initiated request for Telemedicine visit with [facility] team. I connected with Marie Ingram  on 02/25/2020 at 2:14 PM  for a synchronized telemedicine visit using a video enabled HIPPA compliant telemedicine application. I verified that I am speaking with Marie Ingram  using two identifiers. Janace Aris, NP  was physically located in a [facility] Urgent care site and [name] was located at a different location.   The limitations of evaluation and management by telemedicine as well as the availability of in-person appointments were discussed. Patient was informed that she  may incur a bill ( including co-pay) for this virtual visit encounter. Marie Ingram  expressed understanding and gave verbal consent to proceed with virtual visit.     History of Present Illness:Marie Ingram  is a 49 y.o. female presents with cough, congestion since Tuesday. Reporting worse over 24 hours. Reporting hacking cough. Yellow/brown sputum. Taking day quil and drinking water. Requesting stronger cough medicine. No fever, chest pain, SOB.   Past Medical History:  Diagnosis Date  . Anxiety   . Depression   . Endometriosis   . Insomnia   . Kidney stones   . Migraines     Allergies  Allergen Reactions  . Sulfa Antibiotics Itching  . Amoxicillin Rash  . Penicillins Rash        Observations/Objective: VITALS: Per patient if applicable, see vitals. GENERAL: Alert, appears well and in no acute distress. HEENT: Atraumatic, conjunctiva clear, no obvious abnormalities on inspection of external nose and ears. NECK: Normal movements of the head and neck. CARDIOPULMONARY: No increased WOB. Speaking in clear sentences. I:E ratio WNL.  MS: Moves all visible extremities without noticeable abnormality. PSYCH: Pleasant and cooperative, well-groomed. Speech normal rate and rhythm. Affect is appropriate. Insight and judgement are  appropriate. Attention is focused, linear, and appropriate.  NEURO: CN grossly intact. Oriented as arrived to appointment on time with no prompting. Moves both UE equally.  SKIN: No obvious lesions, wounds, erythema, or cyanosis noted on face or hands.     Assessment and Plan:viral URI Tessalon pearls during the day for cough and hycodan at bedtime.    Follow Up Instructions: Follow up as needed for continued or worsening symptoms     I discussed the assessment and treatment plan with the patient. The patient was provided an opportunity to ask questions and all were answered. The patient agreed with the plan and demonstrated an understanding of the instructions.   The patient was advised to call back or seek an in-person evaluation if the symptoms worsen or if the condition fails to improve as anticipated.   Janace Aris, NP  02/25/2020 2:14 PM         Janace Aris, NP 02/26/20 1201

## 2020-02-25 NOTE — Discharge Instructions (Signed)
This is most likely some sort of virus.  Take the cough medicine as prescribed as needed. Follow up as needed for continued or worsening symptoms

## 2020-03-07 ENCOUNTER — Encounter: Payer: 59 | Admitting: Adult Health

## 2020-06-06 ENCOUNTER — Encounter: Payer: Self-pay | Admitting: Adult Health

## 2020-07-19 ENCOUNTER — Other Ambulatory Visit: Payer: Self-pay | Admitting: Physician Assistant

## 2020-07-19 DIAGNOSIS — F419 Anxiety disorder, unspecified: Secondary | ICD-10-CM

## 2020-07-19 DIAGNOSIS — F330 Major depressive disorder, recurrent, mild: Secondary | ICD-10-CM

## 2020-07-19 NOTE — Telephone Encounter (Signed)
Last refill 01/28/2020.  Pt was to f/u in 04/2020 and did not.  No appt scheduled.  Tiajuana Amass, CMA

## 2020-07-20 ENCOUNTER — Telehealth: Payer: Medicaid Other | Admitting: Emergency Medicine

## 2020-07-20 DIAGNOSIS — R112 Nausea with vomiting, unspecified: Secondary | ICD-10-CM

## 2020-07-20 DIAGNOSIS — R197 Diarrhea, unspecified: Secondary | ICD-10-CM

## 2020-07-20 MED ORDER — ONDANSETRON 4 MG PO TBDP
4.0000 mg | ORAL_TABLET | Freq: Three times a day (TID) | ORAL | 0 refills | Status: DC | PRN
Start: 2020-07-20 — End: 2020-11-20

## 2020-07-20 NOTE — Progress Notes (Signed)
We are sorry that you are not feeling well. Here is how we plan to help!  Based on what you have shared with me it looks like you have a Virus that is irritating your GI tract.  Vomiting is the forceful emptying of a portion of the stomach's content through the mouth.  Although nausea and vomiting can make you feel miserable, it's important to remember that these are not diseases, but rather symptoms of an underlying illness.  When we treat short term symptoms, we always caution that any symptoms that persist should be fully evaluated in a medical office.  I have prescribed a medication that will help alleviate your symptoms and allow you to stay hydrated:  Zofran 4 mg 1 tablet every 8 hours as needed for nausea and vomiting  HOME CARE:  Drink clear liquids.  This is very important! Dehydration (the lack of fluid) can lead to a serious complication.  Start off with 1 tablespoon every 5 minutes for 8 hours.  You may begin eating bland foods after 8 hours without vomiting.  Start with saltine crackers, white bread, rice, mashed potatoes, applesauce.  After 48 hours on a bland diet, you may resume a normal diet.  Try to go to sleep.  Sleep often empties the stomach and relieves the need to vomit.  GET HELP RIGHT AWAY IF:   Your symptoms do not improve or worsen within 2 days after treatment.  You have a fever for over 3 days.  You cannot keep down fluids after trying the medication.  MAKE SURE YOU:   Understand these instructions.  Will watch your condition.  Will get help right away if you are not doing well or get worse.   Thank you for choosing an e-visit. Your e-visit answers were reviewed by a board certified advanced clinical practitioner to complete your personal care plan. Depending upon the condition, your plan could have included both over the counter or prescription medications. Please review your pharmacy choice. Be sure that the pharmacy you have chosen is open so  that you can pick up your prescription now.  If there is a problem you may message your provider in MyChart to have the prescription routed to another pharmacy. Your safety is important to us. If you have drug allergies check your prescription carefully.  For the next 24 hours, you can use MyChart to ask questions about today's visit, request a non-urgent call back, or ask for a work or school excuse from your e-visit provider. You will get an e-mail in the next two days asking about your experience. I hope that your e-visit has been valuable and will speed your recovery.   Approximately 5 minutes was used in reviewing the patient's chart, questionnaire, prescribing medications, and documentation.  

## 2020-08-03 ENCOUNTER — Encounter: Payer: Medicaid Other | Admitting: Adult Health

## 2020-08-21 ENCOUNTER — Other Ambulatory Visit: Payer: Self-pay | Admitting: Physician Assistant

## 2020-08-21 ENCOUNTER — Telehealth: Payer: Self-pay | Admitting: Physician Assistant

## 2020-08-21 DIAGNOSIS — Z0001 Encounter for general adult medical examination with abnormal findings: Secondary | ICD-10-CM

## 2020-08-21 DIAGNOSIS — F3341 Major depressive disorder, recurrent, in partial remission: Secondary | ICD-10-CM

## 2020-08-21 DIAGNOSIS — F419 Anxiety disorder, unspecified: Secondary | ICD-10-CM

## 2020-08-21 NOTE — Telephone Encounter (Signed)
Patient needs a refill on Zoloft, and uses [facility] on [address], thanks.

## 2020-08-22 ENCOUNTER — Other Ambulatory Visit: Payer: Self-pay

## 2020-08-22 DIAGNOSIS — F3341 Major depressive disorder, recurrent, in partial remission: Secondary | ICD-10-CM

## 2020-08-22 DIAGNOSIS — F419 Anxiety disorder, unspecified: Secondary | ICD-10-CM

## 2020-08-22 DIAGNOSIS — Z0001 Encounter for general adult medical examination with abnormal findings: Secondary | ICD-10-CM

## 2020-08-22 MED ORDER — SERTRALINE HCL 100 MG PO TABS: 100.0000 mg | ORAL_TABLET | Freq: Every day | ORAL | 0 refills | Status: DC

## 2020-08-22 NOTE — Telephone Encounter (Signed)
MyChart message sent to pt instructing her to schedule OV to obtain refills.  Tiajuana Amass, CMA

## 2020-09-27 ENCOUNTER — Ambulatory Visit: Payer: Medicaid Other | Admitting: Physician Assistant

## 2020-09-30 ENCOUNTER — Other Ambulatory Visit: Payer: Self-pay | Admitting: Physician Assistant

## 2020-09-30 DIAGNOSIS — Z0001 Encounter for general adult medical examination with abnormal findings: Secondary | ICD-10-CM

## 2020-09-30 DIAGNOSIS — F3341 Major depressive disorder, recurrent, in partial remission: Secondary | ICD-10-CM

## 2020-09-30 DIAGNOSIS — F419 Anxiety disorder, unspecified: Secondary | ICD-10-CM

## 2020-10-11 ENCOUNTER — Telehealth: Payer: Self-pay | Admitting: Physician Assistant

## 2020-10-11 NOTE — Telephone Encounter (Signed)
Patient has tested positive for COVID. (Documentation)

## 2020-10-16 ENCOUNTER — Ambulatory Visit: Payer: Medicaid Other | Admitting: Physician Assistant

## 2020-11-08 ENCOUNTER — Ambulatory Visit: Payer: Medicaid Other | Admitting: Physician Assistant

## 2020-11-20 ENCOUNTER — Other Ambulatory Visit: Payer: Self-pay

## 2020-11-20 ENCOUNTER — Ambulatory Visit (INDEPENDENT_AMBULATORY_CARE_PROVIDER_SITE_OTHER): Payer: Self-pay | Admitting: Physician Assistant

## 2020-11-20 ENCOUNTER — Encounter: Payer: Self-pay | Admitting: Physician Assistant

## 2020-11-20 VITALS — BP 128/80 | HR 76 | Temp 97.6°F | Ht 64.0 in | Wt 276.5 lb

## 2020-11-20 DIAGNOSIS — F330 Major depressive disorder, recurrent, mild: Secondary | ICD-10-CM

## 2020-11-20 DIAGNOSIS — G4700 Insomnia, unspecified: Secondary | ICD-10-CM

## 2020-11-20 DIAGNOSIS — F419 Anxiety disorder, unspecified: Secondary | ICD-10-CM

## 2020-11-20 DIAGNOSIS — R059 Cough, unspecified: Secondary | ICD-10-CM

## 2020-11-20 MED ORDER — ALPRAZOLAM 1 MG PO TABS: ORAL_TABLET | ORAL | 0 refills | Status: DC

## 2020-11-20 MED ORDER — SERTRALINE HCL 100 MG PO TABS: 100.0000 mg | ORAL_TABLET | Freq: Every day | ORAL | 1 refills | Status: DC

## 2020-11-20 MED ORDER — TRAZODONE HCL 150 MG PO TABS: ORAL_TABLET | ORAL | 1 refills | Status: DC

## 2020-11-20 MED ORDER — BENZONATATE 100 MG PO CAPS: 100.0000 mg | ORAL_CAPSULE | Freq: Three times a day (TID) | ORAL | 0 refills | Status: DC

## 2020-11-20 NOTE — Patient Instructions (Signed)

## 2020-11-20 NOTE — Progress Notes (Signed)
Established Patient Office Visit  Subjective:  Patient ID: Marie Ingram, female    DOB: 09/12/1970  Age: 50 y.o. MRN: [id number]  CC:  Chief Complaint  Patient presents with  . Follow-up    HPI [name] presents for follow up on mood management. Has no acute concerns. Reports had Covid infection early 10/2020 and continues to have a cough which has gradually improved. States not coughing as much but feels like it is deep. Requesting refill of tessalon perles which helped. Denies chest pain or shortness of breath.  Mood: States is having stress at home from helping take care of her mother-in-law. States has weaned off alprazolam and not using as often for anxiety. Last dose was 2 months ago. Tried hydroxyzine but felt jittery with medication so has not used it. Taking Sertraline without issues. Denies mood changes or SI/HI.  Insomnia: Taking trazodone which helps with sleep. Has no concerns.   Past Medical History:  Diagnosis Date  . Anxiety   . Depression   . Endometriosis   . Insomnia   . Kidney stones   . Migraines     Past Surgical History:  Procedure Laterality Date  . CESAREAN SECTION     X 2  . ENDOMETRIAL ABLATION    . PELVIC LAPAROSCOPY  2004   Laser Endometriosis  . TONSILLECTOMY  2011  . WISDOM TOOTH EXTRACTION  1990    Family History  Problem Relation Age of Onset  . Migraines Mother   . Hypertension Mother   . Anxiety disorder Mother   . Depression Mother   . Depression Father   . Alcohol abuse Father   . Heart disease Father   . Celiac disease Sister   . Depression Sister   . ADD / ADHD Daughter   . Autism Son   . ADD / ADHD Son   . Alzheimer's disease Maternal Grandmother   . Alzheimer's disease Maternal Grandfather   . Heart attack Paternal Grandmother   . Stroke Paternal Grandfather   . Alzheimer's disease Maternal Aunt        x3    Social History   Socioeconomic History  . Marital status: Significant Other    Spouse name:  Not on file  . Number of children: 2  . Years of education: Not on file  . Highest education level: Not on file  Occupational History  . Not on file  Tobacco Use  . Smoking status: Former Smoker    Packs/day: 0.50    Years: 8.00    Pack years: 4.00    Types: Cigarettes    Quit date: 05/19/2003    Years since quitting: 17.5  . Smokeless tobacco: Never Used  Vaping Use  . Vaping Use: Never used  Substance and Sexual Activity  . Alcohol use: Yes    Alcohol/week: 5.0 standard drinks    Types: 5 Shots of liquor per week  . Drug use: No  . Sexual activity: Yes    Partners: Male    Birth control/protection: None, Surgical    Comment: 1st intercourse 50 yo-More than 5 partners  Other Topics Concern  . Not on file  Social History Narrative   Pt lives in single story home with her significant other and her son   Has 2 children (adult daughter no longer lives in the home)   High school graduate   Works as Scientist, physiological at [employer]    Social Determinants of [employer]  Resource Strain: Not on file  Food Insecurity: Not on file  Transportation Needs: Not on file  Physical Activity: Not on file  Stress: Not on file  Social Connections: Not on file  Intimate Partner Violence: Not on file    Outpatient Medications Prior to Visit  Medication Sig Dispense Refill  . acetaminophen (TYLENOL) 500 MG tablet Take 1,000 mg by mouth every 6 (six) hours as needed for mild pain.    Marland Kitchen ALPRAZolam (XANAX) 1 MG tablet Take 1/2-1 tablet 1 or 2 x /day ONLY if needed for Anxiety Attack &  limit to 5 days /week to avoid addiction. Should last 6 weeks - 08/25/2019 (Patient taking differently: Take 0.5-1 mg by mouth daily as needed for anxiety.) 60 tablet 0  . ALPRAZolam (XANAX) 1 MG tablet Take 1 tablet as needed for severe anxiety. 30 tablet 0  . benzonatate (TESSALON) 100 MG capsule Take 1 capsule (100 mg total) by mouth every 8 (eight) hours. 21 capsule 0  . hydrOXYzine  (ATARAX/VISTARIL) 25 MG tablet Take 1 tablet by mouth every 6-8 hours as needed for anxiety 90 tablet 2  . sertraline (ZOLOFT) 100 MG tablet Take 1 tablet (100 mg total) by mouth daily. 90 tablet 0  . traZODone (DESYREL) 150 MG tablet Take 1 tablet by mouth at bedtime for sleep 90 tablet 3  . ibuprofen (ADVIL) 200 MG tablet Take 600-800 mg by mouth every 6 (six) hours as needed for moderate pain. (Patient not taking: Reported on 11/20/2020)    . HYDROcodone-homatropine (HYCODAN) 5-1.5 MG/5ML syrup Take 5 mLs by mouth every 6 (six) hours as needed for cough. (Patient not taking: Reported on 11/20/2020) 120 mL 0  . ondansetron (ZOFRAN ODT) 4 MG disintegrating tablet Take 1 tablet (4 mg total) by mouth every 8 (eight) hours as needed for nausea or vomiting. (Patient not taking: Reported on 11/20/2020) 10 tablet 0   No facility-administered medications prior to visit.    Allergies  Allergen Reactions  . Sulfa Antibiotics Itching  . Amoxicillin Rash  . Penicillins Rash    ROS Review of Systems A fourteen system review of systems was performed and found to be positive as per HPI.    Objective:    Physical Exam General: Pleasant and cooperative, in no acute distress.  Neuro:  Alert and oriented,  extra-ocular muscles intact  HEENT:  Normocephalic, atraumatic, neck supple Skin:  no gross rash, warm, pink. Cardiac:  RRR, S1 S2 wnl's  Respiratory:  ECTA B/L w/o wheezing, Not using accessory muscles, speaking in full sentences- unlabored. Vascular:  Ext warm, no cyanosis apprec.; cap RF less 2 sec. Psych:  No HI/SI, judgement and insight good, Euthymic mood. Full Affect.  BP 128/80 (BP Location: Left Arm, Patient Position: Sitting)   Pulse 76   Temp 97.6 F (36.4 C) (Temporal)   Ht 5\' 4"  (1.626 m)   Wt 276 lb 8 oz (125.4 kg)   SpO2 94%   BMI 47.46 kg/m  Wt Readings from Last 3 Encounters:  11/20/20 276 lb 8 oz (125.4 kg)  01/28/20 270 lb 6.4 oz (122.7 kg)  12/28/19 280 lb (127 kg)      Health Maintenance Due  Topic Date Due  . COLONOSCOPY (Pts 45-73yrs Insurance coverage will need to be confirmed)  Never done  . PAP SMEAR-Modifier  10/11/2018  . COVID-19 Vaccine (3 - Booster for Pfizer series) 06/15/2020    There are no preventive care reminders to display for this patient.  Lab Results  Component Value Date   TSH 1.31 02/24/2018   Lab Results  Component Value Date   WBC 6.6 12/28/2019   HGB 12.4 12/28/2019   HCT 41.1 12/28/2019   MCV 90.1 12/28/2019   PLT 263 12/28/2019   Lab Results  Component Value Date   NA 140 12/28/2019   K 4.4 12/28/2019   CO2 24 12/28/2019   GLUCOSE 100 (H) 12/28/2019   BUN 11 12/28/2019   CREATININE 0.70 12/28/2019   BILITOT 0.4 02/24/2018   ALKPHOS 83 04/09/2017   AST 22 02/24/2018   ALT 23 02/24/2018   PROT 6.6 02/24/2018   ALBUMIN 3.9 04/09/2017   CALCIUM 8.6 (L) 12/28/2019   ANIONGAP 10 12/28/2019   Lab Results  Component Value Date   CHOL 210 (H) 02/24/2018   Lab Results  Component Value Date   HDL 40 (L) 02/24/2018   Lab Results  Component Value Date   LDLCALC 139 (H) 02/24/2018   Lab Results  Component Value Date   TRIG 174 (H) 02/24/2018   Lab Results  Component Value Date   CHOLHDL 5.3 (H) 02/24/2018   Lab Results  Component Value Date   HGBA1C 5.1 02/24/2018     Assessment & Plan:   Problem List Items Addressed This Visit      Other   Insomnia   Relevant Medications   traZODone (DESYREL) 150 MG tablet   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 -Continue current medication regimen. Provided refill. -PDMP  reviewed, Alprazolam last filled 01/28/20 for Q: 30 tabs. No aberrancies noted, provided refill. -Continue mindfulness therapy with deep breathing and relaxation techniques. -Will continue to monitor.  Insomnia: -Stable. -Continue current medication regimen. Provided refill.  Cough: -Improved. Will provide refill of tessalon.   Meds ordered this encounter  Medications  . ALPRAZolam (XANAX) 1 MG tablet    Sig: Take 0.5-1 tablet as needed for severe anxiety.    Dispense:  30 tablet    Refill:  0    Order Specific Question:   Supervising Provider    Answer:   Nani Gasser D [[id number]]  . sertraline (ZOLOFT) 100 MG tablet    Sig: Take 1 tablet (100 mg total) by mouth daily.    Dispense:  90 tablet    Refill:  1    Order Specific Question:   Supervising Provider    Answer:   Nani Gasser D [[id number]]  . traZODone (DESYREL) 150 MG tablet    Sig: Take 1 tablet by mouth at bedtime for sleep    Dispense:  90 tablet    Refill:  1    Order Specific Question:   Supervising Provider    Answer:   Nani Gasser D [[id number]]  . benzonatate (TESSALON) 100 MG capsule    Sig: Take 1 capsule (100 mg total) by mouth every 8 (eight) hours.    Dispense:  21 capsule    Refill:  0    Order Specific Question:   Supervising Provider    Answer:   Nani Gasser D [[id number]]  Follow-up: Return in about 6 months (around 05/23/2021) for Mood.   Note:  This note was prepared with assistance of Dragon voice recognition software. Occasional wrong-word or sound-a-like substitutions may have occurred due to the inherent limitations of voice recognition software.   Mayer MaskerMaritza Abonza, PA-C

## 2020-11-30 ENCOUNTER — Encounter: Payer: Medicaid Other | Admitting: Adult Health

## 2020-12-13 ENCOUNTER — Encounter: Payer: Medicaid Other | Admitting: Adult Health

## 2021-01-26 ENCOUNTER — Other Ambulatory Visit: Payer: Self-pay | Admitting: Physician Assistant

## 2021-01-26 ENCOUNTER — Telehealth: Payer: Self-pay | Admitting: Physician Assistant

## 2021-01-26 DIAGNOSIS — R059 Cough, unspecified: Secondary | ICD-10-CM

## 2021-01-26 NOTE — Telephone Encounter (Signed)
Patient is aware of this and verbalized understanding. AS, CMA 

## 2021-01-26 NOTE — Telephone Encounter (Signed)
Called back patient to discuss refill and have her schedule an acute visit for future med refills. Patient did not answer and left a voicemail to callback. Faythe Dingwall, CMA

## 2021-01-26 NOTE — Telephone Encounter (Signed)
[facility] needs a refill on [facility]ate [facility] [address].

## 2021-01-31 ENCOUNTER — Ambulatory Visit: Payer: Medicaid Other | Admitting: Nurse Practitioner

## 2021-05-10 ENCOUNTER — Encounter: Payer: Self-pay | Admitting: Nurse Practitioner

## 2021-05-10 ENCOUNTER — Ambulatory Visit (INDEPENDENT_AMBULATORY_CARE_PROVIDER_SITE_OTHER): Payer: Self-pay | Admitting: Nurse Practitioner

## 2021-05-10 VITALS — Temp 99.5°F | Ht 64.0 in | Wt 276.8 lb

## 2021-05-10 DIAGNOSIS — R059 Cough, unspecified: Secondary | ICD-10-CM | POA: Insufficient documentation

## 2021-05-10 DIAGNOSIS — J069 Acute upper respiratory infection, unspecified: Secondary | ICD-10-CM

## 2021-05-10 MED ORDER — BENZONATATE 100 MG PO CAPS: 100.0000 mg | ORAL_CAPSULE | Freq: Three times a day (TID) | ORAL | 0 refills | Status: DC

## 2021-05-10 NOTE — Progress Notes (Signed)
Virtual Visit via Telephone Note  I connected with Marie Ingram on 05/10/21 at  1:10 PM EDT by telephone and verified that I am speaking with the correct person using two identifiers.  Location: Patient: home  Provider: [place] [facility]     I discussed the limitations, risks, security and privacy concerns of performing an evaluation and management service by telephone and the availability of in person appointments. I also discussed with the patient that there may be a patient responsible charge related to this service. The patient expressed understanding and agreed to proceed.   History of Present Illness: The patient states that this morning she woke up with fever, sore throat, headache, and cough.  She states that cough is more than what she is used to experiencing.  She did have a fever of 100.2 this morning.  She did take Tylenol around 6 AM.  She took Tylenol again about an hour ago.  Temperature is now 99.5.  States that she has had some facial flushing and sneezing which started yesterday.  States that she is missing work today.  She did take a COVID-19 test this morning and it was negative.    Observations/Objective:  The patient is alert and oriented. She is pleasant and answers all questions appropriately. Breathing is non-labored. She is in no acute distress at this time.  Patient is nasally congested.  She does have a dry, harsh cough which can be heard during today's phone call.  Today's Vitals   05/10/21 1115  Temp: 99.5 F (37.5 C)  Weight: 276 lb 12.8 oz (125.6 kg)  Height: 5\' 4"  (1.626 m)   Body mass index is 47.51 kg/m.   Assessment and Plan: 1. Upper respiratory tract infection, unspecified type Testing for COVID-19 is negative this morning.  Advised her to quarantine and isolate as though she were positive for COVID.  Advise she stay out of work today and tomorrow.  We will provide a note for her employer with these recommendations.   Recommend she retest for COVID-19 on Saturday or Sunday morning.  Will treat further based on testing results and symptoms development.  For now recommend rest, increase fluids, and over-the-counter medications to help relieve acute symptoms.  Also encouraged her to take vitamin C, vitamin D, and zinc to help boost immune system and fight infection.  She voiced understanding and agreement with this plan.  2. Cough Renewed prescription for Tessalon Perles.  May take up to 3 times daily as needed for cough.  Also recommended over-the-counter Delsym twice daily as needed for cough.  May consider addition of Medrol taper based on symptom development. - benzonatate (TESSALON) 100 MG capsule; Take 1 capsule (100 mg total) by mouth every 8 (eight) hours.  Dispense: 21 capsule; Refill: 0   Follow Up Instructions:    I discussed the assessment and treatment plan with the patient. The patient was provided an opportunity to ask questions and all were answered. The patient agreed with the plan and demonstrated an understanding of the instructions.   The patient was advised to call back or seek an in-person evaluation if the symptoms worsen or if the condition fails to improve as anticipated.  I provided 15 minutes of non-face-to-face time during this encounter.   Friday, NP

## 2021-05-23 ENCOUNTER — Ambulatory Visit: Payer: Medicaid Other | Admitting: Physician Assistant

## 2021-06-05 ENCOUNTER — Other Ambulatory Visit: Payer: Self-pay | Admitting: Physician Assistant

## 2021-06-05 DIAGNOSIS — G4700 Insomnia, unspecified: Secondary | ICD-10-CM

## 2021-08-21 ENCOUNTER — Telehealth: Payer: Medicaid Other | Admitting: Physician Assistant

## 2021-08-21 DIAGNOSIS — R6889 Other general symptoms and signs: Secondary | ICD-10-CM

## 2021-08-21 NOTE — Progress Notes (Signed)

## 2021-08-21 NOTE — Progress Notes (Signed)
I have spent 5 minutes in review of e-visit questionnaire, review and updating patient chart, medical decision making and response to patient.   Finbar Nippert Cody Chalice Philbert, PA-C    

## 2021-09-13 IMAGING — DX DG CHEST 2V
2 series · 2 of 2 positions shown · non-contrast
Comparison: Chest radiographs 11/02/2018 and earlier.

CLINICAL DATA: 48-year-old female with chest pain and heaviness for
3 days progressive last night. Shortness of breath. Left leg pain.

EXAM:
CHEST - 2 VIEW

[w chest pa]
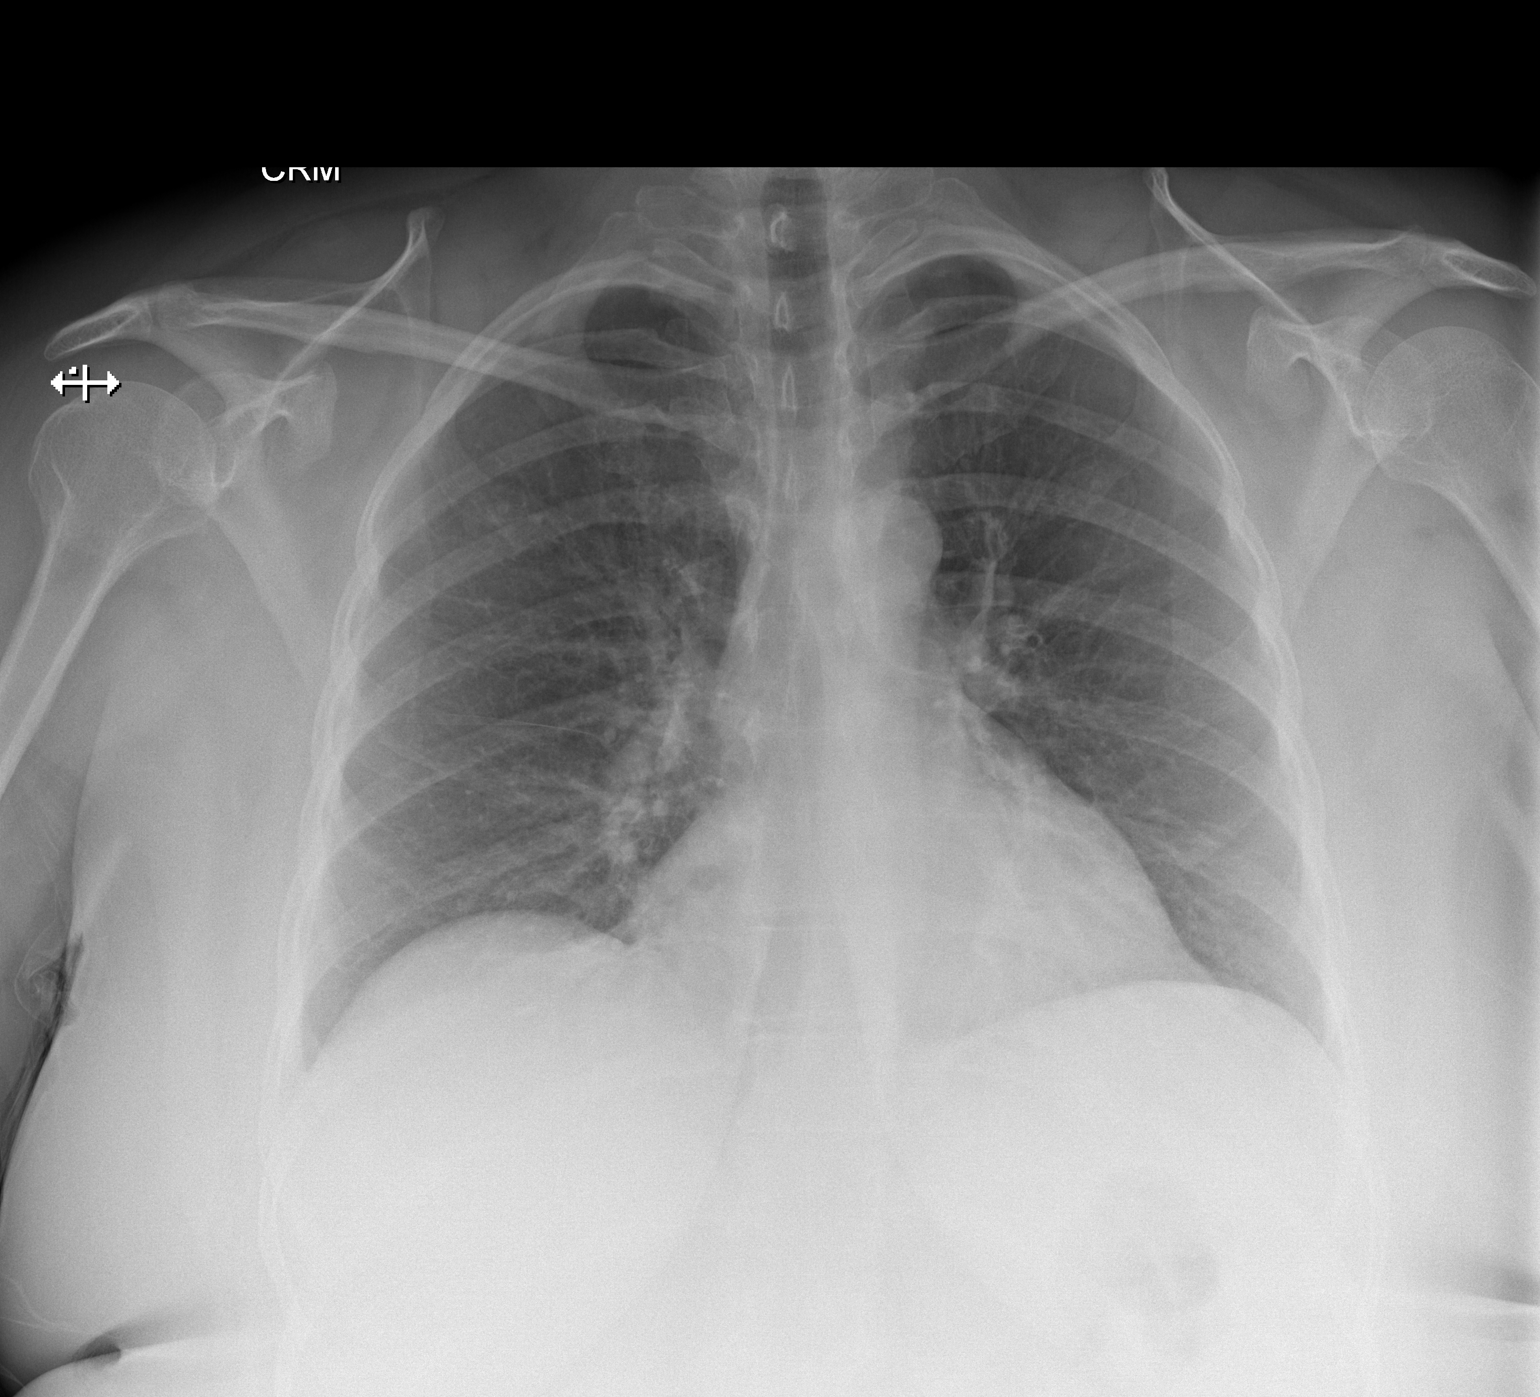

[w chest lat]
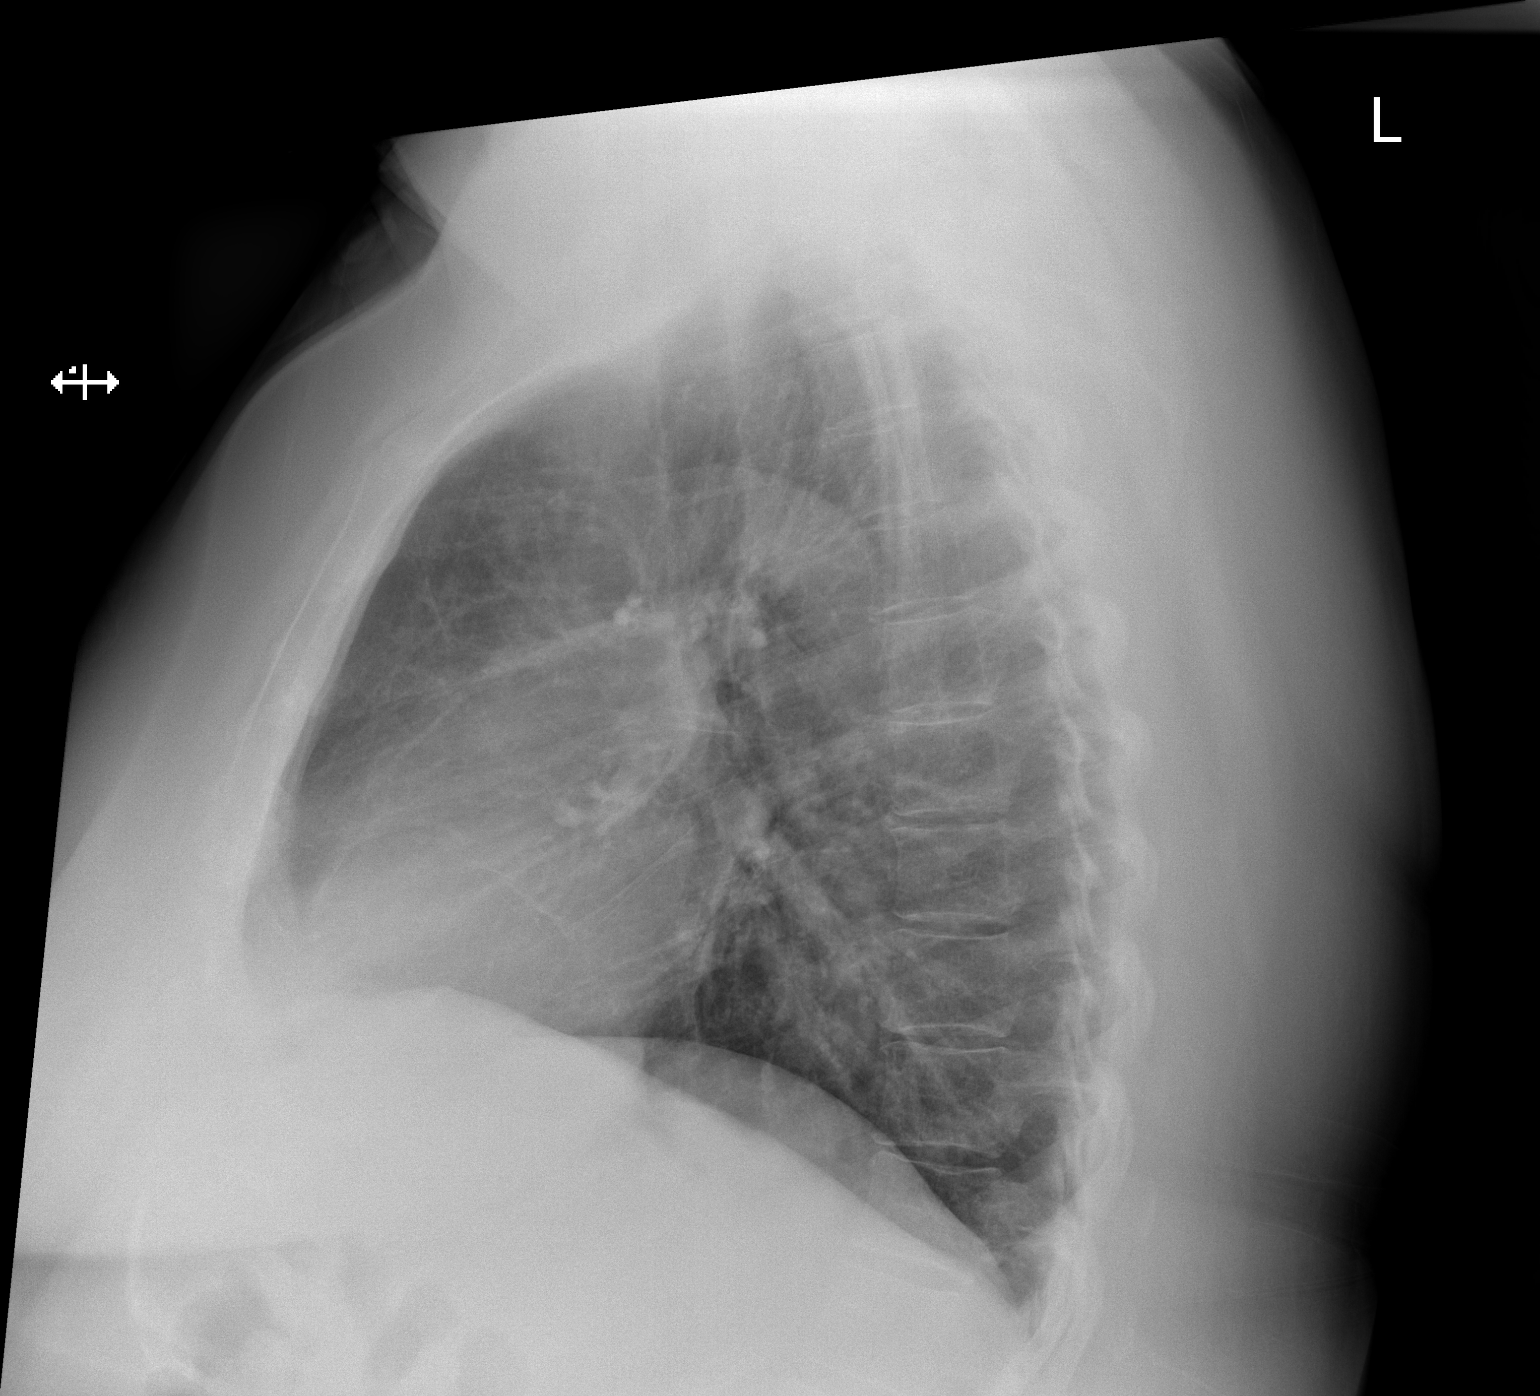

[2 of 2 positions shown; findings below may reference images not displayed]

FINDINGS: Lower lung volumes. Mediastinal contours remain normal. Visualized
tracheal air column is within normal limits. Mild crowding of lung
markings but otherwise both lungs appear clear with no pneumothorax
or pleural effusion. EKG button artifact in both upper lungs. No
acute osseous abnormality identified. Negative visible bowel gas
pattern.
IMPRESSION: Lower lung volumes.  No cardiopulmonary abnormality.

## 2021-10-24 ENCOUNTER — Other Ambulatory Visit: Payer: Self-pay | Admitting: Physician Assistant

## 2021-10-24 DIAGNOSIS — F330 Major depressive disorder, recurrent, mild: Secondary | ICD-10-CM

## 2021-11-07 ENCOUNTER — Other Ambulatory Visit: Payer: Self-pay | Admitting: Physician Assistant

## 2021-11-07 DIAGNOSIS — F330 Major depressive disorder, recurrent, mild: Secondary | ICD-10-CM

## 2021-11-19 ENCOUNTER — Encounter: Payer: Self-pay | Admitting: Physician Assistant

## 2021-12-11 ENCOUNTER — Other Ambulatory Visit: Payer: Self-pay | Admitting: Physician Assistant

## 2021-12-11 DIAGNOSIS — G4700 Insomnia, unspecified: Secondary | ICD-10-CM

## 2021-12-20 ENCOUNTER — Telehealth: Payer: 59 | Admitting: Family Medicine

## 2021-12-20 DIAGNOSIS — J069 Acute upper respiratory infection, unspecified: Secondary | ICD-10-CM

## 2021-12-20 MED ORDER — PROMETHAZINE-DM 6.25-15 MG/5ML PO SYRP: 2.5000 mL | ORAL_SOLUTION | Freq: Four times a day (QID) | ORAL | 0 refills | Status: DC | PRN

## 2021-12-20 MED ORDER — FLUTICASONE PROPIONATE 50 MCG/ACT NA SUSP: 2.0000 | Freq: Every day | NASAL | 0 refills | Status: DC

## 2021-12-20 NOTE — Progress Notes (Signed)
?Virtual Visit Consent  ? ?Marie Ingram, you are scheduled for a virtual visit with a [facility] provider today.   ?  ?Just as with appointments in the office, your consent must be obtained to participate.  Your consent will be active for this visit and any virtual visit you may have with one of our providers in the next 365 days.   ?  ?If you have a MyChart account, a copy of this consent can be sent to you electronically.  All virtual visits are billed to your insurance company just like a traditional visit in the office.   ? ?As this is a virtual visit, video technology does not allow for your provider to perform a traditional examination.  This may limit your provider's ability to fully assess your condition.  If your provider identifies any concerns that need to be evaluated in person or the need to arrange testing (such as labs, EKG, etc.), we will make arrangements to do so.   ?  ?Although advances in technology are sophisticated, we cannot ensure that it will always work on either your end or our end.  If the connection with a video visit is poor, the visit may have to be switched to a telephone visit.  With either a video or telephone visit, we are not always able to ensure that we have a secure connection.    ? ?Also, by engaging in this virtual visit, you consent to the provision of healthcare. Additionally, you authorize for your insurance to be billed (if applicable) for the services provided during this visit.  ? ?I need to obtain your verbal consent now.   Are you willing to proceed with your visit today?  ?  ?CAM HARNDEN has provided verbal consent on 12/20/2021 for a virtual visit (video or telephone). ?  ?Freddy Finner, NP  ? ?Date: 12/20/2021 9:45 AM ? ? ?Virtual Visit via Video Note  ? ?IFreddy Finner, connected with  Marie Ingram  ([id number], [birth year]) on 12/20/21 at  9:45 AM EDT by a video-enabled telemedicine application and verified that I am speaking with the correct person  using two identifiers. ? ?Location: ?Patient: Virtual Visit Location Patient: Home ?Provider: Virtual Visit Location Provider: Home Office ?  ?I discussed the limitations of evaluation and management by telemedicine and the availability of in person appointments. The patient expressed understanding and agreed to proceed.   ? ?History of Present Illness: ?Marie Ingram is a 51 y.o. who identifies as a female who was assigned female at birth, and is being seen today for cough and sinus symptoms.Marie Ingram ?Onset Monday with sore throat, really bad cough, chest burning from coughing so much, having trouble sleeping due to coughing, congestion. Use of OTC day and night time flu meds without much relief. Daughter recently sick as well. Would like note for work. Denies fevers, chills, chest pain, shortness of breath, ear pain. Denies allergy issues. Drug allergies in chart correct.  ? ?Problems:  ?Patient Active Problem List  ? Diagnosis Date Noted  ? Upper respiratory tract infection 05/10/2021  ? Cough 05/10/2021  ? Abdominal hernia without obstruction and without gangrene 02/10/2020  ? Former smoker (quit 2004, 9 pack/year hx) 11/29/2019  ? Hyperlipidemia 09/09/2018  ? ADD (attention deficit disorder) 09/24/2017  ? Other abnormal glucose 10/04/2015  ? Vitamin D deficiency 10/04/2015  ? Morbid obesity with BMI of 45.0-49.9, adult (HCC) 05/27/2014  ? Migraines   ? Kidney stones   ?  Insomnia   ? 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  ?Allergies:  ?Allergies  ?Allergen Reactions  ? Sulfa Antibiotics Itching  ? Amoxicillin Rash  ? Penicillins Rash  ? ?Medications:  ?Current Outpatient Medications:  ?  acetaminophen (TYLENOL) 500 MG tablet, Take 1,000 mg by mouth every 6 (six) hours as needed for mild pain., Disp: , Rfl:  ?  ALPRAZolam (XANAX) 1 MG tablet, Take 0.5-1 tablet as needed for severe anxiety., Disp: 30 tablet, Rfl: 0 ?  benzonatate (TESSALON) 100 MG capsule, Take 1 capsule (100 mg total) by  mouth every 8 (eight) hours., Disp: 21 capsule, Rfl: 0 ?  ibuprofen (ADVIL) 200 MG tablet, Take 600-800 mg by mouth every 6 (six) hours as needed for moderate pain. (Patient not taking: No sig reported), Disp: , Rfl:  ?  sertraline (ZOLOFT) 100 MG tablet, TAKE 1 TABLET (100 MG TOTAL) BY MOUTH DAILY. **NEEDS APT FOR REFILLS**, Disp: 90 tablet, Rfl: 1 ?  traZODone (DESYREL) 150 MG tablet, TAKE 1 TABLET BY MOUTH EVERY DAY AT BEDTIME FOR SLEEP **PLEASE CONTACT OUR OFFICE TO SCHEDULE A FOLLOW UP FOR FUTURE MED REFILLS**, Disp: 30 tablet, Rfl: 0 ? ?Observations/Objective: ?Patient is well-developed, well-nourished in no acute distress.  ?Resting comfortably  at home.  ?Head is normocephalic, atraumatic.  ?No labored breathing.  ?Speech is clear and coherent with logical content.  ?Patient is alert and oriented at baseline.  ?Cough present ?Nasal tone noted ? ?Assessment and Plan: ? ?1. Viral URI with cough ? ?- fluticasone (FLONASE) 50 MCG/ACT nasal spray; Place 2 sprays into both nostrils daily.  Dispense: 16 g; Refill: 0 ?- promethazine-dextromethorphan (PROMETHAZINE-DM) 6.25-15 MG/5ML syrup; Take 2.5 mLs by mouth 4 (four) times daily as needed for cough.  Dispense: 118 mL; Refill: 0 ? ?S&S consistent with viral URI with cough and or possibly allergies given the season ?Cough is worse symptom will provide prometh DM and Flonase to help symptoms. ?Advised to not double up on cough syrup given similar ingredients. ? ?Reviewed side effects, risks and benefits of medication.   ? ?Patient acknowledged agreement and understanding of the plan.  ? ? ?Follow Up Instructions: ?I discussed the assessment and treatment plan with the patient. The patient was provided an opportunity to ask questions and all were answered. The patient agreed with the plan and demonstrated an understanding of the instructions.  A copy of instructions were sent to the patient via MyChart unless otherwise noted below.  ? ? ? ?The patient was advised to  call back or seek an in-person evaluation if the symptoms worsen or if the condition fails to improve as anticipated. ? ?Time:  ?I spent 12 minutes with the patient via telehealth technology discussing the above problems/concerns.   ? ?Freddy Finner, NP ? ?

## 2021-12-20 NOTE — Patient Instructions (Signed)

## 2022-01-09 ENCOUNTER — Other Ambulatory Visit: Payer: Self-pay | Admitting: Physician Assistant

## 2022-01-09 ENCOUNTER — Encounter: Payer: Medicaid Other | Admitting: Physician Assistant

## 2022-01-09 DIAGNOSIS — G4700 Insomnia, unspecified: Secondary | ICD-10-CM

## 2022-01-13 ENCOUNTER — Other Ambulatory Visit: Payer: Self-pay | Admitting: Physician Assistant

## 2022-01-13 DIAGNOSIS — G4700 Insomnia, unspecified: Secondary | ICD-10-CM

## 2022-01-15 ENCOUNTER — Other Ambulatory Visit (HOSPITAL_COMMUNITY)
Admission: RE | Admit: 2022-01-15 | Discharge: 2022-01-15 | Disposition: A | Discharge status: Home / Self Care | Payer: 59 | Source: Ambulatory Visit | Attending: Physician Assistant | Admitting: Physician Assistant

## 2022-01-15 ENCOUNTER — Ambulatory Visit (INDEPENDENT_AMBULATORY_CARE_PROVIDER_SITE_OTHER): Payer: 59 | Admitting: Physician Assistant

## 2022-01-15 ENCOUNTER — Encounter: Payer: Self-pay | Admitting: Physician Assistant

## 2022-01-15 VITALS — BP 132/83 | HR 97 | Temp 97.7°F | Ht 65.0 in | Wt 284.0 lb

## 2022-01-15 DIAGNOSIS — G4700 Insomnia, unspecified: Secondary | ICD-10-CM

## 2022-01-15 DIAGNOSIS — F988 Other specified behavioral and emotional disorders with onset usually occurring in childhood and adolescence: Secondary | ICD-10-CM

## 2022-01-15 DIAGNOSIS — K469 Unspecified abdominal hernia without obstruction or gangrene: Secondary | ICD-10-CM

## 2022-01-15 DIAGNOSIS — E559 Vitamin D deficiency, unspecified: Secondary | ICD-10-CM

## 2022-01-15 DIAGNOSIS — F419 Anxiety disorder, unspecified: Secondary | ICD-10-CM

## 2022-01-15 DIAGNOSIS — Z0000 Encounter for general adult medical examination without abnormal findings: Secondary | ICD-10-CM

## 2022-01-15 DIAGNOSIS — E782 Mixed hyperlipidemia: Secondary | ICD-10-CM | POA: Diagnosis not present

## 2022-01-15 DIAGNOSIS — Z124 Encounter for screening for malignant neoplasm of cervix: Secondary | ICD-10-CM | POA: Diagnosis not present

## 2022-01-15 DIAGNOSIS — Z1231 Encounter for screening mammogram for malignant neoplasm of breast: Secondary | ICD-10-CM | POA: Diagnosis not present

## 2022-01-15 DIAGNOSIS — Z1211 Encounter for screening for malignant neoplasm of colon: Secondary | ICD-10-CM

## 2022-01-15 DIAGNOSIS — Z6841 Body Mass Index (BMI) 40.0 and over, adult: Secondary | ICD-10-CM

## 2022-01-15 DIAGNOSIS — K219 Gastro-esophageal reflux disease without esophagitis: Secondary | ICD-10-CM

## 2022-01-15 MED ORDER — OMEPRAZOLE 20 MG PO CPDR: 20.0000 mg | DELAYED_RELEASE_CAPSULE | Freq: Every day | ORAL | 2 refills | Status: DC

## 2022-01-15 MED ORDER — TRAZODONE HCL 150 MG PO TABS: ORAL_TABLET | ORAL | 1 refills | Status: DC

## 2022-01-15 MED ORDER — ALPRAZOLAM 0.5 MG PO TABS: 0.5000 mg | ORAL_TABLET | Freq: Two times a day (BID) | ORAL | 0 refills | Status: DC | PRN

## 2022-01-15 MED ORDER — LISDEXAMFETAMINE DIMESYLATE 20 MG PO CAPS: 20.0000 mg | ORAL_CAPSULE | Freq: Every day | ORAL | 0 refills | Status: DC

## 2022-01-15 NOTE — Progress Notes (Signed)
? ?Complete physical exam ? ? ?Patient: Marie Ingram   DOB: [birth year]   50 y.o. Female  MRN: [id number] ?Visit Date: 01/15/2022 ? ? ?Chief Complaint  ?Patient presents with  ? Annual Exam  ? ?Subjective  ?  ?Marie Ingram is a 51 y.o. female who presents today for a complete physical exam.  ?She reports consuming a general diet. The patient does not participate in regular exercise at present. She generally feels fairly well. She does have additional problems to discuss today. In the past was on Vyvanse which worked well but lost her insurance and was not able to afford medication. States in the past has tried Adderall and Ritalin which she did not tolerate. Also reports being more anxious and does have more stress. Her son is getting ready to graduate from high school and he has mild autism so is concerned about new adjustments. Also reports having issues with heartburn and burning sensation (acid reflux). ? ? ? ?Past Medical History:  ?Diagnosis Date  ? Anxiety   ? Depression   ? Endometriosis   ? Insomnia   ? Kidney stones   ? Migraines   ? ?Past Surgical History:  ?Procedure Laterality Date  ? CESAREAN SECTION    ? X 2  ? ENDOMETRIAL ABLATION    ? PELVIC LAPAROSCOPY  2004  ? Laser Endometriosis  ? TONSILLECTOMY  2011  ? WISDOM TOOTH EXTRACTION  1990  ? ?Social History  ? ?Socioeconomic History  ? Marital status: Significant Other  ?  Spouse name: Not on file  ? Number of children: 2  ? Years of education: Not on file  ? Highest education level: Not on file  ?Occupational History  ? Not on file  ?Tobacco Use  ? Smoking status: Former  ?  Packs/day: 0.50  ?  Years: 8.00  ?  Pack years: 4.00  ?  Types: Cigarettes  ?  Quit date: 05/19/2003  ?  Years since quitting: 18.6  ? Smokeless tobacco: Never  ?Vaping Use  ? Vaping Use: Never used  ?Substance and Sexual Activity  ? Alcohol use: Yes  ?  Alcohol/week: 5.0 standard drinks  ?  Types: 5 Shots of liquor per week  ? Drug use: No  ? Sexual activity: Yes  ?   Partners: Male  ?  Birth control/protection: None, Surgical  ?  Comment: 1st intercourse 51 yo-More than 5 partners  ?Other Topics Concern  ? Not on file  ?Social History Narrative  ? Pt lives in single story home with her significant other and her son  ? Has 2 children (adult daughter no longer lives in the home)  ? High school graduate  ? Works as Scientist, physiologicalreceptionist at [employer]   ? ?Social Determinants of Health  ? ?Financial Resource Strain: Not on file  ?Food Insecurity: Not on file  ?Transportation Needs: Not on file  ?Physical Activity: Not on file  ?Stress: Not on file  ?Social Connections: Not on file  ?Intimate Partner Violence: Not on file  ? ? ? ?Medications: ?Outpatient Medications Prior to Visit  ?Medication Sig  ? acetaminophen (TYLENOL) 500 MG tablet Take 1,000 mg by mouth every 6 (six) hours as needed for mild pain.  ? benzonatate (TESSALON) 100 MG capsule Take 1 capsule (100 mg total) by mouth every 8 (eight) hours.  ? fluticasone (FLONASE) 50 MCG/ACT nasal spray Place 2 sprays into both nostrils daily.  ? ibuprofen (ADVIL) 200 MG tablet Take 600-800 mg  by mouth every 6 (six) hours as needed for moderate pain.  ? promethazine-dextromethorphan (PROMETHAZINE-DM) 6.25-15 MG/5ML syrup Take 2.5 mLs by mouth 4 (four) times daily as needed for cough.  ? sertraline (ZOLOFT) 100 MG tablet TAKE 1 TABLET (100 MG TOTAL) BY MOUTH DAILY. **NEEDS APT FOR REFILLS**  ? [DISCONTINUED] traZODone (DESYREL) 150 MG tablet TAKE 1 TABLET BY MOUTH EVERY DAY AT BEDTIME FOR SLEEP **PLEASE CONTACT OUR OFFICE TO SCHEDULE A FOLLOW UP FOR FUTURE MED REFILLS**  ? [DISCONTINUED] ALPRAZolam (XANAX) 1 MG tablet Take 0.5-1 tablet as needed for severe anxiety.  ? ?No facility-administered medications prior to visit.  ? ? ?Review of Systems ?Review of Systems:  ?A fourteen system review of systems was performed and found to be positive as per HPI. ? ?Last CBC ?Lab Results  ?Component Value Date  ? WBC 6.6 12/28/2019  ?  HGB 12.4 12/28/2019  ? HCT 41.1 12/28/2019  ? MCV 90.1 12/28/2019  ? MCH 27.2 12/28/2019  ? RDW 15.1 12/28/2019  ? PLT 263 12/28/2019  ? ?Last metabolic panel ?Lab Results  ?Component Value Date  ? GLUCOSE 100 (H) 12/28/2019  ? NA 140 12/28/2019  ? K 4.4 12/28/2019  ? CL 106 12/28/2019  ? CO2 24 12/28/2019  ? BUN 11 12/28/2019  ? CREATININE 0.70 12/28/2019  ? GFRNONAA >60 12/28/2019  ? CALCIUM 8.6 (L) 12/28/2019  ? PROT 6.6 02/24/2018  ? ALBUMIN 3.9 04/09/2017  ? BILITOT 0.4 02/24/2018  ? ALKPHOS 83 04/09/2017  ? AST 22 02/24/2018  ? ALT 23 02/24/2018  ? ANIONGAP 10 12/28/2019  ? ?Last lipids ?Lab Results  ?Component Value Date  ? CHOL 210 (H) 02/24/2018  ? HDL 40 (L) 02/24/2018  ? LDLCALC 139 (H) 02/24/2018  ? TRIG 174 (H) 02/24/2018  ? CHOLHDL 5.3 (H) 02/24/2018  ? ?Last hemoglobin A1c ?Lab Results  ?Component Value Date  ? HGBA1C 5.1 02/24/2018  ? ?Last thyroid functions ?Lab Results  ?Component Value Date  ? TSH 1.31 02/24/2018  ? ?Last vitamin D ?Lab Results  ?Component Value Date  ? VD25OH 28 (L) 02/24/2018  ? ? Objective  ? ?  ?BP 132/83   Pulse 97   Temp 97.7 ?F (36.5 ?C)   Ht 5\' 5"  (1.651 m)   Wt 284 lb (128.8 kg)   SpO2 96%   BMI 47.26 kg/m?  ?BP Readings from Last 3 Encounters:  ?01/15/22 132/83  ?11/20/20 128/80  ?01/28/20 125/81  ? ?Wt Readings from Last 3 Encounters:  ?01/15/22 284 lb (128.8 kg)  ?05/10/21 276 lb 12.8 oz (125.6 kg)  ?11/20/20 276 lb 8 oz (125.4 kg)  ? ? ?Physical Exam  ? ?General Appearance:     Alert, cooperative, in no acute distress, appears stated age   ?Head:    Normocephalic, without obvious abnormality, atraumatic  ?Eyes:    PERRL, conjunctiva/corneas clear, EOM's intact, fundi  ?  benign, both eyes  ?Ears:    Normal TM's and external ear canals, both ears  ?Nose:   Nares normal, septum midline, mucosa normal, no drainage  ?  or sinus tenderness  ?Throat:   Lips, mucosa, and tongue normal; teeth and gums normal  ?Neck:   Supple, symmetrical, trachea midline, no adenopathy;   ?  thyroid:  no enlargement/tenderness/nodules; no carotid ?  bruit or JVD  ?Back:     Symmetric, no curvature, ROM normal, no CVA tenderness  ?Lungs:     Clear to auscultation bilaterally, respirations unlabored  ?Chest Wall:  No tenderness or deformity  ? Heart:    Normal heart rate. Normal rhythm. No murmurs, rubs, or gallops.   ?Breast Exam:    Inspection negative, No nipple retraction or dimpling, Normal to palpation without dominant masses, deferred  ?Abdomen:     Soft, non-tender, bowel sounds active all four quadrants, hernia below umbilicus noted, no organomegaly  ?Pelvic:    external genitalia normal, no adnexal masses or tenderness, no cervical motion tenderness, and positive findings: vaginal discharge:  white or vaginal mucosa atrophic  ?Extremities:   All extremities are intact. No cyanosis or edema  ?Pulses:   2+ and symmetric all extremities  ?Skin:   Skin color, texture, turgor normal, no rashes or lesions  ?Lymph nodes:   Cervical and supraclavicular nodes normal  ?Neurologic:   CNII-XII grossly intact.  ? ? ? ?Last depression screening scores ? ?  01/15/2022  ?  3:39 PM 05/10/2021  ? 11:18 AM 11/20/2020  ? 10:26 AM  ?PHQ 2/9 Scores  ?PHQ - 2 Score 6 0 2  ?PHQ- 9 Score 21 3 5   ? ?Last fall risk screening ? ?  01/15/2022  ?  3:39 PM  ?Fall Risk   ?Falls in the past year? 0  ?Number falls in past yr: 0  ?Injury with Fall? 0  ?Risk for fall due to : No Fall Risks  ?Follow up Falls evaluation completed  ? ? ? ?No results found for any visits on 01/15/22. ? Assessment & Plan  ?  ?Routine Health Maintenance and Physical Exam ? ?Exercise Activities and Dietary recommendations ?-Discussed heart healthy diet low in fat and carbohydrates.  ? ?Immunization History  ?Administered Date(s) Administered  ? Influenza,inj,Quad PF,6+ Mos 06/04/2016  ? Influenza-Unspecified 05/29/2017, 06/09/2018  ? PFIZER(Purple Top)SARS-COV-2 Vaccination 11/17/2019, 12/15/2019  ? Pneumococcal-Unspecified 09/02/1996  ? Td 10/05/2002   ? ? ?Health Maintenance  ?Topic Date Due  ? COLONOSCOPY (Pts 45-28yrs Insurance coverage will need to be confirmed)  Never done  ? PAP SMEAR-Modifier  10/11/2018  ? COVID-19 Vaccine (3 - Booster for 12/10/2018 serie

## 2022-01-15 NOTE — Patient Instructions (Signed)

## 2022-01-16 ENCOUNTER — Encounter: Payer: Self-pay | Admitting: Physician Assistant

## 2022-01-17 LAB — CYTOLOGY - PAP
Comment: NEGATIVE
Diagnosis: NEGATIVE
High risk HPV: NEGATIVE

## 2022-01-29 ENCOUNTER — Other Ambulatory Visit: Payer: Self-pay | Admitting: Physician Assistant

## 2022-01-29 DIAGNOSIS — Z0000 Encounter for general adult medical examination without abnormal findings: Secondary | ICD-10-CM

## 2022-01-29 DIAGNOSIS — E559 Vitamin D deficiency, unspecified: Secondary | ICD-10-CM

## 2022-01-29 DIAGNOSIS — E782 Mixed hyperlipidemia: Secondary | ICD-10-CM

## 2022-01-30 ENCOUNTER — Other Ambulatory Visit: Payer: 59

## 2022-01-30 DIAGNOSIS — E559 Vitamin D deficiency, unspecified: Secondary | ICD-10-CM

## 2022-01-30 DIAGNOSIS — E782 Mixed hyperlipidemia: Secondary | ICD-10-CM

## 2022-01-31 LAB — COMPREHENSIVE METABOLIC PANEL
ALT: 18 IU/L (ref 0–32)
AST: 16 IU/L (ref 0–40)
Albumin/Globulin Ratio: 1.9 (ref 1.2–2.2)
Albumin: 4.1 g/dL (ref 3.8–4.8)
Alkaline Phosphatase: 113 IU/L (ref 44–121)
BUN/Creatinine Ratio: 16 (ref 9–23)
BUN: 12 mg/dL (ref 6–24)
Bilirubin Total: 0.2 mg/dL (ref 0.0–1.2)
CO2: 24 mmol/L (ref 20–29)
Calcium: 9.2 mg/dL (ref 8.7–10.2)
Chloride: 104 mmol/L (ref 96–106)
Creatinine, Ser: 0.73 mg/dL (ref 0.57–1.00)
Globulin, Total: 2.2 g/dL (ref 1.5–4.5)
Glucose: 95 mg/dL (ref 70–99)
Potassium: 4.5 mmol/L (ref 3.5–5.2)
Sodium: 143 mmol/L (ref 134–144)
Total Protein: 6.3 g/dL (ref 6.0–8.5)
eGFR: 100 mL/min/{1.73_m2} (ref >59)

## 2022-01-31 LAB — LIPID PANEL
Chol/HDL Ratio: 4.4 ratio (ref 0.0–4.4)
Cholesterol, Total: 251 mg/dL — ABNORMAL HIGH (ref 100–199)
HDL: 57 mg/dL (ref >39)
LDL Chol Calc (NIH): 164 mg/dL — ABNORMAL HIGH (ref 0–99)
Triglycerides: 164 mg/dL — ABNORMAL HIGH (ref 0–149)
VLDL Cholesterol Cal: 30 mg/dL (ref 5–40)

## 2022-01-31 LAB — TSH: TSH: 2.05 u[IU]/mL (ref 0.450–4.500)

## 2022-01-31 LAB — CBC
Hematocrit: 38.5 % (ref 34.0–46.6)
Hemoglobin: 12.4 g/dL (ref 11.1–15.9)
MCH: 27.4 pg (ref 26.6–33.0)
MCHC: 32.2 g/dL (ref 31.5–35.7)
MCV: 85 fL (ref 79–97)
Platelets: 264 10*3/uL (ref 150–450)
RBC: 4.52 x10E6/uL (ref 3.77–5.28)
RDW: 14.6 % (ref 11.7–15.4)
WBC: 7.6 10*3/uL (ref 3.4–10.8)

## 2022-01-31 LAB — HEMOGLOBIN A1C
Est. average glucose Bld gHb Est-mCnc: 108 mg/dL
Hgb A1c MFr Bld: 5.4 % (ref 4.8–5.6)

## 2022-01-31 LAB — VITAMIN D 25 HYDROXY (VIT D DEFICIENCY, FRACTURES): Vit D, 25-Hydroxy: 8.8 ng/mL — ABNORMAL LOW (ref 30.0–100.0)

## 2022-02-01 ENCOUNTER — Other Ambulatory Visit: Payer: Self-pay | Admitting: Physician Assistant

## 2022-02-01 DIAGNOSIS — E559 Vitamin D deficiency, unspecified: Secondary | ICD-10-CM

## 2022-02-01 MED ORDER — VITAMIN D (ERGOCALCIFEROL) 1.25 MG (50000 UNIT) PO CAPS: 50000.0000 [IU] | ORAL_CAPSULE | ORAL | 0 refills | Status: DC

## 2022-02-28 ENCOUNTER — Encounter (HOSPITAL_BASED_OUTPATIENT_CLINIC_OR_DEPARTMENT_OTHER): Payer: Self-pay

## 2022-02-28 ENCOUNTER — Emergency Department (HOSPITAL_BASED_OUTPATIENT_CLINIC_OR_DEPARTMENT_OTHER)
Admission: EM | Admit: 2022-02-28 | Discharge: 2022-02-28 | Disposition: A | Discharge status: Home / Self Care | Payer: 59 | Attending: Emergency Medicine | Admitting: Emergency Medicine

## 2022-02-28 ENCOUNTER — Emergency Department (HOSPITAL_COMMUNITY): Payer: 59

## 2022-02-28 ENCOUNTER — Emergency Department (HOSPITAL_BASED_OUTPATIENT_CLINIC_OR_DEPARTMENT_OTHER): Payer: 59

## 2022-02-28 DIAGNOSIS — R1011 Right upper quadrant pain: Secondary | ICD-10-CM | POA: Diagnosis present

## 2022-02-28 DIAGNOSIS — K429 Umbilical hernia without obstruction or gangrene: Secondary | ICD-10-CM | POA: Insufficient documentation

## 2022-02-28 LAB — URINALYSIS, MICROSCOPIC (REFLEX)

## 2022-02-28 LAB — PREGNANCY, URINE: Preg Test, Ur: NEGATIVE

## 2022-02-28 LAB — COMPREHENSIVE METABOLIC PANEL
ALT: 13 U/L (ref 0–44)
AST: 19 U/L (ref 15–41)
Albumin: 3.8 g/dL (ref 3.5–5.0)
Alkaline Phosphatase: 79 U/L (ref 38–126)
Anion gap: 9 (ref 5–15)
BUN: 11 mg/dL (ref 6–20)
CO2: 23 mmol/L (ref 22–32)
Calcium: 8.9 mg/dL (ref 8.9–10.3)
Chloride: 106 mmol/L (ref 98–111)
Creatinine, Ser: 0.67 mg/dL (ref 0.44–1.00)
GFR, Estimated: >60 mL/min (ref >60)
Glucose, Bld: 89 mg/dL (ref 70–99)
Potassium: 3.9 mmol/L (ref 3.5–5.1)
Sodium: 138 mmol/L (ref 135–145)
Total Bilirubin: 0.6 mg/dL (ref 0.3–1.2)
Total Protein: 7 g/dL (ref 6.5–8.1)

## 2022-02-28 LAB — CBC
HCT: 39.9 % (ref 36.0–46.0)
Hemoglobin: 12.5 g/dL (ref 12.0–15.0)
MCH: 28 pg (ref 26.0–34.0)
MCHC: 31.3 g/dL (ref 30.0–36.0)
MCV: 89.3 fL (ref 80.0–100.0)
Platelets: 269 10*3/uL (ref 150–400)
RBC: 4.47 MIL/uL (ref 3.87–5.11)
RDW: 15.8 % — ABNORMAL HIGH (ref 11.5–15.5)
WBC: 7.6 10*3/uL (ref 4.0–10.5)
nRBC: 0 % (ref 0.0–0.2)

## 2022-02-28 LAB — URINALYSIS, ROUTINE W REFLEX MICROSCOPIC
Bilirubin Urine: NEGATIVE
Glucose, UA: NEGATIVE mg/dL
Ketones, ur: NEGATIVE mg/dL
Leukocytes,Ua: NEGATIVE
Nitrite: NEGATIVE
Protein, ur: NEGATIVE mg/dL
Specific Gravity, Urine: 1.02 (ref 1.005–1.030)
pH: 6 (ref 5.0–8.0)

## 2022-02-28 LAB — LIPASE, BLOOD: Lipase: 26 U/L (ref 11–51)

## 2022-02-28 MED ORDER — LIDOCAINE 5 % EX PTCH: 1.0000 | MEDICATED_PATCH | CUTANEOUS | 0 refills | Status: DC

## 2022-02-28 MED ORDER — IBUPROFEN 800 MG PO TABS: 800.0000 mg | ORAL_TABLET | Freq: Three times a day (TID) | ORAL | 0 refills | Status: DC | PRN

## 2022-02-28 NOTE — ED Notes (Signed)
Ultrasound at bedside

## 2022-02-28 NOTE — Discharge Instructions (Addendum)
Today you were seen in the emergency room for abdominal pain in the right upper abdomen.  We collected lab work which was all normal, which rules out pancreatitis. Your ultrasound of the abdomen was negative for things like gallstones; however, it did show some fatty infiltration of the liver.  This could be because of fatty liver disease or early cirrhosis of the liver.   We recommend close follow-up with your primary care doctor to monitor this.  I have written a prescription for lidocaine patches that you may apply over the abdomen to provide pain relief.

## 2022-02-28 NOTE — ED Notes (Signed)
Pt states she drinks a few mixed drinks daily

## 2022-02-28 NOTE — ED Provider Notes (Signed)
[facility] EMERGENCY DEPARTMENT Provider Note   CSN: [id number] Arrival date & time: 02/28/22  1000  History  Chief Complaint  Patient presents with   Abdominal Pain   Marie Ingram is a 51 y.o. female.  51 year old female presents with 4 days of worsening right upper quadrant pain.  She reports this started Monday night with a dull ache in the right upper quadrant, has now progressed to intermittent sharp stabbing pains.  Worse after eating.  At baseline has dull ache that feels like an uncomfortable pressure.  Difficult to become comfortable last night for sleep, she required frequent repositioning.  She does have abdominal pain with movement and repositioning herself.  Denies recent sick symptoms including fever, chills, rhinorrhea, cough, shortness of breath, dysuria.  No hematuria or hematochezia.  Does endorse intermittent loose stools but would not characterize as diarrhea.  No sick contacts at home.  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  She reports only abdominal surgery was a laparoscopic endometriosis surgery; she still has her gallbladder, appendix, and uterus.   Home Medications Prior to Admission medications   Medication Sig Start Date End Date Taking? Authorizing Provider  acetaminophen (TYLENOL) 500 MG tablet Take 1,000 mg by mouth every 6 (six) hours as needed for mild pain.    [provider]  ALPRAZolam Prudy Feeler) 0.5 MG tablet Take 1 tablet (0.5 mg total) by mouth 2 (two) times daily as needed for anxiety. 01/15/22   Mayer Masker, PA-C  benzonatate (TESSALON) 100 MG capsule Take 1 capsule (100 mg total) by mouth every 8 (eight) hours. 05/10/21   Carlean Jews, NP  fluticasone (FLONASE) 50 MCG/ACT nasal spray Place 2 sprays into both nostrils daily. 12/20/21   Freddy Finner, NP  ibuprofen (ADVIL) 200 MG tablet Take 600-800 mg by mouth every 6 (six)  hours as needed for moderate pain.    [provider]  lisdexamfetamine (VYVANSE) 20 MG capsule Take 1 capsule (20 mg total) by mouth daily. 01/15/22   Mayer Masker, PA-C  omeprazole (PRILOSEC) 20 MG capsule Take 1 capsule (20 mg total) by mouth daily. 01/15/22   Mayer Masker, PA-C  promethazine-dextromethorphan (PROMETHAZINE-DM) 6.25-15 MG/5ML syrup Take 2.5 mLs by mouth 4 (four) times daily as needed for cough. 12/20/21   Freddy Finner, NP  sertraline (ZOLOFT) 100 MG tablet TAKE 1 TABLET (100 MG TOTAL) BY MOUTH DAILY. **NEEDS APT FOR REFILLS** 11/08/21   Abonza, Maritza, PA-C  traZODone (DESYREL) 150 MG tablet TAKE 1 TABLET BY MOUTH EVERY DAY AT BEDTIME FOR SLEEP 01/15/22   Abonza, Maritza, PA-C  Vitamin D, Ergocalciferol, (DRISDOL) 1.25 MG (50000 UNIT) CAPS capsule Take 1 capsule (50,000 Units total) by mouth every 7 (seven) days. 02/01/22   Mayer Masker, PA-C     Allergies    Sulfa antibiotics, Amoxicillin, and Penicillins    Review of Systems   Review of Systems  Constitutional:  Negative for appetite change, chills, diaphoresis, fatigue and fever.  HENT:  Negative for congestion and rhinorrhea.   Respiratory:  Negative for cough, shortness of breath and wheezing.   Cardiovascular:  Negative for chest pain and palpitations.  Gastrointestinal:  Positive for abdominal pain (RUQ) and nausea. Negative for blood in stool, constipation, diarrhea and vomiting.  Genitourinary:  Negative for hematuria.  Skin:  Negative for rash.  Neurological:  Positive for headaches (At baseline). Negative for weakness, light-headedness and numbness.  Physical Exam Updated Vital Signs BP (!) 149/88 (BP Location: Right Arm)   Pulse 80   Temp 97.7 F (36.5 C) (Oral)   Resp 18   Ht 5\' 5"  (1.651 m)   Wt 127 kg   SpO2 96%   BMI 46.59 kg/m  Physical Exam Vitals and nursing note reviewed.  Constitutional:      General: She is not in acute distress.    Appearance: She is obese. She is not  ill-appearing, toxic-appearing or diaphoretic.  HENT:     Head: Normocephalic.  Eyes:     General: No scleral icterus.    Extraocular Movements: Extraocular movements intact.  Cardiovascular:     Rate and Rhythm: Normal rate and regular rhythm.     Heart sounds: Normal heart sounds.  Pulmonary:     Effort: Pulmonary effort is normal.     Breath sounds: Normal breath sounds.  Abdominal:     General: Bowel sounds are normal. There is no distension or abdominal bruit. There are no signs of injury.     Palpations: Abdomen is soft. There is no shifting dullness, hepatomegaly or splenomegaly.     Tenderness: There is abdominal tenderness (mild epigastric). There is no guarding or rebound. Negative signs include Murphy's sign and McBurney's sign.     Hernia: A hernia (inferior umbilical) is present.  Skin:    General: Skin is warm.     Capillary Refill: Capillary refill takes less than 2 seconds.  Neurological:     General: No focal deficit present.     Mental Status: She is alert and oriented to person, place, and time.  Psychiatric:        Mood and Affect: Mood normal.        Behavior: Behavior normal.    ED Results / Procedures / Treatments   Labs (all labs ordered are listed, but only abnormal results are displayed) Labs Reviewed  CBC - Abnormal; Notable for the following components:      Result Value   RDW 15.8 (*)    All other components within normal limits  LIPASE, BLOOD  COMPREHENSIVE METABOLIC PANEL  URINALYSIS, ROUTINE W REFLEX MICROSCOPIC  PREGNANCY, URINE   EKG None  Radiology No results found.  Procedures Procedures   Medications Ordered in ED Medications - No data to display  ED Course/ Medical Decision Making/ A&P                           Medical Decision Making 51 year old female presents with 4 days of worsening RUQ pain.  Differential includes cholecystitis, cholangitis, pancreatitis, appendicitis.  Right upper quadrant ultrasound negative for  cholecystitis but does show fatty infiltration of liver, possibly fatty liver disease versus early cirrhosis.  Lipase negative for pancreatitis.  All other labs including CBC and CMP unremarkable.  Pain more likely due to MSK etiology of abdominal wall.  Rx lidocaine patch, recommend close follow-up with PCP.  Return precautions discussed, see AVS for more.  Amount and/or Complexity of Data Reviewed Labs: ordered.    Details: CBC, CMP, lipase, mag Radiology: ordered.    Details: Right upper quadrant ultrasound  Risk Prescription drug management.  Final Clinical Impression(s) / ED Diagnoses Final diagnoses:  None   Rx / DC Orders ED Discharge Orders     None      44, MD   Fayette Pho, MD 02/28/22 1142    03/02/22 P, DO 03/08/22 [phone]

## 2022-02-28 NOTE — ED Triage Notes (Signed)
C/o RUQ abdominal pain x 2 days. Known hernia in RLQ. Nausea this am.

## 2022-03-13 ENCOUNTER — Ambulatory Visit: Payer: 59 | Admitting: Nurse Practitioner

## 2022-04-12 ENCOUNTER — Other Ambulatory Visit: Payer: Self-pay | Admitting: Physician Assistant

## 2022-04-12 DIAGNOSIS — K219 Gastro-esophageal reflux disease without esophagitis: Secondary | ICD-10-CM

## 2022-04-17 ENCOUNTER — Ambulatory Visit: Payer: 59 | Admitting: Physician Assistant

## 2022-07-12 ENCOUNTER — Other Ambulatory Visit: Payer: Self-pay | Admitting: Physician Assistant

## 2022-07-12 DIAGNOSIS — K219 Gastro-esophageal reflux disease without esophagitis: Secondary | ICD-10-CM

## 2022-07-12 DIAGNOSIS — G4700 Insomnia, unspecified: Secondary | ICD-10-CM

## 2022-07-12 NOTE — Telephone Encounter (Signed)
Needs an appointment.

## 2022-07-21 ENCOUNTER — Other Ambulatory Visit: Payer: Self-pay | Admitting: Physician Assistant

## 2022-07-21 DIAGNOSIS — F330 Major depressive disorder, recurrent, mild: Secondary | ICD-10-CM

## 2022-07-22 NOTE — Telephone Encounter (Signed)
Office visit required for future refills 

## 2022-08-02 DIAGNOSIS — Z419 Encounter for procedure for purposes other than remedying health state, unspecified: Secondary | ICD-10-CM | POA: Diagnosis not present

## 2022-08-05 ENCOUNTER — Other Ambulatory Visit: Payer: Self-pay | Admitting: Nurse Practitioner

## 2022-08-05 DIAGNOSIS — F330 Major depressive disorder, recurrent, mild: Secondary | ICD-10-CM

## 2022-08-07 ENCOUNTER — Ambulatory Visit: Payer: Medicaid Other | Admitting: Nurse Practitioner

## 2022-09-02 DIAGNOSIS — Z419 Encounter for procedure for purposes other than remedying health state, unspecified: Secondary | ICD-10-CM | POA: Diagnosis not present

## 2022-09-16 ENCOUNTER — Ambulatory Visit (INDEPENDENT_AMBULATORY_CARE_PROVIDER_SITE_OTHER): Payer: Medicaid Other | Admitting: Nurse Practitioner

## 2022-09-16 ENCOUNTER — Encounter: Payer: Self-pay | Admitting: Nurse Practitioner

## 2022-09-16 VITALS — BP 140/90 | HR 95 | Ht 65.0 in | Wt 293.8 lb

## 2022-09-16 DIAGNOSIS — Z6841 Body Mass Index (BMI) 40.0 and over, adult: Secondary | ICD-10-CM | POA: Diagnosis not present

## 2022-09-16 DIAGNOSIS — M25562 Pain in left knee: Secondary | ICD-10-CM

## 2022-09-16 DIAGNOSIS — G4700 Insomnia, unspecified: Secondary | ICD-10-CM

## 2022-09-16 DIAGNOSIS — K219 Gastro-esophageal reflux disease without esophagitis: Secondary | ICD-10-CM

## 2022-09-16 DIAGNOSIS — F330 Major depressive disorder, recurrent, mild: Secondary | ICD-10-CM

## 2022-09-16 DIAGNOSIS — F419 Anxiety disorder, unspecified: Secondary | ICD-10-CM

## 2022-09-16 DIAGNOSIS — G8929 Other chronic pain: Secondary | ICD-10-CM | POA: Diagnosis not present

## 2022-09-16 MED ORDER — DICLOFENAC SODIUM 75 MG PO TBEC: 75.0000 mg | DELAYED_RELEASE_TABLET | Freq: Two times a day (BID) | ORAL | 1 refills | Status: DC

## 2022-09-16 MED ORDER — OMEPRAZOLE 20 MG PO CPDR: 20.0000 mg | DELAYED_RELEASE_CAPSULE | Freq: Every day | ORAL | 3 refills | Status: DC

## 2022-09-16 MED ORDER — TRAZODONE HCL 150 MG PO TABS: ORAL_TABLET | ORAL | 3 refills | Status: DC

## 2022-09-16 MED ORDER — ALPRAZOLAM 0.5 MG PO TABS: 0.5000 mg | ORAL_TABLET | Freq: Two times a day (BID) | ORAL | 0 refills | Status: DC | PRN

## 2022-09-16 MED ORDER — SERTRALINE HCL 100 MG PO TABS: 100.0000 mg | ORAL_TABLET | Freq: Every day | ORAL | 3 refills | Status: DC

## 2022-09-16 NOTE — Progress Notes (Signed)
Established patient visit   Patient: Marie Ingram   DOB: [birth year]   52 y.o. Female  MRN: [id number] Visit Date: 09/16/2022   Chief Complaint  Patient presents with   Follow-up   Knee Pain   Subjective    HPI  Follow up  -mood --taking sertraline daily  --takes trazodone at night  --takes vyvanse 20 mg daily ---had to stop taking vyvanse as it gave her palpitations  Continues to have Left knee pain.  -does have appointment with Dr. Rush Farmer next week for further evaluation.  --she needs to have referral for this.  -due for screening colonoscopy  -due for screening mammogram  -?flu vaccine  Medications: Outpatient Medications Prior to Visit  Medication Sig   acetaminophen (TYLENOL) 500 MG tablet Take 1,000 mg by mouth every 6 (six) hours as needed for mild pain.   benzonatate (TESSALON) 100 MG capsule Take 1 capsule (100 mg total) by mouth every 8 (eight) hours.   fluticasone (FLONASE) 50 MCG/ACT nasal spray Place 2 sprays into both nostrils daily.   lidocaine (LIDODERM) 5 % Place 1 patch onto the skin daily. Remove & Discard patch within 12 hours or as directed by MD   [DISCONTINUED] ALPRAZolam (XANAX) 0.5 MG tablet Take 1 tablet (0.5 mg total) by mouth 2 (two) times daily as needed for anxiety.   [DISCONTINUED] ibuprofen (ADVIL) 200 MG tablet Take 600-800 mg by mouth every 6 (six) hours as needed for moderate pain.   [DISCONTINUED] ibuprofen (ADVIL) 800 MG tablet Take 1 tablet (800 mg total) by mouth every 8 (eight) hours as needed for moderate pain.   [DISCONTINUED] omeprazole (PRILOSEC) 20 MG capsule TAKE 1 CAPSULE BY MOUTH EVERY DAY   [DISCONTINUED] sertraline (ZOLOFT) 100 MG tablet TAKE 1 TABLET (100 MG TOTAL) BY MOUTH DAILY. **NEEDS APT FOR REFILLS**   [DISCONTINUED] traZODone (DESYREL) 150 MG tablet TAKE 1 TABLET BY MOUTH EVERY DAY AT BEDTIME FOR SLEEP   [DISCONTINUED] lisdexamfetamine (VYVANSE) 20 MG capsule Take 1 capsule (20 mg total) by mouth daily.    [DISCONTINUED] promethazine-dextromethorphan (PROMETHAZINE-DM) 6.25-15 MG/5ML syrup Take 2.5 mLs by mouth 4 (four) times daily as needed for cough.   [DISCONTINUED] Vitamin D, Ergocalciferol, (DRISDOL) 1.25 MG (50000 UNIT) CAPS capsule Take 1 capsule (50,000 Units total) by mouth every 7 (seven) days.   No facility-administered medications prior to visit.    Review of Systems  Constitutional:  Negative for activity change, appetite change, chills, fatigue and fever.  HENT:  Negative for congestion, postnasal drip, rhinorrhea, sinus pressure, sinus pain, sneezing and sore throat.   Eyes: Negative.   Respiratory:  Negative for cough, chest tightness, shortness of breath and wheezing.   Cardiovascular:  Negative for chest pain and palpitations.  Gastrointestinal:  Negative for abdominal pain, constipation, diarrhea, nausea and vomiting.  Endocrine: Negative for cold intolerance, heat intolerance, polydipsia and polyuria.  Genitourinary:  Negative for dyspareunia, dysuria, flank pain, frequency and urgency.  Musculoskeletal:  Positive for arthralgias and joint swelling. Negative for back pain and myalgias.       Bilateral knee pain   Skin:  Negative for rash.  Allergic/Immunologic: Negative for environmental allergies.  Neurological:  Negative for dizziness, weakness and headaches.  Hematological:  Negative for adenopathy.  Psychiatric/Behavioral:  Positive for decreased concentration and dysphoric mood. The patient is nervous/anxious.     Last CBC Lab Results  Component Value Date   WBC 7.6 02/28/2022   HGB 12.5 02/28/2022   HCT 39.9 02/28/2022   MCV 89.3 02/28/2022  MCH 28.0 02/28/2022   RDW 15.8 (H) 02/28/2022   PLT 269 0000000   Last metabolic panel Lab Results  Component Value Date   GLUCOSE 89 02/28/2022   NA 138 02/28/2022   K 3.9 02/28/2022   CL 106 02/28/2022   CO2 23 02/28/2022   BUN 11 02/28/2022   CREATININE 0.67 02/28/2022   GFRNONAA >60 02/28/2022   CALCIUM  8.9 02/28/2022   PROT 7.0 02/28/2022   ALBUMIN 3.8 02/28/2022   LABGLOB 2.2 01/30/2022   AGRATIO 1.9 01/30/2022   BILITOT 0.6 02/28/2022   ALKPHOS 79 02/28/2022   AST 19 02/28/2022   ALT 13 02/28/2022   ANIONGAP 9 02/28/2022   Last lipids Lab Results  Component Value Date   CHOL 251 (H) 01/30/2022   HDL 57 01/30/2022   LDLCALC 164 (H) 01/30/2022   TRIG 164 (H) 01/30/2022   CHOLHDL 4.4 01/30/2022   Last hemoglobin A1c Lab Results  Component Value Date   HGBA1C 5.4 01/30/2022   Last thyroid functions Lab Results  Component Value Date   TSH 2.050 01/30/2022   Last vitamin D Lab Results  Component Value Date   VD25OH 8.8 (L) 01/30/2022       Objective     Today's Vitals   09/16/22 1539 09/16/22 1556  BP: (Abnormal) 156/90 (Abnormal) 140/90  Pulse: 95   SpO2: 96%   Weight: 293 lb 12.8 oz (133.3 kg)   Height: 5' 5"$  (1.651 m)    Body mass index is 48.89 kg/m.  BP Readings from Last 3 Encounters:  09/16/22 (Abnormal) 140/90  02/28/22 136/87  01/15/22 132/83    Wt Readings from Last 3 Encounters:  09/23/22 291 lb (132 kg)  09/16/22 293 lb 12.8 oz (133.3 kg)  02/28/22 280 lb (127 kg)    Physical Exam Vitals and nursing note reviewed.  Constitutional:      Appearance: Normal appearance. She is well-developed.  HENT:     Head: Normocephalic and atraumatic.     Nose: Nose normal.     Mouth/Throat:     Mouth: Mucous membranes are moist.     Pharynx: Oropharynx is clear.  Eyes:     Extraocular Movements: Extraocular movements intact.     Conjunctiva/sclera: Conjunctivae normal.     Pupils: Pupils are equal, round, and reactive to light.  Cardiovascular:     Rate and Rhythm: Normal rate and regular rhythm.     Pulses: Normal pulses.     Heart sounds: Normal heart sounds.  Pulmonary:     Effort: Pulmonary effort is normal.     Breath sounds: Normal breath sounds.  Abdominal:     Palpations: Abdomen is soft.  Musculoskeletal:        General: Normal  range of motion.     Cervical back: Normal range of motion and neck supple.  Lymphadenopathy:     Cervical: No cervical adenopathy.  Skin:    General: Skin is warm and dry.     Capillary Refill: Capillary refill takes less than 2 seconds.  Neurological:     General: No focal deficit present.     Mental Status: She is alert and oriented to person, place, and time.  Psychiatric:        Mood and Affect: Mood normal.        Behavior: Behavior normal.        Thought Content: Thought content normal.        Judgment: Judgment normal.      Assessment &  Plan    1. Gastroesophageal reflux disease, unspecified whether esophagitis present May take omeprazole 20 mg daily. Avoid trigger foods. Try  sleeping with HOB elevated at 30 degrees. Reassess at next visit.  - omeprazole (PRILOSEC) 20 MG capsule; Take 1 capsule (20 mg total) by mouth daily.  Dispense: 30 capsule; Refill: 3  2. Chronic pain of left knee Trial voltaren 75 mg up to  twice daily to take as needed for pain and inflammation. Refer to orthopedics for further  evaluation and treatment.  - Ambulatory referral to Orthopedic Surgery - diclofenac (VOLTAREN) 75 MG EC tablet; Take 1 tablet (75 mg total) by mouth 2 (two) times daily.  Dispense: 60 tablet; Refill: 1  3. 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 Continue sertraline 100 mg daily.  - sertraline (ZOLOFT) 100 MG tablet; Take 1 tablet (100 mg total) by mouth daily. **NEEDS APT FOR REFILLS**  Dispense: 30 tablet; Refill: 3  4. Anxiety May take alprazolam 0.5 mg up to twice daily as needed for acute anxiety.  New prescription sent to  her pharmacy.  - ALPRAZolam (XANAX) 0.5 MG tablet; Take 1 tablet (0.5 mg total) by mouth 2 (two) times daily as needed for anxiety.  Dispense: 30 tablet; Refill: 0  5. Insomnia, unspecified type May take trazodone 100 mg at bedtime as needed.  - traZODone (DESYREL) 150 MG tablet; Take 1 tablet po QHS prn  Dispense: 30  tablet; Refill: 3  6. BMI 45.0-49.9, adult (HCC) Discussed lowering calorie intake to 1500 calories per day and incorporating exercise into daily routine to help lose weight.    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)   Relevant Medications   ALPRAZolam (XANAX) 0.5 MG tablet   sertraline (ZOLOFT) 100 MG tablet   traZODone (DESYREL) 150 MG tablet   Chronic pain of left knee   Relevant Medications   sertraline (ZOLOFT) 100 MG tablet   traZODone (DESYREL) 150 MG tablet   diclofenac (VOLTAREN) 75 MG EC tablet   Other Relevant Orders   Ambulatory referral to Orthopedic Surgery   Anxiety   Relevant Medications   ALPRAZolam (XANAX) 0.5 MG tablet   sertraline (ZOLOFT) 100 MG tablet   traZODone (DESYREL) 150 MG tablet   Other Visit Diagnoses     BMI 45.0-49.9, adult ([place])            Return in about 4 months (around 01/15/2023) for health maintenance exam, FBW a week prior to visit.         Ronnell Freshwater, NP  [facility] at [facility] [phone] (phone) [phone] (fax)  [place]

## 2022-09-23 ENCOUNTER — Ambulatory Visit (INDEPENDENT_AMBULATORY_CARE_PROVIDER_SITE_OTHER): Payer: Medicaid Other

## 2022-09-23 ENCOUNTER — Ambulatory Visit (INDEPENDENT_AMBULATORY_CARE_PROVIDER_SITE_OTHER): Payer: Medicaid Other | Admitting: Orthopaedic Surgery

## 2022-09-23 VITALS — Ht 65.0 in | Wt 291.0 lb

## 2022-09-23 DIAGNOSIS — G8929 Other chronic pain: Secondary | ICD-10-CM | POA: Diagnosis not present

## 2022-09-23 DIAGNOSIS — M25562 Pain in left knee: Secondary | ICD-10-CM

## 2022-09-23 MED ORDER — LIDOCAINE HCL 1 % IJ SOLN
3.0000 mL | INTRAMUSCULAR | Status: AC | PRN
  Administered 2022-09-23: 3 mL

## 2022-09-23 MED ORDER — METHYLPREDNISOLONE ACETATE 40 MG/ML IJ SUSP
40.0000 mg | INTRAMUSCULAR | Status: AC | PRN
  Administered 2022-09-23: 40 mg via INTRA_ARTICULAR

## 2022-09-23 NOTE — Progress Notes (Signed)
Office Visit Note   Patient: Marie Ingram           Date of Birth: 05-29-1971           MRN: [id number] Visit Date: 09/23/2022              Requested by: Lorrene Reid, PA-C No address on file PCP: Lorrene Reid, PA-C   Assessment & Plan: Visit Diagnoses:  1. Chronic pain of left knee     Plan: I do feel that she is developing significant arthritic changes with her left knee.  I have recommended certainly continuing her weight loss but also working on quad strengthening exercises and I showed her what I would like her to try.  I do recommend her getting diclofenac filled and I offered a steroid injection in her left knee today and she agreed to this and tolerated it well.  I would like to see her back in 4 weeks to see how she is doing overall.  All questions and concerns were answered and addressed.  Follow-Up Instructions: Return in about 4 weeks (around 10/21/2022).   Orders:  Orders Placed This Encounter  Procedures   Large Joint Inj   XR Knee 1-2 Views Left   No orders of the defined types were placed in this encounter.     Procedures: Large Joint Inj: L knee on 09/23/2022 10:29 AM Indications: diagnostic evaluation and pain Details: 22 G 1.5 in needle, superolateral approach  Arthrogram: No  Medications: 3 mL lidocaine 1 %; 40 mg methylPREDNISolone acetate 40 MG/ML Outcome: tolerated well, no immediate complications Procedure, treatment alternatives, risks and benefits explained, specific risks discussed. Consent was given by the patient. Immediately prior to procedure a time out was called to verify the correct patient, procedure, equipment, support staff and site/side marked as required. Patient was prepped and draped in the usual sterile fashion.       Clinical Data: No additional findings.   Subjective: Chief Complaint  Patient presents with   Left Knee - Pain  The patient comes in with chief complaint of left knee pain for about 5 months now  but has been getting worse with no known injury.  She did let me know that her primary care physician recently ordered diclofenac for her but she has not been able to get that as of yet.  Sometimes she has to sleep on her couch because of her back and her knee still hurts.  She has been using lidocaine patch and Biofreeze as well.  She has never had surgery on that knee and she denies any locking catching.  Most of pain is lateral and posterior lateral.  She is on a weight loss journey.  Her BMI is 48.42 and she is working to try to lose weight.  HPI  Review of Systems There is currently listed no fever, chills, nausea, vomiting  Objective: Vital Signs: Ht 5\' 5"  (1.651 m)   Wt 291 lb (132 kg)   BMI 48.42 kg/m   Physical Exam She is alert and orient x 3 and in no acute distress Ortho Exam Examination of both knees show slight valgus malalignment more so on the left than the right knee.  There is no effusion that is significant of either knee but the left knee is slightly more warm.  She does have aggravation on the lateral compartment the knee with McMurray's exam. Specialty Comments:  No specialty comments available.  Imaging: XR Knee 1-2 Views Left  Result Date:  09/23/2022 2 views of the left knee show no acute findings.  There is patellofemoral arthritic changes noted and small osteophytes posteriorly and at the trochlear groove.    PMFS History: Patient Active Problem List   Diagnosis Date Noted   Upper respiratory tract infection 05/10/2021   Cough 05/10/2021   Abdominal hernia without obstruction and without gangrene 02/10/2020   Former smoker (quit 2004, 9 pack/year hx) 11/29/2019   Hyperlipidemia 09/09/2018   ADD (attention deficit disorder) 09/24/2017   Other abnormal glucose 10/04/2015   Vitamin D deficiency 10/04/2015   Morbid obesity with BMI of 45.0-49.9, adult ([place]) 05/27/2014   Migraines    Kidney stones    Insomnia    Recurrent mild major depressive disorder  with anxiety ([place])    Past Medical History:  Diagnosis Date   Anxiety    Depression    Endometriosis    Insomnia    Kidney stones    Migraines     Family History  Problem Relation Age of Onset   Migraines Mother    Hypertension Mother    Anxiety disorder Mother    Depression Mother    Depression Father    Alcohol abuse Father    Heart disease Father    Celiac disease Sister    Depression Sister    ADD / ADHD Daughter    Autism Son    ADD / ADHD Son    Alzheimer's disease Maternal Grandmother    Alzheimer's disease Maternal Grandfather    Heart attack Paternal Grandmother    Stroke Paternal Grandfather    Alzheimer's disease Maternal Aunt        x3    Past Surgical History:  Procedure Laterality Date   CESAREAN SECTION     X 2   ENDOMETRIAL ABLATION     PELVIC LAPAROSCOPY  2004   Laser Endometriosis   TONSILLECTOMY  2011   WISDOM TOOTH EXTRACTION  1990   Social History   Occupational History   Not on file  Tobacco Use   Smoking status: Former    Packs/day: 0.50    Years: 8.00    Total pack years: 4.00    Types: Cigarettes    Quit date: 05/19/2003    Years since quitting: 19.3   Smokeless tobacco: Never  Vaping Use   Vaping Use: Never used  Substance and Sexual Activity   Alcohol use: Yes    Alcohol/week: 5.0 standard drinks of alcohol    Types: 5 Shots of liquor per week    Comment: daily   Drug use: No   Sexual activity: Yes    Partners: Male    Birth control/protection: None, Surgical    Comment: 1st intercourse 52 yo-More than 5 partners

## 2022-10-03 DIAGNOSIS — Z419 Encounter for procedure for purposes other than remedying health state, unspecified: Secondary | ICD-10-CM | POA: Diagnosis not present

## 2022-10-07 ENCOUNTER — Other Ambulatory Visit: Payer: Self-pay

## 2022-10-07 ENCOUNTER — Encounter: Payer: Self-pay | Admitting: Orthopaedic Surgery

## 2022-10-07 DIAGNOSIS — G8929 Other chronic pain: Secondary | ICD-10-CM

## 2022-10-07 NOTE — Telephone Encounter (Signed)
MRI ordered

## 2022-10-08 ENCOUNTER — Other Ambulatory Visit: Payer: Self-pay | Admitting: Orthopaedic Surgery

## 2022-10-08 MED ORDER — TRAMADOL HCL 50 MG PO TABS: 100.0000 mg | ORAL_TABLET | Freq: Three times a day (TID) | ORAL | 0 refills | Status: DC | PRN

## 2022-10-20 DIAGNOSIS — F419 Anxiety disorder, unspecified: Secondary | ICD-10-CM | POA: Insufficient documentation

## 2022-10-20 DIAGNOSIS — K219 Gastro-esophageal reflux disease without esophagitis: Secondary | ICD-10-CM | POA: Insufficient documentation

## 2022-10-21 ENCOUNTER — Ambulatory Visit
Admission: RE | Admit: 2022-10-21 | Discharge: 2022-10-21 | Disposition: A | Discharge status: Home / Self Care | Payer: Medicaid Other | Source: Ambulatory Visit | Attending: Orthopaedic Surgery | Admitting: Orthopaedic Surgery

## 2022-10-21 DIAGNOSIS — G8929 Other chronic pain: Secondary | ICD-10-CM

## 2022-10-23 ENCOUNTER — Encounter: Payer: Self-pay | Admitting: Orthopaedic Surgery

## 2022-10-23 ENCOUNTER — Ambulatory Visit (INDEPENDENT_AMBULATORY_CARE_PROVIDER_SITE_OTHER): Payer: Medicaid Other | Admitting: Orthopaedic Surgery

## 2022-10-23 VITALS — Wt 285.0 lb

## 2022-10-23 DIAGNOSIS — M25562 Pain in left knee: Secondary | ICD-10-CM | POA: Diagnosis not present

## 2022-10-23 DIAGNOSIS — G8929 Other chronic pain: Secondary | ICD-10-CM

## 2022-10-23 DIAGNOSIS — S83232D Complex tear of medial meniscus, current injury, left knee, subsequent encounter: Secondary | ICD-10-CM | POA: Diagnosis not present

## 2022-10-23 NOTE — Progress Notes (Signed)
The patient comes in today to go over an MRI of her left knee.  She is 52 years old.  We did place a steroid injection in her left knee that did help for about 48 hours.  She has a lot of pain with pivoting activities with that left knee as well as locking catching.  Its on the medial aspect of the knee.  On exam she is does have a positive McMurray's exam to the medial compartment the knee.  There is good range of motion of the knee and it feels ligamentously stable but is very painful.  The MRI of the left knee does show a complex medial meniscal tear.  There is thinning of the cartilage in her knee but no full-thickness cartilage loss.  The rest of the ligaments are intact and the lateral meniscus is intact.  I went over knee model with her and we have recommended arthroscopic intervention for her left knee with a partial medial meniscectomy.  I described in detail why we are recommending surgery and the risk and benefits of surgery as well.  She does wish to proceed given the failure conservative treatment combined with her daily pain with that knee.  Also she is having significant mechanical symptoms.  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  I would let her get back to that probably by next week if she is comfortable.  We talked about what to expect from an intraoperative and postoperative standpoint.  We will work on getting this surgery scheduled.  All question concerns were answered and addressed.

## 2022-11-01 DIAGNOSIS — Z419 Encounter for procedure for purposes other than remedying health state, unspecified: Secondary | ICD-10-CM | POA: Diagnosis not present

## 2022-11-07 ENCOUNTER — Other Ambulatory Visit: Payer: Self-pay | Admitting: Orthopaedic Surgery

## 2022-11-08 MED ORDER — TRAMADOL HCL 50 MG PO TABS: 100.0000 mg | ORAL_TABLET | Freq: Three times a day (TID) | ORAL | 0 refills | Status: DC | PRN

## 2022-11-21 ENCOUNTER — Other Ambulatory Visit: Payer: Self-pay | Admitting: Orthopaedic Surgery

## 2022-11-21 DIAGNOSIS — G8918 Other acute postprocedural pain: Secondary | ICD-10-CM | POA: Diagnosis not present

## 2022-11-21 DIAGNOSIS — X58XXXA Exposure to other specified factors, initial encounter: Secondary | ICD-10-CM | POA: Diagnosis not present

## 2022-11-21 DIAGNOSIS — S83232A Complex tear of medial meniscus, current injury, left knee, initial encounter: Secondary | ICD-10-CM | POA: Diagnosis not present

## 2022-11-21 DIAGNOSIS — Y999 Unspecified external cause status: Secondary | ICD-10-CM | POA: Diagnosis not present

## 2022-11-21 MED ORDER — METHOCARBAMOL 500 MG PO TABS: 500.0000 mg | ORAL_TABLET | Freq: Four times a day (QID) | ORAL | 1 refills | Status: DC | PRN

## 2022-11-21 MED ORDER — HYDROCODONE-ACETAMINOPHEN 5-325 MG PO TABS: 1.0000 | ORAL_TABLET | Freq: Four times a day (QID) | ORAL | 0 refills | Status: DC | PRN

## 2022-11-21 MED ORDER — ONDANSETRON 4 MG PO TBDP: 4.0000 mg | ORAL_TABLET | Freq: Three times a day (TID) | ORAL | 0 refills | Status: DC | PRN

## 2022-11-27 ENCOUNTER — Encounter: Payer: Self-pay | Admitting: Orthopaedic Surgery

## 2022-11-27 ENCOUNTER — Ambulatory Visit (INDEPENDENT_AMBULATORY_CARE_PROVIDER_SITE_OTHER): Payer: Medicaid Other | Admitting: Orthopaedic Surgery

## 2022-11-27 DIAGNOSIS — Z9889 Other specified postprocedural states: Secondary | ICD-10-CM

## 2022-11-27 DIAGNOSIS — S83232D Complex tear of medial meniscus, current injury, left knee, subsequent encounter: Secondary | ICD-10-CM

## 2022-11-27 MED ORDER — HYDROCODONE-ACETAMINOPHEN 5-325 MG PO TABS: 1.0000 | ORAL_TABLET | Freq: Four times a day (QID) | ORAL | 0 refills | Status: DC | PRN

## 2022-11-27 NOTE — Progress Notes (Signed)
The patient is here today for for postoperative visit status post a left knee arthroscopy and partial medial meniscectomy.  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  She is ambulating off and on with a walker.  She has had some good days and bad days but overall is making progress.  I did go over the arthroscopy pictures with her to show her the extent of the cartilage wear on her right knee medial femoral condyle.  The rest of the knee looked pretty good and the meniscal tear was small fortunately.  I did remove the sutures from incisions.  Will keep her out of work until Monday.  This will allow her to continue with anti-inflammatories and occasional pain medicine which I will send in some more hydrocodone.  She would certainly benefit from quad strengthening exercises and certainly weight loss in the long run and I explained this to her in terms of the arthritis aspects of her knee medial compartment.  She may be a candidate for hyaluronic acid down the road.  We would consider formal physical therapy but I would like to see her back first in 4 weeks to see what progress she is making.  She agrees with this treatment plan.

## 2022-12-02 ENCOUNTER — Other Ambulatory Visit: Payer: Self-pay | Admitting: Nurse Practitioner

## 2022-12-02 DIAGNOSIS — F419 Anxiety disorder, unspecified: Secondary | ICD-10-CM

## 2022-12-02 DIAGNOSIS — Z419 Encounter for procedure for purposes other than remedying health state, unspecified: Secondary | ICD-10-CM | POA: Diagnosis not present

## 2022-12-03 MED ORDER — ALPRAZOLAM 0.5 MG PO TABS: 0.5000 mg | ORAL_TABLET | Freq: Two times a day (BID) | ORAL | 1 refills | Status: DC | PRN

## 2022-12-09 ENCOUNTER — Other Ambulatory Visit: Payer: Self-pay | Admitting: Nurse Practitioner

## 2022-12-09 DIAGNOSIS — F330 Major depressive disorder, recurrent, mild: Secondary | ICD-10-CM

## 2022-12-20 ENCOUNTER — Other Ambulatory Visit: Payer: Self-pay | Admitting: Orthopaedic Surgery

## 2022-12-20 MED ORDER — HYDROCODONE-ACETAMINOPHEN 5-325 MG PO TABS: 1.0000 | ORAL_TABLET | Freq: Four times a day (QID) | ORAL | 0 refills | Status: DC | PRN

## 2023-01-01 ENCOUNTER — Ambulatory Visit (INDEPENDENT_AMBULATORY_CARE_PROVIDER_SITE_OTHER): Payer: Medicaid Other | Admitting: Orthopaedic Surgery

## 2023-01-01 ENCOUNTER — Encounter: Payer: Self-pay | Admitting: Orthopaedic Surgery

## 2023-01-01 ENCOUNTER — Telehealth: Payer: Self-pay

## 2023-01-01 DIAGNOSIS — Z419 Encounter for procedure for purposes other than remedying health state, unspecified: Secondary | ICD-10-CM | POA: Diagnosis not present

## 2023-01-01 DIAGNOSIS — Z9889 Other specified postprocedural states: Secondary | ICD-10-CM

## 2023-01-01 NOTE — Telephone Encounter (Signed)
Left knee gel injection ?

## 2023-01-01 NOTE — Progress Notes (Signed)
The patient is here in follow-up about 6 weeks after a left knee arthroscopy.  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  She is only 52 years old.  She is having burning underneath her patella and pain in general with that knee since surgery.  A lot of this pain is to be expected given recovery from surgery but certainly some of this is related to the arthritic findings we have seen as well.  She is also likely dealing with synovitis that is postoperative.  Examination of her left knee does show some pain but no significant swelling fortunately.  There is pain throughout the arc of motion of the knee.  Given the chondromalacia and her knee and the grade 3 findings, she is a perfect candidate for viscosupplementation for her knee.  In order to temporize the synovitis I did place a steroid injection in her left knee today which she tolerated well.  We will now order viscosupplementation for her left knee to treat the pain from osteoarthritis of that knee.  She agrees with this plan as well.  All question concerns were answered and addressed.  This patient is diagnosed with osteoarthritis of the knee(s).    Radiographs show evidence of joint space narrowing, osteophytes, subchondral sclerosis and/or subchondral cysts.  This patient has knee pain which interferes with functional and activities of daily living.    This patient has experienced inadequate response, adverse effects and/or intolerance with conservative treatments such as acetaminophen, NSAIDS, topical creams, physical therapy or regular exercise, knee bracing and/or weight loss.   This patient has experienced inadequate response or has a contraindication to intra articular steroid injections for at least 3 months.   This patient is not scheduled to have a total knee replacement within 6 months of starting treatment with viscosupplementation.

## 2023-01-02 NOTE — Telephone Encounter (Signed)
Talked with patient and advised her that Medicaid does not cover gel injections. Advised her about TriVisc and she would like to try this.  Submitted for TriVisc, left knee

## 2023-01-20 ENCOUNTER — Other Ambulatory Visit: Payer: Self-pay | Admitting: Nurse Practitioner

## 2023-01-20 DIAGNOSIS — K219 Gastro-esophageal reflux disease without esophagitis: Secondary | ICD-10-CM

## 2023-01-24 ENCOUNTER — Ambulatory Visit: Payer: Medicaid Other | Admitting: Nurse Practitioner

## 2023-02-01 DIAGNOSIS — Z419 Encounter for procedure for purposes other than remedying health state, unspecified: Secondary | ICD-10-CM | POA: Diagnosis not present

## 2023-02-06 ENCOUNTER — Encounter: Payer: Self-pay | Admitting: Family Medicine

## 2023-02-06 ENCOUNTER — Ambulatory Visit (INDEPENDENT_AMBULATORY_CARE_PROVIDER_SITE_OTHER): Payer: Medicaid Other | Admitting: Family Medicine

## 2023-02-06 VITALS — BP 133/93 | HR 92 | Resp 18 | Ht 65.0 in | Wt 263.0 lb

## 2023-02-06 DIAGNOSIS — Z6841 Body Mass Index (BMI) 40.0 and over, adult: Secondary | ICD-10-CM | POA: Diagnosis not present

## 2023-02-06 DIAGNOSIS — E782 Mixed hyperlipidemia: Secondary | ICD-10-CM | POA: Diagnosis not present

## 2023-02-06 DIAGNOSIS — F419 Anxiety disorder, unspecified: Secondary | ICD-10-CM | POA: Diagnosis not present

## 2023-02-06 DIAGNOSIS — Z1231 Encounter for screening mammogram for malignant neoplasm of breast: Secondary | ICD-10-CM | POA: Diagnosis not present

## 2023-02-06 DIAGNOSIS — E559 Vitamin D deficiency, unspecified: Secondary | ICD-10-CM | POA: Diagnosis not present

## 2023-02-06 MED ORDER — SERTRALINE HCL 100 MG PO TABS: 150.0000 mg | ORAL_TABLET | Freq: Every day | ORAL | 0 refills | Status: DC

## 2023-02-06 NOTE — Progress Notes (Signed)
   Acute Office Visit  Subjective:     Patient ID: Marie Ingram, female    DOB: 1971-07-08, 52 y.o.   MRN: [id number]  Chief Complaint  Patient presents with   Anxiety    HPI Patient is in today for "anxiety issue". She has a history of anxiety and depression previously well controlled with Zoloft 100 mg daily. She also takes trazodone for sleep and has Xanax for breakthrough anxiety. Ever since her knee surgery in March, she has been experiencing increased anxiety. It has manifested with more physical symptoms, like racing heart when she wakes up in the morning.   ROS Negative unless otherwise noted in HPI    Objective:    BP (!) 133/93 (BP Location: Left Arm, Patient Position: Sitting, Cuff Size: Large)   Pulse 92   Resp 18   Ht 5\' 5"  (1.651 m)   Wt 263 lb (119.3 kg)   SpO2 95%   BMI 43.77 kg/m   Physical Exam Constitutional:      General: She is not in acute distress.    Appearance: Normal appearance.  HENT:     Head: Normocephalic and atraumatic.  Cardiovascular:     Rate and Rhythm: Normal rate and regular rhythm.     Heart sounds: No murmur heard.    No friction rub. No gallop.  Pulmonary:     Effort: Pulmonary effort is normal. No respiratory distress.     Breath sounds: No wheezing, rhonchi or rales.  Skin:    General: Skin is warm and dry.  Neurological:     Mental Status: She is alert and oriented to person, place, and time.       Assessment & Plan:  Anxiety Assessment & Plan: Discussed options including increasing Zoloft, adding buspirone, and therapy. Patient would like to start by trying an increase in Zoloft. Start Zoloft 150 mg daily. At her CPE in two weeks, will assess efficacy.  Orders: -     CBC with Differential/Platelet; Future -     Comprehensive metabolic panel; Future -     Sertraline HCl; Take 1.5 tablets (150 mg total) by mouth daily.  Dispense: 45 tablet; Refill: 0  Morbid obesity with BMI of 45.0-49.9, adult (HCC) -     CBC  with Differential/Platelet; Future -     Comprehensive metabolic panel; Future -     Hemoglobin A1c; Future -     Lipid panel; Future -     VITAMIN D 25 Hydroxy (Vit-D Deficiency, Fractures); Future -     TSH; Future -     T4, free; Future  Vitamin D deficiency -     VITAMIN D 25 Hydroxy (Vit-D Deficiency, Fractures); Future  Mixed hyperlipidemia -     Lipid panel; Future  Screening mammogram for breast cancer -     Digital Screening Mammogram, Left and Right; Future  Ordering labs for CPE and mammogram.  Return in about 13 days (around 02/19/2023) for annual physical (already scheduled). If no response to Zoloft increase, recommend follow up 2-4 weeks after CPE to assess response.  Melida Quitter, PA

## 2023-02-06 NOTE — Assessment & Plan Note (Signed)
Discussed options including increasing Zoloft, adding buspirone, and therapy. Patient would like to start by trying an increase in Zoloft. Start Zoloft 150 mg daily. At her CPE in two weeks, will assess efficacy.

## 2023-02-07 LAB — COMPREHENSIVE METABOLIC PANEL
ALT: 11 IU/L (ref 0–32)
AST: 13 IU/L (ref 0–40)
Albumin/Globulin Ratio: 2 (ref 1.2–2.2)
Albumin: 4.5 g/dL (ref 3.8–4.9)
Alkaline Phosphatase: 113 IU/L (ref 44–121)
BUN/Creatinine Ratio: 17 (ref 9–23)
BUN: 11 mg/dL (ref 6–24)
Bilirubin Total: 0.2 mg/dL (ref 0.0–1.2)
CO2: 23 mmol/L (ref 20–29)
Calcium: 9.4 mg/dL (ref 8.7–10.2)
Chloride: 104 mmol/L (ref 96–106)
Creatinine, Ser: 0.65 mg/dL (ref 0.57–1.00)
Globulin, Total: 2.2 g/dL (ref 1.5–4.5)
Glucose: 87 mg/dL (ref 70–99)
Potassium: 4.5 mmol/L (ref 3.5–5.2)
Sodium: 140 mmol/L (ref 134–144)
Total Protein: 6.7 g/dL (ref 6.0–8.5)
eGFR: 107 mL/min/{1.73_m2} (ref >59)

## 2023-02-07 LAB — CBC WITH DIFFERENTIAL/PLATELET
Basophils Absolute: 0 10*3/uL (ref 0.0–0.2)
Basos: 1 %
EOS (ABSOLUTE): 0.2 10*3/uL (ref 0.0–0.4)
Eos: 3 %
Hematocrit: 39.5 % (ref 34.0–46.6)
Hemoglobin: 12.2 g/dL (ref 11.1–15.9)
Immature Grans (Abs): 0 10*3/uL (ref 0.0–0.1)
Immature Granulocytes: 1 %
Lymphocytes Absolute: 1.4 10*3/uL (ref 0.7–3.1)
Lymphs: 23 %
MCH: 26.7 pg (ref 26.6–33.0)
MCHC: 30.9 g/dL — ABNORMAL LOW (ref 31.5–35.7)
MCV: 86 fL (ref 79–97)
Monocytes Absolute: 0.4 10*3/uL (ref 0.1–0.9)
Monocytes: 6 %
Neutrophils Absolute: 4.2 10*3/uL (ref 1.4–7.0)
Neutrophils: 66 %
Platelets: 278 10*3/uL (ref 150–450)
RBC: 4.57 x10E6/uL (ref 3.77–5.28)
RDW: 13.6 % (ref 11.7–15.4)
WBC: 6.2 10*3/uL (ref 3.4–10.8)

## 2023-02-07 LAB — LIPID PANEL
Chol/HDL Ratio: 6.3 ratio — ABNORMAL HIGH (ref 0.0–4.4)
Cholesterol, Total: 271 mg/dL — ABNORMAL HIGH (ref 100–199)
HDL: 43 mg/dL (ref >39)
LDL Chol Calc (NIH): 202 mg/dL — ABNORMAL HIGH (ref 0–99)
Triglycerides: 139 mg/dL (ref 0–149)
VLDL Cholesterol Cal: 26 mg/dL (ref 5–40)

## 2023-02-07 LAB — HEMOGLOBIN A1C
Est. average glucose Bld gHb Est-mCnc: 114 mg/dL
Hgb A1c MFr Bld: 5.6 % (ref 4.8–5.6)

## 2023-02-07 LAB — VITAMIN D 25 HYDROXY (VIT D DEFICIENCY, FRACTURES): Vit D, 25-Hydroxy: 18.7 ng/mL — ABNORMAL LOW (ref 30.0–100.0)

## 2023-02-07 LAB — T4, FREE: Free T4: 0.98 ng/dL (ref 0.82–1.77)

## 2023-02-07 LAB — TSH: TSH: 2.16 u[IU]/mL (ref 0.450–4.500)

## 2023-02-14 ENCOUNTER — Other Ambulatory Visit: Payer: Self-pay | Admitting: Nurse Practitioner

## 2023-02-14 DIAGNOSIS — F419 Anxiety disorder, unspecified: Secondary | ICD-10-CM

## 2023-02-19 ENCOUNTER — Encounter: Payer: Medicaid Other | Admitting: Family Medicine

## 2023-02-25 ENCOUNTER — Encounter: Payer: Self-pay | Admitting: Family Medicine

## 2023-02-25 DIAGNOSIS — F419 Anxiety disorder, unspecified: Secondary | ICD-10-CM

## 2023-02-25 MED ORDER — ALPRAZOLAM 0.5 MG PO TABS
0.5000 mg | ORAL_TABLET | Freq: Two times a day (BID) | ORAL | 0 refills | Status: DC | PRN
Start: 2023-02-25 — End: 2023-03-19

## 2023-03-03 DIAGNOSIS — Z419 Encounter for procedure for purposes other than remedying health state, unspecified: Secondary | ICD-10-CM | POA: Diagnosis not present

## 2023-03-19 ENCOUNTER — Encounter: Payer: Self-pay | Admitting: Family Medicine

## 2023-03-19 ENCOUNTER — Ambulatory Visit (INDEPENDENT_AMBULATORY_CARE_PROVIDER_SITE_OTHER): Payer: Medicaid Other | Admitting: Family Medicine

## 2023-03-19 VITALS — BP 130/83 | HR 78 | Resp 18 | Ht 65.0 in | Wt 247.0 lb

## 2023-03-19 DIAGNOSIS — E782 Mixed hyperlipidemia: Secondary | ICD-10-CM | POA: Diagnosis not present

## 2023-03-19 DIAGNOSIS — E559 Vitamin D deficiency, unspecified: Secondary | ICD-10-CM | POA: Diagnosis not present

## 2023-03-19 DIAGNOSIS — F419 Anxiety disorder, unspecified: Secondary | ICD-10-CM | POA: Diagnosis not present

## 2023-03-19 DIAGNOSIS — Z1211 Encounter for screening for malignant neoplasm of colon: Secondary | ICD-10-CM

## 2023-03-19 DIAGNOSIS — Z0000 Encounter for general adult medical examination without abnormal findings: Secondary | ICD-10-CM | POA: Diagnosis not present

## 2023-03-19 DIAGNOSIS — G4700 Insomnia, unspecified: Secondary | ICD-10-CM

## 2023-03-19 DIAGNOSIS — Z6841 Body Mass Index (BMI) 40.0 and over, adult: Secondary | ICD-10-CM

## 2023-03-19 DIAGNOSIS — Z1212 Encounter for screening for malignant neoplasm of rectum: Secondary | ICD-10-CM | POA: Diagnosis not present

## 2023-03-19 MED ORDER — ALPRAZOLAM 0.5 MG PO TABS
0.5000 mg | ORAL_TABLET | Freq: Two times a day (BID) | ORAL | 0 refills | Status: DC | PRN
Start: 2023-03-19 — End: 2023-04-14

## 2023-03-19 MED ORDER — VITAMIN D (ERGOCALCIFEROL) 1.25 MG (50000 UNIT) PO CAPS
50000.0000 [IU] | ORAL_CAPSULE | ORAL | 0 refills | Status: DC
Start: 2023-03-19 — End: 2023-05-28

## 2023-03-19 MED ORDER — TRAZODONE HCL 150 MG PO TABS
ORAL_TABLET | ORAL | 3 refills | Status: DC
Start: 2023-03-19 — End: 2023-05-28

## 2023-03-19 NOTE — Progress Notes (Signed)
Complete physical exam  Patient: Marie Ingram   DOB: [birth year]   52 y.o. Female  MRN: [id number]  Subjective:    Chief Complaint  Patient presents with   Annual Exam    Marie Ingram is a 52 y.o. female who presents today for a complete physical exam. She reports consuming a  weight watchers  diet.  She is focusing on making nutritional changes first and plans on implementing a physical activity routine after she feels comfortable maintaining nutrition.  She generally feels well. She reports sleeping well, she is taking about a third of a trazodone tablet and 1 Xanax nightly. She does not have additional problems to discuss today.  She found that the increase in Zoloft to 150 mg made her feel "almost manic", so she went back to the 100 mg dose but has been taking it more consistently.  She has found that this has greatly improved her depression, though she still experiences significant anxiety.  She has decreased her use of Xanax from twice a day to once a day at bedtime.   Most recent fall risk assessment:    09/16/2022    3:42 PM  Fall Risk   Falls in the past year? 0  Number falls in past yr: 0  Injury with Fall? 0  Follow up Falls evaluation completed     Most recent depression and anxiety screenings:    03/19/2023    8:31 AM 02/06/2023    8:26 AM  PHQ 2/9 Scores  PHQ - 2 Score 0 2  PHQ- 9 Score 6 13      03/19/2023    8:33 AM 02/06/2023    8:26 AM 09/16/2022    3:41 PM 01/15/2022    3:40 PM  GAD 7 : Generalized Anxiety Score  Nervous, Anxious, on Edge 1 3 1 2   Control/stop worrying 1 0 1 1  Worry too much - different things 1 1 1 1   Trouble relaxing 0 1 1 1   Restless 1 2 0 2  Easily annoyed or irritable 0 1 1 1   Afraid - awful might happen 0 0 0 1  Total GAD 7 Score 4 8 5 9   Anxiety Difficulty Not difficult at all Somewhat difficult  Very difficult    Patient Active Problem List   Diagnosis Date Noted   Gastroesophageal reflux disease 10/20/2022   Anxiety  10/20/2022   Abdominal hernia without obstruction and without gangrene 02/10/2020   Former smoker (quit 2004, 9 pack/year hx) 11/29/2019   Hyperlipidemia 09/09/2018   ADD (attention deficit disorder) 09/24/2017   Other abnormal glucose 10/04/2015   Vitamin D deficiency 10/04/2015   Chronic pain of left knee 07/12/2014   Morbid obesity with BMI of 40.0-44.9, adult (HCC) 05/27/2014   Migraines    Insomnia    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)     Past Surgical History:  Procedure Laterality Date   CESAREAN SECTION     X 2   ENDOMETRIAL ABLATION     PELVIC LAPAROSCOPY  2004   Laser Endometriosis   TONSILLECTOMY  2011   WISDOM TOOTH EXTRACTION  1990   Social History   Tobacco Use   Smoking status: Former    Current packs/day: 0.00    Average packs/day: 0.5 packs/day for 8.0 years (4.0 ttl pk-yrs)    Types: Cigarettes    Start date: 05/19/1995    Quit date: 05/19/2003    Years since quitting: 19.8  Passive exposure: Never   Smokeless tobacco: Never  Vaping Use   Vaping status: Never Used  Substance Use Topics   Alcohol use: Yes    Alcohol/week: 5.0 standard drinks of alcohol    Types: 5 Shots of liquor per week    Comment: daily   Drug use: No   Family History  Problem Relation Age of Onset   Migraines Mother    Hypertension Mother    Anxiety disorder Mother    Depression Mother    Depression Father    Alcohol abuse Father    Heart disease Father    Celiac disease Sister    Depression Sister    ADD / ADHD Daughter    Autism Son    ADD / ADHD Son    Alzheimer's disease Maternal Grandmother    Alzheimer's disease Maternal Grandfather    Heart attack Paternal Grandmother    Stroke Paternal Grandfather    Alzheimer's disease Maternal Aunt        x3   Allergies  Allergen Reactions   Sulfa Antibiotics Itching   Amoxicillin Rash   Penicillins Rash     Patient Care Team: Melida Quitter, PA as PCP - General (Family Medicine)    Outpatient Medications Prior to Visit  Medication Sig   acetaminophen (TYLENOL) 500 MG tablet Take 1,000 mg by mouth every 6 (six) hours as needed for mild pain.   fluticasone (FLONASE) 50 MCG/ACT nasal spray Place 2 sprays into both nostrils daily.   omeprazole (PRILOSEC) 20 MG capsule TAKE 1 CAPSULE BY MOUTH EVERY DAY   [DISCONTINUED] ALPRAZolam (XANAX) 0.5 MG tablet Take 1 tablet (0.5 mg total) by mouth 2 (two) times daily as needed for anxiety.   [DISCONTINUED] traZODone (DESYREL) 150 MG tablet Take 1 tablet po QHS prn   sertraline (ZOLOFT) 100 MG tablet Take 1.5 tablets (150 mg total) by mouth daily.   No facility-administered medications prior to visit.    Review of Systems  Constitutional:  Negative for chills, fever and malaise/fatigue.  HENT:  Negative for congestion and hearing loss.   Eyes:  Negative for blurred vision and double vision.  Respiratory:  Negative for cough and shortness of breath.   Cardiovascular:  Negative for chest pain, palpitations and leg swelling.  Gastrointestinal:  Negative for abdominal pain, constipation, diarrhea and heartburn.  Genitourinary:  Negative for frequency and urgency.  Musculoskeletal:  Negative for myalgias and neck pain.       Tennis elbow flaring  Neurological:  Negative for headaches.  Endo/Heme/Allergies:  Negative for polydipsia.  Psychiatric/Behavioral:  Positive for depression (Improved on consistent use of Zoloft 100 mg). The patient is nervous/anxious and has insomnia (Improved with trazodone and Xanax).       Objective:    BP 130/83 (BP Location: Left Arm, Patient Position: Sitting, Cuff Size: Large)   Pulse 78   Resp 18   Ht 5\' 5"  (1.651 m)   Wt 247 lb (112 kg)   SpO2 96%   BMI 41.10 kg/m    Physical Exam Constitutional:      General: She is not in acute distress.    Appearance: Normal appearance.  HENT:     Head: Normocephalic and atraumatic.     Right Ear: Tympanic membrane, ear canal and external ear  normal.     Left Ear: Tympanic membrane, ear canal and external ear normal.     Nose: Nose normal.     Mouth/Throat:     Mouth: Mucous membranes  are moist.     Pharynx: No oropharyngeal exudate or posterior oropharyngeal erythema.  Eyes:     Extraocular Movements: Extraocular movements intact.     Conjunctiva/sclera: Conjunctivae normal.     Pupils: Pupils are equal, round, and reactive to light.     Comments: Does not wear glasses or contacts  Neck:     Thyroid: No thyroid mass, thyromegaly or thyroid tenderness.  Cardiovascular:     Rate and Rhythm: Normal rate and regular rhythm.     Heart sounds: Normal heart sounds. No murmur heard.    No friction rub. No gallop.  Pulmonary:     Effort: Pulmonary effort is normal. No respiratory distress.     Breath sounds: Normal breath sounds. No wheezing, rhonchi or rales.  Abdominal:     General: Abdomen is flat. Bowel sounds are normal. There is no distension.     Palpations: There is no mass.     Tenderness: There is no abdominal tenderness. There is no guarding.  Musculoskeletal:        General: Normal range of motion.     Cervical back: Normal range of motion and neck supple.  Lymphadenopathy:     Cervical: No cervical adenopathy.  Skin:    General: Skin is warm and dry.  Neurological:     Mental Status: She is alert and oriented to person, place, and time.     Cranial Nerves: No cranial nerve deficit.     Motor: No weakness.     Deep Tendon Reflexes: Reflexes normal.  Psychiatric:        Mood and Affect: Mood normal.        Assessment & Plan:    Routine Health Maintenance and Physical Exam  Immunization History  Administered Date(s) Administered   Influenza,inj,Quad PF,6+ Mos 06/04/2016   Influenza-Unspecified 05/29/2017, 06/09/2018   PFIZER(Purple Top)SARS-COV-2 Vaccination 11/17/2019, 12/15/2019   Pneumococcal-Unspecified 09/02/1996   Td 10/05/2002, 09/13/2014    Health Maintenance  Topic Date Due   MAMMOGRAM   03/24/2021   COVID-19 Vaccine (3 - 2023-24 season) 05/03/2022   Zoster Vaccines- Shingrix (1 of 2) 06/19/2023 (Originally 03/24/2021)   Colonoscopy  09/17/2023 (Originally 03/24/2016)   INFLUENZA VACCINE  04/03/2023   DTaP/Tdap/Td (3 - Tdap) 09/13/2024   PAP SMEAR-Modifier  01/15/2025   Hepatitis C Screening  Completed   HIV Screening  Completed   HPV VACCINES  Aged Out   Reviewed most recent lab work including CBC, CMP, lipid panel, A1c, TSH, vitamin D.  Results were within normal limits with the exception of Vitamin D was still low but has increased from last year, LDL very high at 202 which is up from last year at 164.  She does have a family history of hyperlipidemia and cardiovascular disease.  She would like to recheck cholesterol levels at her next appointment to see what she is able to do by making lifestyle changes alone.  After that point, she may be open to starting medication.  She is agreeable to having Cologuard ordered today for colorectal cancer screening.  She will consider the shingles vaccine.  Discussed health benefits of physical activity, and encouraged her to engage in regular exercise appropriate for her age and condition.  Wellness examination  Anxiety Assessment & Plan: Continue Zoloft 100 mg daily.  Use alprazolam 0.5 mg daily at bedtime for breakthrough anxiety.  We discussed the possibility of adding buspirone in the future for better control of anxiety if taking Zoloft 100 mg daily consistently is  not enough to completely control anxiety symptoms will continue to monitor.  Orders: -     ALPRAZolam; Take 1 tablet (0.5 mg total) by mouth 2 (two) times daily as needed for anxiety.  Dispense: 30 tablet; Refill: 0  Insomnia, unspecified type Assessment & Plan: Continue trazodone 50-150 mg nightly at bedtime.  Will continue to monitor.  Orders: -     traZODone HCl; Take 1 tablet po QHS prn  Dispense: 30 tablet; Refill: 3  Vitamin D deficiency Assessment &  Plan: Use 12 weeks of prescription strength vitamin D, then switch to over-the-counter vitamin D3 2000 unit daily supplement.  Orders: -     Vitamin D (Ergocalciferol); Take 1 capsule (50,000 Units total) by mouth every 7 (seven) days.  Dispense: 12 capsule; Refill: 0  Morbid obesity with BMI of 40.0-44.9, adult [facility]) Assessment & Plan: Continue weight management efforts with weight watchers.   Mixed hyperlipidemia Assessment & Plan: Last lipid panel: LDL 202, HDL 43, triglycerides 139.  We discussed that there is likely a significant genetic component and play.  She would like to recheck her levels in about 4 months to see what her diet and lifestyle changes can do before starting medication.   Screening for colorectal cancer -     Cologuard  Recommended trial of a brace and tennis elbow rehabilitation stretches/exercises initially.  Return in about 2 months (around 05/20/2023) for follow-up for mood and sleep, fasting blood work 1 week before (lipid panel).     Melida Quitter, PA

## 2023-03-19 NOTE — Assessment & Plan Note (Signed)
Last lipid panel: LDL 202, HDL 43, triglycerides 139.  We discussed that there is likely a significant genetic component and play.  She would like to recheck her levels in about 4 months to see what her diet and lifestyle changes can do before starting medication.

## 2023-03-19 NOTE — Patient Instructions (Addendum)
PREVENTATIVE CARE: -DUE for Shingles vaccine (2 doses, then done). -DUE for colon cancer screening.  TENNIS ELBOW: -Try one of the braces that you can find on Amazon or at your local drug store. -I included some helpful stretches and exercises to try as well.  VITAMIN D: -Once you finish the prescription, switch to an over the counter vitamin D3 2000 units daily supplement.

## 2023-03-19 NOTE — Assessment & Plan Note (Signed)
Continue weight management efforts with weight watchers.

## 2023-03-19 NOTE — Assessment & Plan Note (Signed)
Use 12 weeks of prescription strength vitamin D, then switch to over-the-counter vitamin D3 2000 unit daily supplement.

## 2023-03-19 NOTE — Assessment & Plan Note (Signed)
Continue trazodone 50-150 mg nightly at bedtime.  Will continue to monitor.

## 2023-03-19 NOTE — Assessment & Plan Note (Signed)
Continue Zoloft 100 mg daily.  Use alprazolam 0.5 mg daily at bedtime for breakthrough anxiety.  We discussed the possibility of adding buspirone in the future for better control of anxiety if taking Zoloft 100 mg daily consistently is not enough to completely control anxiety symptoms will continue to monitor.

## 2023-04-03 DIAGNOSIS — Z419 Encounter for procedure for purposes other than remedying health state, unspecified: Secondary | ICD-10-CM | POA: Diagnosis not present

## 2023-04-14 ENCOUNTER — Other Ambulatory Visit: Payer: Self-pay | Admitting: Family Medicine

## 2023-04-14 DIAGNOSIS — F419 Anxiety disorder, unspecified: Secondary | ICD-10-CM

## 2023-04-17 ENCOUNTER — Encounter: Payer: Self-pay | Admitting: Family Medicine

## 2023-04-17 ENCOUNTER — Other Ambulatory Visit: Payer: Self-pay | Admitting: Family Medicine

## 2023-04-17 DIAGNOSIS — F419 Anxiety disorder, unspecified: Secondary | ICD-10-CM

## 2023-05-04 DIAGNOSIS — Z419 Encounter for procedure for purposes other than remedying health state, unspecified: Secondary | ICD-10-CM | POA: Diagnosis not present

## 2023-05-14 ENCOUNTER — Other Ambulatory Visit: Payer: Self-pay | Admitting: Family Medicine

## 2023-05-14 DIAGNOSIS — F419 Anxiety disorder, unspecified: Secondary | ICD-10-CM

## 2023-05-19 ENCOUNTER — Other Ambulatory Visit: Payer: Self-pay | Admitting: Family Medicine

## 2023-05-19 DIAGNOSIS — E559 Vitamin D deficiency, unspecified: Secondary | ICD-10-CM

## 2023-05-19 DIAGNOSIS — E782 Mixed hyperlipidemia: Secondary | ICD-10-CM

## 2023-05-19 DIAGNOSIS — R718 Other abnormality of red blood cells: Secondary | ICD-10-CM

## 2023-05-21 ENCOUNTER — Other Ambulatory Visit: Payer: Medicaid Other

## 2023-05-21 DIAGNOSIS — R718 Other abnormality of red blood cells: Secondary | ICD-10-CM

## 2023-05-22 LAB — CBC WITH DIFFERENTIAL/PLATELET
Basophils Absolute: 0 10*3/uL (ref 0.0–0.2)
Basos: 1 %
EOS (ABSOLUTE): 0.2 10*3/uL (ref 0.0–0.4)
Eos: 3 %
Hematocrit: 41.6 % (ref 34.0–46.6)
Hemoglobin: 13.2 g/dL (ref 11.1–15.9)
Immature Grans (Abs): 0 10*3/uL (ref 0.0–0.1)
Immature Granulocytes: 0 %
Lymphocytes Absolute: 1.8 10*3/uL (ref 0.7–3.1)
Lymphs: 26 %
MCH: 27.4 pg (ref 26.6–33.0)
MCHC: 31.7 g/dL (ref 31.5–35.7)
MCV: 87 fL (ref 79–97)
Monocytes Absolute: 0.4 10*3/uL (ref 0.1–0.9)
Monocytes: 6 %
Neutrophils Absolute: 4.5 10*3/uL (ref 1.4–7.0)
Neutrophils: 64 %
Platelets: 271 10*3/uL (ref 150–450)
RBC: 4.81 x10E6/uL (ref 3.77–5.28)
RDW: 14.2 % (ref 11.7–15.4)
WBC: 7 10*3/uL (ref 3.4–10.8)

## 2023-05-22 LAB — LIPID PANEL
Chol/HDL Ratio: 5.8 ratio — ABNORMAL HIGH (ref 0.0–4.4)
Cholesterol, Total: 226 mg/dL — ABNORMAL HIGH (ref 100–199)
HDL: 39 mg/dL — ABNORMAL LOW (ref >39)
LDL Chol Calc (NIH): 160 mg/dL — ABNORMAL HIGH (ref 0–99)
Triglycerides: 147 mg/dL (ref 0–149)
VLDL Cholesterol Cal: 27 mg/dL (ref 5–40)

## 2023-05-22 LAB — VITAMIN D 25 HYDROXY (VIT D DEFICIENCY, FRACTURES): Vit D, 25-Hydroxy: 47.1 ng/mL (ref 30.0–100.0)

## 2023-05-28 ENCOUNTER — Ambulatory Visit (INDEPENDENT_AMBULATORY_CARE_PROVIDER_SITE_OTHER): Payer: Medicaid Other | Admitting: Family Medicine

## 2023-05-28 ENCOUNTER — Encounter: Payer: Self-pay | Admitting: Family Medicine

## 2023-05-28 VITALS — BP 112/83 | HR 84 | Resp 20 | Ht 65.0 in | Wt 228.0 lb

## 2023-05-28 DIAGNOSIS — G4700 Insomnia, unspecified: Secondary | ICD-10-CM

## 2023-05-28 DIAGNOSIS — E782 Mixed hyperlipidemia: Secondary | ICD-10-CM

## 2023-05-28 DIAGNOSIS — F419 Anxiety disorder, unspecified: Secondary | ICD-10-CM

## 2023-05-28 DIAGNOSIS — F330 Major depressive disorder, recurrent, mild: Secondary | ICD-10-CM

## 2023-05-28 DIAGNOSIS — E559 Vitamin D deficiency, unspecified: Secondary | ICD-10-CM

## 2023-05-28 MED ORDER — SERTRALINE HCL 100 MG PO TABS: 150.0000 mg | ORAL_TABLET | Freq: Every day | ORAL | 0 refills | Status: DC

## 2023-05-28 MED ORDER — ALPRAZOLAM 0.5 MG PO TABS: 0.5000 mg | ORAL_TABLET | Freq: Every evening | ORAL | 1 refills | Status: DC | PRN

## 2023-05-28 MED ORDER — TRAZODONE HCL 50 MG PO TABS
25.0000 mg | ORAL_TABLET | Freq: Every day | ORAL | 1 refills | Status: DC
Start: 2023-05-28 — End: 2023-06-23

## 2023-05-28 NOTE — Assessment & Plan Note (Signed)
Vitamin D within normal limits 47.1.  Continue over-the-counter vitamin D3 2000 unit daily supplement.

## 2023-05-28 NOTE — Patient Instructions (Signed)
As time goes on, if you are able to try even cutting back a little bit on your dose of Xanax to see how it goes, I think that would be fantastic!  I also sent in lower dose tablets of trazodone so that you can take a lower dose easier than having to cut the 150 mg tablets into small pieces.

## 2023-05-28 NOTE — Assessment & Plan Note (Addendum)
Last lipid panel: LDL 160, HDL 39, triglycerides 147.  LDL has improved from last check which was at 202. The 10-year ASCVD risk score (Arnett DK, et al., 2019) is: 2%.  Continue with low-fat diet and routine physical activity.

## 2023-05-28 NOTE — Assessment & Plan Note (Signed)
Continue Zoloft 100 mg daily in the morning and an additional 50 mg in the evening every other day.  Will continue to monitor.

## 2023-05-28 NOTE — Assessment & Plan Note (Signed)
Discussed working towards decreasing and eventually stopping daily use of Xanax.  Since she is taking such small amounts of trazodone, sent prescription for smaller tablets.  She may continue trazodone 25-100 mg nightly at bedtime.  Will continue to monitor.

## 2023-05-28 NOTE — Assessment & Plan Note (Signed)
Continue Zoloft 100 mg daily with an additional 50 mg every other day.  We discussed that alprazolam is only indicated for rescue use and is not recommended to be used daily, so our goal will be to get her anxiety and insomnia under better control so that she does not need to take it every night.  Until then, use alprazolam 0.5 mg daily at bedtime, and attempt to decrease the dose occasionally to see how she tolerates it.  May consider adding buspirone in the future for better control of anxiety if Zoloft is not enough to completely control anxiety symptoms.  Will continue to monitor.

## 2023-05-28 NOTE — Progress Notes (Signed)
Established Patient Office Visit  Subjective   Patient ID: Marie Ingram, female    DOB: Jul 02, 1971  Age: 52 y.o. MRN: [id number]  Chief Complaint  Patient presents with   Anxiety   Depression    HPI Marie Ingram is a 52 y.o. female presenting today for follow up of sleep, mood. Insomnia: She is taking Xanax nightly as well as 1/3 to 1/2 tablet of trazodone nightly, denies side effects.  Taking more than 75 mg of trazodone at night makes her very groggy the next day. Mood: Patient is here to follow up for depression and anxiety, currently managing with Zoloft.  She tried increasing the dose to 150 mg once a day and found that it made her "almost manic".  With the recent increase in anxiety with finding out that her daughter is pregnant, she has increased to 100 mg Zoloft daily in the morning and an additional 50 mg Zoloft in the evening every other day.  Taking medication without side effects, reports excellent compliance with treatment. Denies mood changes or SI/HI. She has only been taking the increased dose for a little while now and wants to give herself some time to adjust to it.     05/28/2023   10:57 AM 03/19/2023    8:31 AM 02/06/2023    8:26 AM  Depression screen PHQ 2/9  Decreased Interest 0 0 1  Down, Depressed, Hopeless 1 0 1  PHQ - 2 Score 1 0 2  Altered sleeping 1 1 1   Tired, decreased energy 0 0 3  Change in appetite 0 0 1  Feeling bad or failure about yourself  2 1 1   Trouble concentrating 3 2 3   Moving slowly or fidgety/restless 2 2 2   Suicidal thoughts 0 0 0  PHQ-9 Score 9 6 13   Difficult doing work/chores Not difficult at all Not difficult at all Somewhat difficult       05/28/2023   10:57 AM 03/19/2023    8:33 AM 02/06/2023    8:26 AM 09/16/2022    3:41 PM  GAD 7 : Generalized Anxiety Score  Nervous, Anxious, on Edge 3 1 3 1   Control/stop worrying 2 1 0 1  Worry too much - different things 1 1 1 1   Trouble relaxing 1 0 1 1  Restless 1 1 2  0  Easily  annoyed or irritable 1 0 1 1  Afraid - awful might happen 2 0 0 0  Total GAD 7 Score 11 4 8 5   Anxiety Difficulty Not difficult at all Not difficult at all Somewhat difficult     Outpatient Medications Prior to Visit  Medication Sig   acetaminophen (TYLENOL) 500 MG tablet Take 1,000 mg by mouth every 6 (six) hours as needed for mild pain.   fluticasone (FLONASE) 50 MCG/ACT nasal spray Place 2 sprays into both nostrils daily.   omeprazole (PRILOSEC) 20 MG capsule TAKE 1 CAPSULE BY MOUTH EVERY DAY   [DISCONTINUED] ALPRAZolam (XANAX) 0.5 MG tablet TAKE 1 TABLET BY MOUTH TWICE A DAY AS NEEDED FOR ANXIETY   [DISCONTINUED] sertraline (ZOLOFT) 100 MG tablet TAKE 1.5 TABLETS (150MG  TOTAL) BY MOUTH DAILY   [DISCONTINUED] traZODone (DESYREL) 150 MG tablet Take 1 tablet po QHS prn   [DISCONTINUED] Vitamin D, Ergocalciferol, (DRISDOL) 1.25 MG (50000 UNIT) CAPS capsule Take 1 capsule (50,000 Units total) by mouth every 7 (seven) days.   No facility-administered medications prior to visit.    ROS Negative unless otherwise noted in HPI  Objective:     BP 112/83 (BP Location: Right Arm, Patient Position: Sitting, Cuff Size: Large)   Pulse 84   Resp 20   Ht 5\' 5"  (1.651 m)   Wt 228 lb (103.4 kg)   SpO2 98%   BMI 37.94 kg/m   Physical Exam Constitutional:      General: She is not in acute distress.    Appearance: Normal appearance.  HENT:     Head: Normocephalic and atraumatic.  Pulmonary:     Effort: Pulmonary effort is normal. No respiratory distress.  Musculoskeletal:     Cervical back: Normal range of motion.  Neurological:     General: No focal deficit present.     Mental Status: She is alert and oriented to person, place, and time. Mental status is at baseline.  Psychiatric:        Mood and Affect: Mood normal.        Thought Content: Thought content normal.        Judgment: Judgment normal.     Assessment & Plan:  Insomnia, unspecified type Assessment & Plan: Discussed  working towards decreasing and eventually stopping daily use of Xanax.  Since she is taking such small amounts of trazodone, sent prescription for smaller tablets.  She may continue trazodone 25-100 mg nightly at bedtime.  Will continue to monitor.  Orders: -     traZODone HCl; Take 0.5-2 tablets (25-100 mg total) by mouth at bedtime.  Dispense: 45 tablet; Refill: 1  Anxiety Assessment & Plan: Continue Zoloft 100 mg daily with an additional 50 mg every other day.  We discussed that alprazolam is only indicated for rescue use and is not recommended to be used daily, so our goal will be to get her anxiety and insomnia under better control so that she does not need to take it every night.  Until then, use alprazolam 0.5 mg daily at bedtime, and attempt to decrease the dose occasionally to see how she tolerates it.  May consider adding buspirone in the future for better control of anxiety if Zoloft is not enough to completely control anxiety symptoms.  Will continue to monitor.  Orders: -     ALPRAZolam; Take 1 tablet (0.5 mg total) by mouth at bedtime as needed for anxiety.  Dispense: 30 tablet; Refill: 1 -     Sertraline HCl; Take 1.5 tablets (150 mg total) by mouth daily.  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  Will continue to monitor.   Mixed hyperlipidemia Assessment & Plan: Last lipid panel: LDL 160, HDL 39, triglycerides 147.  LDL has improved from last check which was at 202. The 10-year ASCVD risk score (Arnett DK, et al., 2019) is: 2%.  Continue with low-fat diet and routine physical activity.   Vitamin D deficiency Assessment & Plan:  Vitamin D within normal limits 47.1.  Continue over-the-counter vitamin D3 2000 unit daily supplement.   She is considering the shingles vaccine.  We discussed that this can be  done in our office at her next in person appointment if she would like, or she can get it at her local pharmacy.  Return in about 2 months (around 07/28/2023) for follow-up, in person or video with or without Shingles shot.    Melida Quitter, PA

## 2023-06-03 DIAGNOSIS — Z419 Encounter for procedure for purposes other than remedying health state, unspecified: Secondary | ICD-10-CM | POA: Diagnosis not present

## 2023-06-06 ENCOUNTER — Other Ambulatory Visit: Payer: Self-pay | Admitting: Family Medicine

## 2023-06-06 DIAGNOSIS — Z1211 Encounter for screening for malignant neoplasm of colon: Secondary | ICD-10-CM

## 2023-06-06 DIAGNOSIS — Z1212 Encounter for screening for malignant neoplasm of rectum: Secondary | ICD-10-CM

## 2023-06-22 ENCOUNTER — Other Ambulatory Visit: Payer: Self-pay | Admitting: Family Medicine

## 2023-06-22 DIAGNOSIS — G4700 Insomnia, unspecified: Secondary | ICD-10-CM

## 2023-07-04 DIAGNOSIS — Z419 Encounter for procedure for purposes other than remedying health state, unspecified: Secondary | ICD-10-CM | POA: Diagnosis not present

## 2023-07-29 ENCOUNTER — Other Ambulatory Visit: Payer: Self-pay | Admitting: Family Medicine

## 2023-07-29 DIAGNOSIS — F419 Anxiety disorder, unspecified: Secondary | ICD-10-CM

## 2023-07-30 MED ORDER — SERTRALINE HCL 100 MG PO TABS: 150.0000 mg | ORAL_TABLET | Freq: Every day | ORAL | 0 refills | Status: DC

## 2023-08-01 ENCOUNTER — Other Ambulatory Visit: Payer: Self-pay | Admitting: Family Medicine

## 2023-08-01 DIAGNOSIS — F419 Anxiety disorder, unspecified: Secondary | ICD-10-CM

## 2023-08-03 DIAGNOSIS — Z419 Encounter for procedure for purposes other than remedying health state, unspecified: Secondary | ICD-10-CM | POA: Diagnosis not present

## 2023-08-04 ENCOUNTER — Encounter: Payer: Self-pay | Admitting: Family Medicine

## 2023-08-08 ENCOUNTER — Ambulatory Visit: Payer: Medicaid Other | Admitting: Family Medicine

## 2023-08-13 ENCOUNTER — Ambulatory Visit: Payer: Medicaid Other | Admitting: Family Medicine

## 2023-08-24 ENCOUNTER — Other Ambulatory Visit: Payer: Self-pay | Admitting: Family Medicine

## 2023-08-24 DIAGNOSIS — F419 Anxiety disorder, unspecified: Secondary | ICD-10-CM

## 2023-09-03 DIAGNOSIS — Z419 Encounter for procedure for purposes other than remedying health state, unspecified: Secondary | ICD-10-CM | POA: Diagnosis not present

## 2023-09-17 ENCOUNTER — Ambulatory Visit (INDEPENDENT_AMBULATORY_CARE_PROVIDER_SITE_OTHER): Payer: Medicaid Other | Admitting: Family Medicine

## 2023-09-17 ENCOUNTER — Encounter: Payer: Self-pay | Admitting: Family Medicine

## 2023-09-17 ENCOUNTER — Ambulatory Visit: Payer: Medicaid Other | Attending: Family Medicine

## 2023-09-17 VITALS — BP 111/75 | HR 69 | Ht 65.0 in | Wt 206.8 lb

## 2023-09-17 DIAGNOSIS — F419 Anxiety disorder, unspecified: Secondary | ICD-10-CM | POA: Diagnosis not present

## 2023-09-17 DIAGNOSIS — G4700 Insomnia, unspecified: Secondary | ICD-10-CM | POA: Diagnosis not present

## 2023-09-17 DIAGNOSIS — F330 Major depressive disorder, recurrent, mild: Secondary | ICD-10-CM | POA: Diagnosis not present

## 2023-09-17 DIAGNOSIS — R002 Palpitations: Secondary | ICD-10-CM

## 2023-09-17 MED ORDER — BUSPIRONE HCL 10 MG PO TABS: ORAL_TABLET | ORAL | 0 refills | Status: DC

## 2023-09-17 MED ORDER — SERTRALINE HCL 100 MG PO TABS: 100.0000 mg | ORAL_TABLET | Freq: Every day | ORAL | 1 refills | Status: DC

## 2023-09-17 MED ORDER — TRAZODONE HCL 50 MG PO TABS: 25.0000 mg | ORAL_TABLET | Freq: Every day | ORAL | 0 refills | Status: DC

## 2023-09-17 MED ORDER — ALPRAZOLAM 0.5 MG PO TABS: 0.2500 mg | ORAL_TABLET | Freq: Every evening | ORAL | 0 refills | Status: DC | PRN

## 2023-09-17 NOTE — Progress Notes (Signed)
 Established Patient Office Visit  Subjective   Patient ID: Marie Ingram, female    DOB: [birth year]  Age: 53 y.o. MRN: [id number]  Chief Complaint  Patient presents with   Anxiety   Insomnia    HPI Marie Ingram is a 53 y.o. female presenting today for follow up of insomnia, mood.  She also endorses experiencing racing heart beat every morning and most days at some point in the middle of the day.  This does concern her because she has a family history of heart disease. Insomnia: She is taking about 50 mg trazodone  nightly, denies side effects.  Taking more than 75 mg of trazodone  at night makes her very groggy the next day.  She continues to struggle with sleep induction, but she has cut back significantly on her use of Xanax .  Her most recent prescription of  #25 0.5 mg tablets lasted from 08/04/2023 through 09/17/2023.  Now, she finds that even when she feels very tired, once she gets into bed she struggles with racing thoughts. Mood: Patient is here to follow up for depression and anxiety, currently managing with Zoloft  100 mg daily. Taking medication without side effects, reports excellent compliance with treatment. Denies mood changes or SI/HI. She feels mood is fairly stable since last visit.     09/17/2023   10:12 AM 05/28/2023   10:57 AM 03/19/2023    8:31 AM  Depression screen PHQ 2/9  Decreased Interest 1 0 0  Down, Depressed, Hopeless 0 1 0  PHQ - 2 Score 1 1 0  Altered sleeping 3 1 1   Tired, decreased energy 1 0 0  Change in appetite 0 0 0  Feeling bad or failure about yourself  1 2 1   Trouble concentrating 3 3 2   Moving slowly or fidgety/restless 2 2 2   Suicidal thoughts 0 0 0  PHQ-9 Score 11 9 6   Difficult doing work/chores Not difficult at all Not difficult at all Not difficult at all       09/17/2023   10:13 AM 05/28/2023   10:57 AM 03/19/2023    8:33 AM 02/06/2023    8:26 AM  GAD 7 : Generalized Anxiety Score  Nervous, Anxious, on Edge 1 3 1 3   Control/stop  worrying 1 2 1  0  Worry too much - different things 1 1 1 1   Trouble relaxing 1 1 0 1  Restless 1 1 1 2   Easily annoyed or irritable 2 1 0 1  Afraid - awful might happen 1 2 0 0  Total GAD 7 Score 8 11 4 8   Anxiety Difficulty Not difficult at all Not difficult at all Not difficult at all Somewhat difficult     Outpatient Medications Prior to Visit  Medication Sig   acetaminophen  (TYLENOL ) 500 MG tablet Take 1,000 mg by mouth every 6 (six) hours as needed for mild pain.   omeprazole  (PRILOSEC) 20 MG capsule TAKE 1 CAPSULE BY MOUTH EVERY DAY   [DISCONTINUED] ALPRAZolam  (XANAX ) 0.5 MG tablet Take 0.5-1 tablets (0.25-0.5 mg total) by mouth at bedtime as needed for anxiety.   [DISCONTINUED] sertraline  (ZOLOFT ) 100 MG tablet TAKE 1.5 TABLETS (150MG  TOTAL) BY MOUTH DAILY   [DISCONTINUED] traZODone  (DESYREL ) 50 MG tablet TAKE 0.5-2 TABLETS (25-100 MG TOTAL) BY MOUTH AT BEDTIME.   [DISCONTINUED] fluticasone  (FLONASE ) 50 MCG/ACT nasal spray Place 2 sprays into both nostrils daily.   No facility-administered medications prior to visit.    ROS Negative unless otherwise noted in HPI  Objective:     BP 111/75   Pulse 69   Ht 5\' 5"  (1.651 m)   Wt 206 lb 12 oz (93.8 kg)   SpO2 98%   BMI 34.41 kg/m   Physical Exam Constitutional:      General: She is not in acute distress.    Appearance: Normal appearance.  HENT:     Head: Normocephalic and atraumatic.  Pulmonary:     Effort: Pulmonary effort is normal. No respiratory distress.  Musculoskeletal:     Cervical back: Normal range of motion.  Neurological:     General: No focal deficit present.     Mental Status: She is alert and oriented to person, place, and time. Mental status is at baseline.  Psychiatric:        Mood and Affect: Mood normal.        Thought Content: Thought content normal.        Judgment: Judgment normal.      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  Continue Zoloft  100 mg daily.  Will continue to monitor.   Anxiety Assessment & Plan: Congratulated her on cutting back on Xanax  so much.  Provided refill, PDMP reviewed with no aberrancies, overdose risk score 300.  Continue Zoloft  100 mg daily.  Given recent thoughts keeping her awake at night, add buspirone  10 mg daily at bedtime.  If this proves ineffective, may consider switching trazodone  to mirtazapine, doxepin, or Seroquel.  Will continue to monitor.  Orders: -     busPIRone  HCl; Take 0.5 tablets (5 mg total) by mouth at bedtime for 2 days, THEN 1 tablet (10 mg total) at bedtime.  Dispense: 90 tablet; Refill: 0 -     ALPRAZolam ; Take 0.5-1 tablets (0.25-0.5 mg total) by mouth at bedtime as needed for anxiety.  Dispense: 20 tablet; Refill: 0 -     Sertraline  HCl; Take 1 tablet (100 mg total) by mouth daily.  Dispense: 90 tablet; Refill: 1  Insomnia, unspecified type Assessment & Plan: Continue working towards decreasing and eventually stopping daily use of Xanax . Continue trazodone  25-100 mg nightly at bedtime, add buspirone  10 mg nightly at bedtime for improved anxiety to help her fall asleep.  If this proves ineffective, consider switching trazodone  to mirtazapine, doxepin, or Seroquel.  Will continue to monitor.  Orders: -     traZODone  HCl; Take 0.5-2 tablets (25-100 mg total) by mouth at bedtime.  Dispense: 180 tablet; Refill: 0  Palpitations -     LONG TERM MONITOR (3-14 DAYS); Future    Return in about 5 weeks (around 10/22/2023) for follow-up for sleep, anxiety, in person or video.    Noreene Bearded, PA

## 2023-09-17 NOTE — Assessment & Plan Note (Addendum)
 Congratulated her on cutting back on Xanax  so much.  Provided refill, PDMP reviewed with no aberrancies, overdose risk score 300.  Continue Zoloft  100 mg daily.  Given recent thoughts keeping her awake at night, add buspirone  10 mg daily at bedtime.  If this proves ineffective, may consider switching trazodone  to mirtazapine, doxepin, or Seroquel.  Will continue to monitor.

## 2023-09-17 NOTE — Assessment & Plan Note (Signed)
 Continue working towards decreasing and eventually stopping daily use of Xanax . Continue trazodone  25-100 mg nightly at bedtime, add buspirone  10 mg nightly at bedtime for improved anxiety to help her fall asleep.  If this proves ineffective, consider switching trazodone  to mirtazapine, doxepin, or Seroquel.  Will continue to monitor.

## 2023-09-17 NOTE — Progress Notes (Unsigned)
 EP to read.

## 2023-09-17 NOTE — Assessment & Plan Note (Signed)
 PHQ-9 score 11.  Continue Zoloft  100 mg daily.  Will continue to monitor.

## 2023-09-22 ENCOUNTER — Encounter: Payer: Self-pay | Admitting: Family Medicine

## 2023-09-28 DIAGNOSIS — R002 Palpitations: Secondary | ICD-10-CM

## 2023-10-04 DIAGNOSIS — Z419 Encounter for procedure for purposes other than remedying health state, unspecified: Secondary | ICD-10-CM | POA: Diagnosis not present

## 2023-10-09 ENCOUNTER — Other Ambulatory Visit: Payer: Self-pay | Admitting: Family Medicine

## 2023-10-09 ENCOUNTER — Encounter: Payer: Self-pay | Admitting: Family Medicine

## 2023-10-09 DIAGNOSIS — F419 Anxiety disorder, unspecified: Secondary | ICD-10-CM

## 2023-10-16 ENCOUNTER — Encounter: Payer: Self-pay | Admitting: Family Medicine

## 2023-10-16 ENCOUNTER — Other Ambulatory Visit: Payer: Self-pay | Admitting: Family Medicine

## 2023-10-16 DIAGNOSIS — F419 Anxiety disorder, unspecified: Secondary | ICD-10-CM

## 2023-10-16 MED ORDER — ALPRAZOLAM 0.5 MG PO TABS: 0.2500 mg | ORAL_TABLET | Freq: Every evening | ORAL | 0 refills | Status: DC | PRN

## 2023-10-29 ENCOUNTER — Encounter: Payer: Self-pay | Admitting: Family Medicine

## 2023-10-29 ENCOUNTER — Telehealth (INDEPENDENT_AMBULATORY_CARE_PROVIDER_SITE_OTHER): Payer: Medicaid Other | Admitting: Family Medicine

## 2023-10-29 DIAGNOSIS — F419 Anxiety disorder, unspecified: Secondary | ICD-10-CM | POA: Diagnosis not present

## 2023-10-29 DIAGNOSIS — G4700 Insomnia, unspecified: Secondary | ICD-10-CM | POA: Diagnosis not present

## 2023-10-29 NOTE — Assessment & Plan Note (Signed)
 Continue Zoloft 100 mg daily.  Will continue to work with her on cutting back on Xanax use.Provided refill, PDMP reviewed with no aberrancies, overdose risk score 300.  If this proves ineffective, may consider increasing Zoloft or switching trazodone to mirtazapine, doxepin, or Seroquel.  Will continue to monitor.

## 2023-10-29 NOTE — Assessment & Plan Note (Signed)
 Continue working towards decreasing and eventually stopping daily use of Xanax. Continue trazodone 100 mg nightly at bedtime.  In the future may consider switching trazodone to mirtazapine, doxepin, or Seroquel to improve anxiety as well.  Will continue to monitor.

## 2023-10-29 NOTE — Progress Notes (Signed)
 Virtual Visit via Video Note  I connected with Marie Ingram on 10/29/23 at  1:10 PM EST by a video enabled telemedicine application and verified that I am speaking with the correct person using two identifiers.  Patient Location: Home Provider Location: Office/clinic  I discussed the limitations, risks, security, and privacy concerns of performing an evaluation and management service by video and the availability of in person appointments. I also discussed with the patient that there may be a patient responsible charge related to this service. The patient expressed understanding and agreed to proceed.    Subjective   Patient ID: Marie Ingram, female    DOB: 06/11/1971  Age: 53 y.o. MRN: [id number]  Chief Complaint  Patient presents with   Anxiety   Insomnia    HPI Marie Ingram is a 53 y.o. female presenting today for follow up of sleep and anxiety. She has been taking trazodone 100 mg nightly as well as Xanax 0.25 mg nightly for sleep, reports sleeping well. She tried buspirone but experienced headaches and worsening low mood. She also notes that she typically experienced some seasonal depression and wonders if that may have contributed as well. She has a grandchild that is due in 10 weeks and she is wanting to get through that before adjusting medications again.  ROS Negative unless otherwise noted in HPI  Outpatient Medications Prior to Visit  Medication Sig   acetaminophen (TYLENOL) 500 MG tablet Take 1,000 mg by mouth every 6 (six) hours as needed for mild pain.   ALPRAZolam (XANAX) 0.5 MG tablet Take 0.5-1 tablets (0.25-0.5 mg total) by mouth at bedtime as needed for anxiety.   omeprazole (PRILOSEC) 20 MG capsule TAKE 1 CAPSULE BY MOUTH EVERY DAY   sertraline (ZOLOFT) 100 MG tablet Take 1 tablet (100 mg total) by mouth daily.   traZODone (DESYREL) 50 MG tablet Take 0.5-2 tablets (25-100 mg total) by mouth at bedtime.   [DISCONTINUED] busPIRone (BUSPAR) 10 MG tablet  TAKE 0.5 TABLETS BY MOUTH AT BEDTIME FOR 2 DAYS, THEN 1 TABLET AT BEDTIME.   No facility-administered medications prior to visit.     Objective:     Physical Exam General: Speaking clearly in complete sentences without any shortness of breath.  Alert and oriented x3.  Normal judgment. No apparent acute distress.   Assessment & Plan:  Anxiety Assessment & Plan:  Continue Zoloft 100 mg daily.  Will continue to work with her on cutting back on Xanax use.Provided refill, PDMP reviewed with no aberrancies, overdose risk score 300.  If this proves ineffective, may consider increasing Zoloft or switching trazodone to mirtazapine, doxepin, or Seroquel.  Will continue to monitor.   Insomnia, unspecified type Assessment & Plan: Continue working towards decreasing and eventually stopping daily use of Xanax. Continue trazodone 100 mg nightly at bedtime.  In the future may consider switching trazodone to mirtazapine, doxepin, or Seroquel to improve anxiety as well.  Will continue to monitor.     Return in about 2 months (around 01/09/2024) for follow-up for mood.   I discussed the assessment and treatment plan with the patient. The patient was provided an opportunity to ask questions, and all were answered. The patient agreed with the plan and demonstrated an understanding of the instructions.   The patient was advised to call back or seek an in-person evaluation if the symptoms worsen or if the condition fails to improve as anticipated.  The above assessment and management plan was discussed with the patient.  The patient verbalized understanding of and has agreed to the management plan.   Melida Quitter, PA

## 2023-11-01 DIAGNOSIS — Z419 Encounter for procedure for purposes other than remedying health state, unspecified: Secondary | ICD-10-CM | POA: Diagnosis not present

## 2023-11-14 ENCOUNTER — Other Ambulatory Visit: Payer: Self-pay | Admitting: Family Medicine

## 2023-11-14 DIAGNOSIS — F419 Anxiety disorder, unspecified: Secondary | ICD-10-CM

## 2023-12-13 DIAGNOSIS — Z419 Encounter for procedure for purposes other than remedying health state, unspecified: Secondary | ICD-10-CM | POA: Diagnosis not present

## 2023-12-15 ENCOUNTER — Other Ambulatory Visit: Payer: Self-pay | Admitting: Family Medicine

## 2023-12-15 DIAGNOSIS — F419 Anxiety disorder, unspecified: Secondary | ICD-10-CM

## 2023-12-16 ENCOUNTER — Other Ambulatory Visit: Payer: Self-pay | Admitting: Family Medicine

## 2023-12-16 DIAGNOSIS — F419 Anxiety disorder, unspecified: Secondary | ICD-10-CM

## 2023-12-16 MED ORDER — ALPRAZOLAM 0.5 MG PO TABS: 0.2500 mg | ORAL_TABLET | Freq: Every evening | ORAL | 0 refills | Status: DC | PRN

## 2023-12-25 ENCOUNTER — Other Ambulatory Visit: Payer: Self-pay | Admitting: Family Medicine

## 2023-12-25 DIAGNOSIS — G4700 Insomnia, unspecified: Secondary | ICD-10-CM

## 2023-12-25 MED ORDER — TRAZODONE HCL 50 MG PO TABS: 25.0000 mg | ORAL_TABLET | Freq: Every day | ORAL | 0 refills | Status: DC

## 2024-01-12 DIAGNOSIS — Z419 Encounter for procedure for purposes other than remedying health state, unspecified: Secondary | ICD-10-CM | POA: Diagnosis not present

## 2024-01-14 ENCOUNTER — Other Ambulatory Visit: Payer: Self-pay | Admitting: Family Medicine

## 2024-01-14 DIAGNOSIS — F419 Anxiety disorder, unspecified: Secondary | ICD-10-CM

## 2024-01-15 MED ORDER — ALPRAZOLAM 0.5 MG PO TABS
0.2500 mg | ORAL_TABLET | Freq: Every evening | ORAL | 0 refills | Status: AC | PRN
Start: 2024-01-15 — End: ?

## 2024-01-16 ENCOUNTER — Telehealth: Payer: Self-pay | Admitting: *Deleted

## 2024-01-16 NOTE — Telephone Encounter (Signed)
 Lvm for pt to call office to schedule an appointment with new provider after 02/09/24

## 2024-01-19 NOTE — Telephone Encounter (Signed)
 2nd attempt to reach pt

## 2024-02-12 DIAGNOSIS — Z419 Encounter for procedure for purposes other than remedying health state, unspecified: Secondary | ICD-10-CM | POA: Diagnosis not present

## 2024-02-17 ENCOUNTER — Other Ambulatory Visit: Payer: Self-pay | Admitting: Family Medicine

## 2024-02-17 DIAGNOSIS — F419 Anxiety disorder, unspecified: Secondary | ICD-10-CM

## 2024-02-17 MED ORDER — ALPRAZOLAM 0.5 MG PO TABS: 0.2500 mg | ORAL_TABLET | Freq: Every evening | ORAL | 0 refills | Status: DC | PRN

## 2024-02-17 NOTE — Telephone Encounter (Signed)
 Last Fill: 01/15/24 20 tabs/0 RF  Last OV: 10/29/23 Next OV: 04/08/24  Routing to provider for review/authorization.

## 2024-02-17 NOTE — Telephone Encounter (Signed)
 PDMP reviewed, no aberrancies. Overdose risk score 300. Patient has follow up scheduled for Aug. Refill appropriate.

## 2024-02-17 NOTE — Telephone Encounter (Unsigned)
 Copied from CRM [id number]. Topic: Clinical - Medication Refill >> Feb 17, 2024  8:59 AM Elle L wrote: Medication: ALPRAZolam  (XANAX ) 0.5 MG tablet  Has the patient contacted their pharmacy? Yes  This is the patient's preferred pharmacy:  [facility] [phone] [facility], [place] - [address] [place] [address] Phone: [phone] Fax: [phone]  Is this the correct pharmacy for this prescription? Yes  Has the prescription been filled recently? Yes  Is the patient out of the medication? Yes  Has the patient been seen for an appointment in the last year OR does the patient have an upcoming appointment? Yes  Can we respond through MyChart? Yes  Agent: Please be advised that Rx refills may take up to 3 business days. We ask that you follow-up with your pharmacy.

## 2024-03-13 DIAGNOSIS — Z419 Encounter for procedure for purposes other than remedying health state, unspecified: Secondary | ICD-10-CM | POA: Diagnosis not present

## 2024-03-16 ENCOUNTER — Other Ambulatory Visit: Payer: Self-pay

## 2024-03-16 DIAGNOSIS — F419 Anxiety disorder, unspecified: Secondary | ICD-10-CM

## 2024-03-16 NOTE — Telephone Encounter (Signed)
PDMP reviewed, no aberrancies

## 2024-04-06 ENCOUNTER — Other Ambulatory Visit: Payer: Self-pay | Admitting: Family Medicine

## 2024-04-06 DIAGNOSIS — F419 Anxiety disorder, unspecified: Secondary | ICD-10-CM

## 2024-04-08 ENCOUNTER — Encounter

## 2024-04-13 DIAGNOSIS — Z419 Encounter for procedure for purposes other than remedying health state, unspecified: Secondary | ICD-10-CM | POA: Diagnosis not present

## 2024-04-16 ENCOUNTER — Other Ambulatory Visit: Payer: Self-pay | Admitting: Family Medicine

## 2024-04-16 DIAGNOSIS — F419 Anxiety disorder, unspecified: Secondary | ICD-10-CM

## 2024-04-16 NOTE — Telephone Encounter (Unsigned)
 Copied from CRM [id number]. Topic: Clinical - Medication Refill >> Apr 16, 2024  9:04 AM Mia F wrote: Medication: ALPRAZolam  (XANAX ) 0.5 MG tablet   Has the patient contacted their pharmacy? Yes (Agent: If no, request that the patient contact the pharmacy for the refill. If patient does not wish to contact the pharmacy document the reason why and proceed with request.) (Agent: If yes, when and what did the pharmacy advise?)  This is the patient's preferred pharmacy:  [facility] [phone] [address], [place] - [address] [place] [address] Phone: [phone] Fax: [phone]  Is this the correct pharmacy for this prescription? Yes If no, delete pharmacy and type the correct one.   Has the prescription been filled recently? Yes  Is the patient out of the medication? Yes  Has the patient been seen for an appointment in the last year OR does the patient have an upcoming appointment? Yes  Can we respond through MyChart? No  Agent: Please be advised that Rx refills may take up to 3 business days. We ask that you follow-up with your pharmacy.

## 2024-04-16 NOTE — Telephone Encounter (Signed)
 03/16/24 20 tabs/0 RF

## 2024-04-19 MED ORDER — ALPRAZOLAM 0.5 MG PO TABS: 0.5000 mg | ORAL_TABLET | Freq: Every evening | ORAL | 0 refills | Status: DC | PRN

## 2024-04-21 ENCOUNTER — Telehealth

## 2024-04-22 ENCOUNTER — Ambulatory Visit: Payer: Self-pay

## 2024-04-22 ENCOUNTER — Ambulatory Visit (INDEPENDENT_AMBULATORY_CARE_PROVIDER_SITE_OTHER)

## 2024-04-22 DIAGNOSIS — H00013 Hordeolum externum right eye, unspecified eyelid: Secondary | ICD-10-CM | POA: Insufficient documentation

## 2024-04-22 DIAGNOSIS — H00012 Hordeolum externum right lower eyelid: Secondary | ICD-10-CM | POA: Diagnosis not present

## 2024-04-22 MED ORDER — ERYTHROMYCIN 5 MG/GM OP OINT: 1.0000 | TOPICAL_OINTMENT | Freq: Two times a day (BID) | OPHTHALMIC | 0 refills | Status: DC

## 2024-04-22 NOTE — Progress Notes (Signed)
   Acute Office Visit  Subjective:     Patient ID: Marie Ingram, female    DOB: 04-13-1971, 53 y.o.   MRN: [id number]  Chief Complaint  Patient presents with   Facial Swelling    Onset:    HPI  History of Present Illness   Marie Ingram is a 53 year old female who presents with right eye swelling and redness.  Right periorbital swelling and erythema - Swelling and redness of the right eye began with eyelid erythema on Monday or Tuesday - Redness was noticeable by Tuesday; swelling appeared Wednesday morning upon waking - Sensation similar to a stye, with discomfort upon touch but no significant pain - Warm compresses and saline eye drops used for symptom management - No vision changes, fever, runny nose, discharge, or crusting - History of styes, current episode less severe - No contact lenses    ROS Per HPI     Objective:    There were no vitals taken for this visit.   Physical Exam Constitutional:      General: She is not in acute distress.    Appearance: Normal appearance.  Eyes:     General:        Right eye: Hordeolum present. No discharge.        Left eye: No discharge.     Extraocular Movements: Extraocular movements intact.     Right eye: Normal extraocular motion.     Left eye: Normal extraocular motion.     Conjunctiva/sclera: Conjunctivae normal.     Comments: Swelling/puffiness  with overlying erythema noted to the lower outer edge of right eye lid, just above zygomatic arch  Cardiovascular:     Rate and Rhythm: Normal rate and regular rhythm.     Heart sounds: Normal heart sounds. No murmur heard.    No friction rub. No gallop.  Pulmonary:     Effort: Pulmonary effort is normal. No respiratory distress.     Breath sounds: Normal breath sounds.  Musculoskeletal:        General: No swelling.  Skin:    General: Skin is warm and dry.  Neurological:     General: No focal deficit present.     Mental Status: She is alert.  Psychiatric:         Mood and Affect: Mood normal.        Behavior: Behavior normal.        Thought Content: Thought content normal.    No results found for any visits on 04/22/24.      Assessment & Plan:  Hordeolum externum of right lower eyelid Assessment & Plan: Acute stye with swelling and redness. No vision changes, discharge, or fever. Swelling likely due to immune response. Allergic reaction less likely. No known angio-edema causing medications on her med list. - Prescribed erythromycin  ointment twice daily for 7-10 days. - Advised warm compresses to affected area. - Recommended ibuprofen  for anti-inflammatory effect. - Suggested Benadryl at night for puffiness, if desired. - Instructed to report if symptoms worsen or persist beyond 3-5 days. - Advised to contact for ointment refill if needed before one week.    Other orders -     Erythromycin ; Place 1 Application into the right eye 2 (two) times daily.  Dispense: 3.5 g; Refill: 0     Return if symptoms worsen or fail to improve.  Saddie JULIANNA Sacks, PA-C

## 2024-04-22 NOTE — Telephone Encounter (Signed)
 FYI Only or Action Required?: FYI only for provider.  Patient was last seen in primary care on 09/16/2022 by Hanford Powell BRAVO, NP.  Called Nurse Triage reporting Eye Problem.  Symptoms began several days ago.  Interventions attempted: Nothing.  Symptoms are: gradually worsening.  Triage Disposition: See HCP Within 4 Hours (Or PCP Triage)  Patient/caregiver understands and will follow disposition?: Yes       Copied from CRM #[id number]. Topic: Clinical - Medical Advice >> Apr 22, 2024  8:29 AM Amy B wrote: Reason for CRM: patient has right-sided eye swelling and tenderness and would like to discuss being seen today Reason for Disposition  [1] SEVERE eyelid swelling on one side AND [2] red and painful (or tender to touch)  Answer Assessment - Initial Assessment Questions 1. ONSET: When did the swelling start? (e.g., minutes, hours, days)     Monday or Tuesday 2. LOCATION: What part of the eyelids is swollen?     Right eye lid 3. SEVERITY: How swollen is it?     Swollen and red above cheek bone to eye 4. ITCHING: Is there any itching? If Yes, ask: How much?   (Scale 1-10; mild, moderate or severe)     Mild at times 5. PAIN: Is the swelling painful to touch? If Yes, ask: How painful is it?   (Scale 1-10; mild, moderate or severe)     Sore to the touch 6. FEVER: Do you have a fever? If Yes, ask: What is it, how was it measured, and when did it start?      no 7. CAUSE: What do you think is causing the swelling?     unknown 8. RECURRENT SYMPTOM: Have you had eyelid swelling before? If Yes, ask: When was the last time? What happened that time?     denies 9. OTHER SYMPTOMS: Do you have any other symptoms? (e.g., blurred vision, eye discharge, rash, runny nose)     denies 10. PREGNANCY: Is there any chance you are pregnant? When was your last menstrual period?       na  Protocols used: Eye - Swelling-A-AH

## 2024-04-22 NOTE — Assessment & Plan Note (Signed)
 Acute stye with swelling and redness. No vision changes, discharge, or fever. Swelling likely due to immune response. Allergic reaction less likely. No known angio-edema causing medications on her med list. - Prescribed erythromycin  ointment twice daily for 7-10 days. - Advised warm compresses to affected area. - Recommended ibuprofen  for anti-inflammatory effect. - Suggested Benadryl at night for puffiness, if desired. - Instructed to report if symptoms worsen or persist beyond 3-5 days. - Advised to contact for ointment refill if needed before one week.

## 2024-04-22 NOTE — Patient Instructions (Signed)
 VISIT SUMMARY: You came in today because of swelling and redness in your right eye. You also mentioned occasional pain in your right ear. After examining you, it was determined that you have a stye in your right lower eyelid, which is causing the swelling and redness.  YOUR PLAN: -RIGHT LOWER EYELID STYE WITH ASSOCIATED SWELLING: A stye is a small, painful lump on the inside or outside of the eyelid. It is caused by a bacterial infection. For treatment, you have been prescribed erythromycin  ointment to apply twice daily for one week. You should also continue using warm compresses on the affected area. You can take ibuprofen  to help reduce inflammation and Benadryl at night if you want to reduce puffiness. Please let us  know if your symptoms get worse or do not improve within 3-5 days. If you need more ointment before the week is up, contact us  for a refill.  INSTRUCTIONS: Please follow up if your symptoms worsen or do not improve within 3-5 days. Contact us  if you need a refill of the ointment before one week.  If you have any problems before your next visit feel free to message me via MyChart (minor issues or questions) or call the office, otherwise you may reach out to schedule an office visit.  Thank you! Saddie Sacks, PA-C

## 2024-04-23 ENCOUNTER — Other Ambulatory Visit: Payer: Self-pay

## 2024-04-23 DIAGNOSIS — F419 Anxiety disorder, unspecified: Secondary | ICD-10-CM

## 2024-04-23 MED ORDER — METHYLPREDNISOLONE 4 MG PO TBPK: ORAL_TABLET | ORAL | 0 refills | Status: DC

## 2024-05-04 ENCOUNTER — Telehealth: Payer: Self-pay | Admitting: *Deleted

## 2024-05-04 NOTE — Telephone Encounter (Signed)
 Lvm for pt to call office to let her know that we needed to reschedule her appointment on 9/18/125 due to provider being out of the office that morning,if she calls back please transfer her to the office so we can get this rescheduled.

## 2024-05-06 NOTE — Telephone Encounter (Signed)
 Pt called and rescheduled this.

## 2024-05-12 ENCOUNTER — Other Ambulatory Visit: Payer: Self-pay | Admitting: Nurse Practitioner

## 2024-05-12 DIAGNOSIS — K219 Gastro-esophageal reflux disease without esophagitis: Secondary | ICD-10-CM

## 2024-05-12 MED ORDER — ALPRAZOLAM 0.5 MG PO TABS: 0.5000 mg | ORAL_TABLET | Freq: Every evening | ORAL | 0 refills | Status: DC | PRN

## 2024-05-12 NOTE — Addendum Note (Signed)
 Addended byBETHA GAYLE NUMBERS on: 05/12/2024 11:06 AM   Modules accepted: Orders

## 2024-05-14 DIAGNOSIS — Z419 Encounter for procedure for purposes other than remedying health state, unspecified: Secondary | ICD-10-CM | POA: Diagnosis not present

## 2024-05-20 ENCOUNTER — Encounter

## 2024-05-23 ENCOUNTER — Telehealth: Admitting: Family

## 2024-05-23 DIAGNOSIS — R399 Unspecified symptoms and signs involving the genitourinary system: Secondary | ICD-10-CM

## 2024-05-23 MED ORDER — NITROFURANTOIN MONOHYD MACRO 100 MG PO CAPS: 100.0000 mg | ORAL_CAPSULE | Freq: Two times a day (BID) | ORAL | 0 refills | Status: AC

## 2024-05-23 NOTE — Progress Notes (Signed)
Approximately 5 minutes was spent documenting and reviewing patient's chart.

## 2024-05-23 NOTE — Progress Notes (Signed)
 E-Visit for Urinary Problems  We are sorry that you are not feeling well.  Here is how we plan to help!  Based on what you shared with me it looks like you most likely have a simple urinary tract infection.  A UTI (Urinary Tract Infection) is a bacterial infection of the bladder.  Most cases of urinary tract infections are simple to treat but a key part of your care is to encourage you to drink plenty of fluids and watch your symptoms carefully.  I have prescribed MacroBid  100 mg twice a day for 7 days.  Your symptoms should gradually improve. Call us  if the burning in your urine worsens, you develop worsening fever, back pain or pelvic pain or if your symptoms do not resolve after completing the antibiotic.  Urinary tract infections can be prevented by drinking plenty of water to keep your body hydrated.  Also be sure when you wipe, wipe from front to back and don't hold it in!  If possible, empty your bladder every 4 hours.  HOME CARE Drink plenty of fluids Compete the full course of the antibiotics even if the symptoms resolve Remember, when you need to go.go. Holding in your urine can increase the likelihood of getting a UTI! GET HELP RIGHT AWAY IF: You cannot urinate You get a high fever Worsening back pain occurs You see blood in your urine You feel sick to your stomach or throw up You feel like you are going to pass out  MAKE SURE YOU  Understand these instructions. Will watch your condition. Will get help right away if you are not doing well or get worse.   Thank you for choosing an e-visit.  Your e-visit answers were reviewed by a board certified advanced clinical practitioner to complete your personal care plan. Depending upon the condition, your plan could have included both over the counter or prescription medications.  Please review your pharmacy choice. Make sure the pharmacy is open so you can pick up prescription now. If there is a problem, you may contact your  provider through [employer] and have the prescription routed to another pharmacy.  Your safety is important to us . If you have drug allergies check your prescription carefully.   For the next 24 hours you can use MyChart to ask questions about today's visit, request a non-urgent call back, or ask for a work or school excuse. You will get an email in the next two days asking about your experience. I hope that your e-visit has been valuable and will speed your recovery.

## 2024-06-14 ENCOUNTER — Encounter

## 2024-06-14 ENCOUNTER — Other Ambulatory Visit: Payer: Self-pay

## 2024-06-14 DIAGNOSIS — F419 Anxiety disorder, unspecified: Secondary | ICD-10-CM

## 2024-06-15 NOTE — Telephone Encounter (Signed)
 Contacted pt to see about scheduling and she stated that she was at work and would call us  back. See note below.    Marie Ingram, [place] to [facility] (Selected Message)     06/15/24  8:27 AM This patient was on my schedule for 9/18 and for yesterday, both days I had to be out of office. If she would like to reschedule, I am happy to work her in to see her this week since both of her appts have been canceled because of my schedule. Please tell her I'm sorry! I have refilled her meds for the month.

## 2024-07-15 ENCOUNTER — Other Ambulatory Visit: Payer: Self-pay

## 2024-07-15 DIAGNOSIS — F419 Anxiety disorder, unspecified: Secondary | ICD-10-CM

## 2024-08-13 DIAGNOSIS — Z419 Encounter for procedure for purposes other than remedying health state, unspecified: Secondary | ICD-10-CM | POA: Diagnosis not present

## 2024-08-16 ENCOUNTER — Other Ambulatory Visit: Payer: Self-pay | Admitting: Family Medicine

## 2024-08-16 DIAGNOSIS — F419 Anxiety disorder, unspecified: Secondary | ICD-10-CM

## 2024-09-09 ENCOUNTER — Telehealth: Payer: Self-pay

## 2024-09-09 ENCOUNTER — Ambulatory Visit

## 2024-09-09 ENCOUNTER — Other Ambulatory Visit (HOSPITAL_COMMUNITY): Payer: Self-pay

## 2024-09-09 VITALS — BP 110/75 | HR 83 | Temp 98.3°F | Ht 65.0 in | Wt 226.0 lb

## 2024-09-09 DIAGNOSIS — Z1211 Encounter for screening for malignant neoplasm of colon: Secondary | ICD-10-CM | POA: Diagnosis not present

## 2024-09-09 DIAGNOSIS — Z1231 Encounter for screening mammogram for malignant neoplasm of breast: Secondary | ICD-10-CM | POA: Diagnosis not present

## 2024-09-09 DIAGNOSIS — Z6837 Body mass index (BMI) 37.0-37.9, adult: Secondary | ICD-10-CM | POA: Diagnosis not present

## 2024-09-09 DIAGNOSIS — Z6841 Body Mass Index (BMI) 40.0 and over, adult: Secondary | ICD-10-CM | POA: Diagnosis not present

## 2024-09-09 DIAGNOSIS — F419 Anxiety disorder, unspecified: Secondary | ICD-10-CM

## 2024-09-09 DIAGNOSIS — G4700 Insomnia, unspecified: Secondary | ICD-10-CM | POA: Diagnosis not present

## 2024-09-09 DIAGNOSIS — Z0000 Encounter for general adult medical examination without abnormal findings: Secondary | ICD-10-CM | POA: Diagnosis not present

## 2024-09-09 DIAGNOSIS — E66812 Obesity, class 2: Secondary | ICD-10-CM

## 2024-09-09 DIAGNOSIS — J019 Acute sinusitis, unspecified: Secondary | ICD-10-CM | POA: Diagnosis not present

## 2024-09-09 MED ORDER — MIRTAZAPINE 15 MG PO TABS: 15.0000 mg | ORAL_TABLET | Freq: Every day | ORAL | 3 refills | Status: DC

## 2024-09-09 MED ORDER — WEGOVY 0.25 MG/0.5ML ~~LOC~~ SOAJ: 0.2500 mg | SUBCUTANEOUS | 2 refills | Status: DC

## 2024-09-09 MED ORDER — DOXYCYCLINE HYCLATE 100 MG PO TABS: 100.0000 mg | ORAL_TABLET | Freq: Two times a day (BID) | ORAL | 0 refills | Status: AC

## 2024-09-09 MED ORDER — ALPRAZOLAM 0.5 MG PO TABS: 0.5000 mg | ORAL_TABLET | Freq: Every evening | ORAL | 0 refills | Status: AC | PRN

## 2024-09-09 MED ORDER — PROMETHAZINE-CODEINE 6.25-10 MG/5ML PO SYRP: 5.0000 mL | ORAL_SOLUTION | Freq: Four times a day (QID) | ORAL | 0 refills | Status: DC | PRN

## 2024-09-09 NOTE — Progress Notes (Unsigned)
 "  Complete physical exam  Patient: Marie Ingram   DOB: 22-Mar-1971   54 y.o. Female  MRN: [id number]  Subjective:    Chief Complaint  Patient presents with   Annual Exam    Patient is here for Physical; patient stated she has been having flu-like symptoms starting last Wednesday:Cough, Sore Throat, Congestion, Headaches and Fatigue Meds Taken: DayQuil and NyQuil      History of Present Illness   Marie Ingram is a 54 year old female who presents for CPE. Would also like to address URI symptoms and weight loss.   Upper respiratory symptoms - Persistent cough, particularly bothersome at night - Sneezing and nasal congestion ongoing - Symptoms began last Wednesday with sore throat, which resolved after two days - Initial improvement in symptoms followed by worsening - Severe headache started the day before the visit - History of tonsillectomy 10-15 years ago; throat rarely sore since, but recent episode felt like 'the whole back of my throat was just on fire' for about two days  Sleep disturbance - Difficulty falling and staying asleep - Wakes up at 4 AM and remains awake for about two hours - Occasional sensation of panic upon initially falling asleep, which subsides quickly - Uses alprazolam  at night, sometimes half a tablet, occasionally a full tablet with trazodone  - Trazodone  50 mg, taking one and a half tablets at night - Previously used Lunesta  for sleep, discontinued due to bad aftertaste  Weight changes and appetite - Significant weight loss of 93-96 pounds last year - Regained approximately 30 pounds recently - Attributes weight gain to changes in appetite and mental state following birth of grandson - Previous use of Weight Watchers and phentermine  for weight management  Current medications - Alprazolam  as needed for anxiety  - Trazodone  75 mg at night (one and a half tablets) - Sertraline  (Zoloft ) 50 mg daily      Most recent fall risk assessment:     09/09/2024    8:45 AM  Fall Risk   Falls in the past year? 0  Injury with Fall? 0  Risk for fall due to : No Fall Risks  Follow up Falls evaluation completed     Most recent depression screenings:    09/09/2024    8:45 AM 09/17/2023   10:12 AM  PHQ 2/9 Scores  PHQ - 2 Score 0 1  PHQ- 9 Score 14 11      Data saved with a previous flowsheet row definition    {VISON DENTAL STD PSA (Optional):27386}  {History (Optional):23778}  Patient Care Team: Gayle Saddie JULIANNA DEVONNA as PCP - General (Physician Assistant)   Show/hide medication list[1]  ROS        Objective:     BP 110/75   Pulse 83   Temp 98.3 F (36.8 C) (Oral)   Ht 5' 5 (1.651 m)   Wt 226 lb (102.5 kg)   SpO2 98%   BMI 37.61 kg/m  {Vitals History (Optional):23777}  Physical Exam   No results found for any visits on 09/09/24. {Show previous labs (optional):23779}    Assessment & Plan:    Routine Health Maintenance and Physical Exam  Health Maintenance  Topic Date Due   Hepatitis B Vaccine (1 of 3 - 19+ 3-dose series) Never done   Cologuard (Stool DNA test)  Never done   Breast Cancer Screening  07/23/2018   COVID-19 Vaccine (3 - 2025-26 season) 09/25/2024*   Flu Shot  11/30/2024*   Zoster (  Shingles) Vaccine (1 of 2) 12/08/2024*   Pneumococcal Vaccine for age over 10 (1 of 1 - PCV) 09/09/2025*   DTaP/Tdap/Td vaccine (3 - Tdap) 09/13/2024   Pap with HPV screening  01/16/2027   Hepatitis C Screening  Completed   HIV Screening  Completed   HPV Vaccine  Aged Out   Meningitis B Vaccine  Aged Out  *Topic was postponed. The date shown is not the original due date.    Discussed health benefits of physical activity, and encouraged her to engage in regular exercise appropriate for her age and condition.  Morbid obesity with BMI of 40.0-44.9, adult (HCC) -     VITAMIN D  25 Hydroxy (Vit-D Deficiency, Fractures); Future -     TSH; Future -     Hemoglobin A1c; Future -     Lipid panel; Future -      Comprehensive metabolic panel with GFR; Future -     CBC with Differential/Platelet; Future  Anxiety -     ALPRAZolam ; Take 1 tablet (0.5 mg total) by mouth at bedtime as needed for anxiety.  Dispense: 20 tablet; Refill: 0  Screening mammogram for breast cancer -     3D Screening Mammogram, Left and Right; Future  Screen for colon cancer -     Ambulatory referral to Gastroenterology  Other orders -     Doxycycline  Hyclate; Take 1 tablet (100 mg total) by mouth 2 (two) times daily for 7 days.  Dispense: 14 tablet; Refill: 0 -     Promethazine -Codeine ; Take 5 mLs by mouth every 6 (six) hours as needed for cough.  Dispense: 120 mL; Refill: 0 -     Mirtazapine ; Take 1 tablet (15 mg total) by mouth at bedtime.  Dispense: 30 tablet; Refill: 3 -     Wegovy ; Inject 0.25 mg into the skin once a week.  Dispense: 2 mL; Refill: 2   Assessment and Plan    Acute upper respiratory infection Persistent cough, sneezing, and headache suggest sinusitis, likely viral progressing to bacterial. - Prescribed doxycycline  with food to prevent stomach upset. - Prescribed promethazine  with codeine  for nighttime cough, cautioning about drowsiness. - Advised yogurt consumption to prevent antibiotic-associated diarrhea.  Obesity Weight gain of 30 pounds. Discussed pharmacological options; chose Wegovy  for significant weight loss and appetite control. Discussed potential side effects and emphasized protein intake and calorie control. - Submitted prior authorization for Wegovy  0.25 mg weekly injection. - Provided Wegovy  Starter Kit with instructions. - Scheduled follow-up in 3 months to assess weight loss and medication tolerance. - Ordered fasting blood work for baseline labs. - Discussed dietary modifications focusing on protein intake and calorie control.  Insomnia and anxiety Current regimen includes alprazolam  and trazodone . Discussed switching to mirtazapine  for improved sleep and anxiety management. -  Prescribed mirtazapine  15 mg with trazodone  tapering instructions. - Continue alprazolam  as needed for anxiety. - Provided written tapering schedule for trazodone  to mirtazapine  transition.  General Health Maintenance Mammogram overdue. Discussed colon cancer screening options, decision deferred until after weight management. - Ordered mammogram at the breast center. - Discussed colon cancer screening options, decision deferred until after weight management.          Return in about 3 months (around 12/08/2024) for Sleep, Weight.     Saddie JULIANNA Sacks, PA-C     [1]  Outpatient Medications Prior to Visit  Medication Sig   acetaminophen  (TYLENOL ) 500 MG tablet Take 1,000 mg by mouth every 6 (six) hours as needed for mild  pain.   omeprazole  (PRILOSEC) 20 MG capsule TAKE 1 CAPSULE BY MOUTH EVERY DAY   sertraline  (ZOLOFT ) 100 MG tablet TAKE 1 TABLET BY MOUTH EVERY DAY   [DISCONTINUED] ALPRAZolam  (XANAX ) 0.5 MG tablet TAKE 1 TABLET BY MOUTH AT BEDTIME AS NEEDED FOR ANXIETY   [DISCONTINUED] traZODone  (DESYREL ) 50 MG tablet Take 0.5-2 tablets (25-100 mg total) by mouth at bedtime.   [DISCONTINUED] erythromycin  ophthalmic ointment Place 1 Application into the right eye 2 (two) times daily. (Patient not taking: Reported on 09/09/2024)   [DISCONTINUED] methylPREDNISolone  (MEDROL  DOSEPAK) 4 MG TBPK tablet Take as directed on dosing pack (Patient not taking: Reported on 09/09/2024)   No facility-administered medications prior to visit.   "

## 2024-09-09 NOTE — Patient Instructions (Signed)
 VISIT SUMMARY: During your visit, we addressed your persistent cough and sinus symptoms, weight management, sleep issues, and general health maintenance. We have prescribed medications and provided instructions to help manage these conditions.  YOUR PLAN: ACUTE UPPER RESPIRATORY INFECTION: You have a persistent cough, sneezing, and headache, which suggest sinusitis that may have progressed from a viral to a bacterial infection. -Take doxycycline  with food to prevent stomach upset. -Take promethazine  with codeine  for nighttime cough, but be cautious as it may cause drowsiness. -Consume yogurt to prevent antibiotic-associated diarrhea.  OBESITY: You have gained 30 pounds recently. We discussed weight management options and decided on Wegovy  for significant weight loss and appetite control. -We have submitted a prior authorization for Wegovy  0.25 mg weekly injection. -You received a Wegovy  Starter Kit with instructions. -Follow up in 3 months to assess weight loss and medication tolerance. -Fasting blood work has been ordered for baseline labs. -Focus on dietary modifications, especially increasing protein intake and controlling calories.  INSOMNIA AND ANXIETY: You have difficulty sleeping and occasional anxiety. We discussed changing your medication to improve sleep and anxiety management. -Start mirtazapine  15 mg and follow the provided tapering instructions for trazodone . WEEK ONE: Take 1 tablet of Trazodone  and 0.5 tablet of Mirtazapine   WEEK TWO: Take 0.5 tablet of Trazodone  and 1 full tablet of Mirtazapine   WEEK THREE: Stop Trazodone  and take 2 tablets of Mirtazapine  (If 1 tablet was working well, you can stay at 1 tablet of Mirtazapine  instead) -Continue taking alprazolam  as needed for anxiety. -Follow the written tapering schedule for transitioning from trazodone  to mirtazapine .  GENERAL HEALTH MAINTENANCE: We discussed your overdue mammogram and colon cancer screening options. -A  mammogram has been ordered at the breast center. -We will decide on colon cancer screening options after addressing your weight management.  If you have any problems before your next visit feel free to message me via MyChart (minor issues or questions) or call the office, otherwise you may reach out to schedule an office visit.  Thank you! Saddie Sacks, PA-C

## 2024-09-09 NOTE — Telephone Encounter (Signed)
 Pharmacy Patient Advocate Encounter   Received notification from Onbase CMM KEY that prior authorization for semaglutide -weight management (WEGOVY ) 0.25 MG/0.5ML SOAJ SQ injection  is required/requested.   Insurance verification completed.   The patient is insured through [facility] MEDICAID.   Per test claim: PA required; PA submitted to above mentioned insurance via Latent Key/confirmation #/EOC [id number] Status is pending

## 2024-09-10 ENCOUNTER — Other Ambulatory Visit: Payer: Self-pay

## 2024-09-10 ENCOUNTER — Telehealth: Payer: Self-pay

## 2024-09-10 DIAGNOSIS — R051 Acute cough: Secondary | ICD-10-CM

## 2024-09-10 MED ORDER — PROMETHAZINE-CODEINE 6.25-10 MG/5ML PO SYRP: 5.0000 mL | ORAL_SOLUTION | Freq: Four times a day (QID) | ORAL | 0 refills | Status: DC | PRN

## 2024-09-10 NOTE — Telephone Encounter (Signed)
 Copied from CRM #[id number]. Topic: Clinical - Prescription Issue >> Sep 10, 2024  9:10 AM Treva T wrote: Reason for CRM: Pt states went to pharmacy this morning to pick up medication, per pharmacy had sent a request to office, in regards to prescription, but has not received a response.  Medication:  promethazine -codeine  (PHENERGAN  WITH CODEINE ) 6.25-10 MG/5ML syrup  Pt requesting office to follow up with pharmacy.  Pt can be reached at [phone] to discuss further.  Aware of same day follow up call.   Preferred pharmacy: [id number] [address], [place] - [address] [place] [address] Phone: [phone] Fax: [phone]

## 2024-09-10 NOTE — Telephone Encounter (Signed)
 Rx resent.

## 2024-09-12 MED ORDER — TRAZODONE HCL 50 MG PO TABS: 100.0000 mg | ORAL_TABLET | Freq: Every evening | ORAL | 3 refills | Status: AC | PRN

## 2024-09-13 ENCOUNTER — Other Ambulatory Visit: Payer: Self-pay | Admitting: Family Medicine

## 2024-09-13 ENCOUNTER — Other Ambulatory Visit: Payer: Self-pay

## 2024-09-13 ENCOUNTER — Other Ambulatory Visit

## 2024-09-13 DIAGNOSIS — G4700 Insomnia, unspecified: Secondary | ICD-10-CM

## 2024-09-13 DIAGNOSIS — E6609 Other obesity due to excess calories: Secondary | ICD-10-CM | POA: Insufficient documentation

## 2024-09-13 DIAGNOSIS — Z0000 Encounter for general adult medical examination without abnormal findings: Secondary | ICD-10-CM | POA: Insufficient documentation

## 2024-09-13 DIAGNOSIS — J019 Acute sinusitis, unspecified: Secondary | ICD-10-CM | POA: Insufficient documentation

## 2024-09-13 MED ORDER — HYDROCODONE BIT-HOMATROP MBR 5-1.5 MG/5ML PO SOLN: 5.0000 mL | Freq: Three times a day (TID) | ORAL | 0 refills | Status: AC | PRN

## 2024-09-13 NOTE — Assessment & Plan Note (Signed)
 Persistent cough, sneezing, and headache suggest sinusitis, likely viral progressing to bacterial. - Prescribed doxycycline  with food to prevent stomach upset. - Prescribed promethazine  with codeine  for nighttime cough, cautioning about drowsiness. - Advised yogurt consumption to prevent antibiotic-associated diarrhea.

## 2024-09-13 NOTE — Assessment & Plan Note (Signed)
 Discussed mirtazapine  but patient hesitant to take due to side effect of weight gain. Increase trazodone  to 100 mg and continue using Xanax  0.5 mg as needed.  If a second agent is needed in the future, consider Doxepin.

## 2024-09-13 NOTE — Assessment & Plan Note (Signed)
-  Ordered mammogram  -Ordered colonoscopy  -Deferred tetanus for now - All other vaccines UTD

## 2024-09-13 NOTE — Assessment & Plan Note (Signed)
 BMI of 37 with comorbidity (hyperlipidemia, GERD). Patient has tried comprehensive weight loss programs including Weight Watchers and phentermine . Recent weight gain of 30 pounds. Discussed pharmacological options; chose Wegovy  for significant weight loss and appetite control. Discussed potential side effects and emphasized protein intake and calorie control. - Will submit prior authorization for Wegovy  0.25 mg weekly injection. - Provided Wegovy  Starter Kit with instructions. - Discussed that with Wegovy , she must continue with low calorie diet and exercise 5+ days per week to achieve best results.  - Scheduled follow-up in 3 months to assess weight loss and medication tolerance. - Ordered fasting blood work for baseline labs. - Discussed dietary modifications focusing on protein intake and calorie control.

## 2024-09-15 NOTE — Telephone Encounter (Signed)
 Pharmacy Patient Advocate Encounter  Received notification from [facility] MEDICAID that Prior Authorization for Wegovy  0.25MG /0.5ML auto-injectors  has been DENIED.  See denial reason below. No denial letter attached in CMM. Will attach denial letter to Media tab once received.   Please be advised

## 2024-09-16 ENCOUNTER — Other Ambulatory Visit: Payer: Self-pay

## 2024-09-16 ENCOUNTER — Telehealth: Payer: Self-pay | Admitting: Pharmacist

## 2024-09-16 NOTE — Telephone Encounter (Signed)
 Appeal has been submitted. Will advise when response is received, please be advised that most companies may take 30 days to make a decision. Appeal letter and supporting documentation have been faxed to [phone] on 09/16/2024 @1 :45 pm.  Thank you, Devere Pandy, PharmD Clinical Pharmacist  [place]  Direct Dial: [phone]

## 2024-09-16 NOTE — Telephone Encounter (Signed)
 Provider has updated chart notes

## 2024-09-17 ENCOUNTER — Other Ambulatory Visit

## 2024-09-19 LAB — CBC WITH DIFFERENTIAL/PLATELET
Basophils Absolute: 0.1 x10E3/uL (ref 0.0–0.2)
Basos: 1 %
EOS (ABSOLUTE): 0.3 x10E3/uL (ref 0.0–0.4)
Eos: 5 %
Hematocrit: 40.8 % (ref 34.0–46.6)
Hemoglobin: 12.8 g/dL (ref 11.1–15.9)
Immature Grans (Abs): 0 x10E3/uL (ref 0.0–0.1)
Immature Granulocytes: 0 %
Lymphocytes Absolute: 1.8 x10E3/uL (ref 0.7–3.1)
Lymphs: 25 %
MCH: 27.8 pg (ref 26.6–33.0)
MCHC: 31.4 g/dL — ABNORMAL LOW (ref 31.5–35.7)
MCV: 89 fL (ref 79–97)
Monocytes Absolute: 0.4 x10E3/uL (ref 0.1–0.9)
Monocytes: 6 %
Neutrophils Absolute: 4.5 x10E3/uL (ref 1.4–7.0)
Neutrophils: 63 %
Platelets: 265 x10E3/uL (ref 150–450)
RBC: 4.6 x10E6/uL (ref 3.77–5.28)
RDW: 12.8 % (ref 11.7–15.4)
WBC: 7.2 x10E3/uL (ref 3.4–10.8)

## 2024-09-19 LAB — COMPREHENSIVE METABOLIC PANEL WITH GFR
ALT: 14 IU/L (ref 0–32)
AST: 20 IU/L (ref 0–40)
Albumin: 4.2 g/dL (ref 3.8–4.9)
Alkaline Phosphatase: 89 IU/L (ref 49–135)
BUN/Creatinine Ratio: 21 (ref 9–23)
BUN: 14 mg/dL (ref 6–24)
Bilirubin Total: 0.3 mg/dL (ref 0.0–1.2)
CO2: 23 mmol/L (ref 20–29)
Calcium: 8.6 mg/dL — AB (ref 8.7–10.2)
Chloride: 104 mmol/L (ref 96–106)
Creatinine, Ser: 0.68 mg/dL (ref 0.57–1.00)
Globulin, Total: 2.2 g/dL (ref 1.5–4.5)
Glucose: 82 mg/dL (ref 70–99)
Potassium: 4.5 mmol/L (ref 3.5–5.2)
Sodium: 142 mmol/L (ref 134–144)
Total Protein: 6.4 g/dL (ref 6.0–8.5)
eGFR: 104 mL/min/1.73

## 2024-09-19 LAB — VITAMIN D 25 HYDROXY (VIT D DEFICIENCY, FRACTURES): Vit D, 25-Hydroxy: 31.2 ng/mL (ref 30.0–100.0)

## 2024-09-19 LAB — LIPID PANEL
Chol/HDL Ratio: 4.2 ratio (ref 0.0–4.4)
Cholesterol, Total: 245 mg/dL — ABNORMAL HIGH (ref 100–199)
HDL: 58 mg/dL
LDL Chol Calc (NIH): 165 mg/dL — ABNORMAL HIGH (ref 0–99)
Triglycerides: 124 mg/dL (ref 0–149)
VLDL Cholesterol Cal: 22 mg/dL (ref 5–40)

## 2024-09-19 LAB — TSH: TSH: 1.71 u[IU]/mL (ref 0.450–4.500)

## 2024-09-19 LAB — HEMOGLOBIN A1C
Est. average glucose Bld gHb Est-mCnc: 105 mg/dL
Hgb A1c MFr Bld: 5.3 % (ref 4.8–5.6)

## 2024-09-19 MED ORDER — ONDANSETRON HCL 4 MG PO TABS: 4.0000 mg | ORAL_TABLET | Freq: Three times a day (TID) | ORAL | 0 refills | Status: AC | PRN

## 2024-09-19 NOTE — Addendum Note (Signed)
 Addended byBETHA GAYLE NUMBERS on: 09/19/2024 04:34 PM   Modules accepted: Orders

## 2024-09-20 ENCOUNTER — Ambulatory Visit: Payer: Self-pay

## 2024-09-21 NOTE — Telephone Encounter (Signed)
 Appeal for Wegovy  has been approved, full letter can be found under the media tab.     Thank you, Devere Pandy, PharmD Clinical Pharmacist  [place]  Direct Dial: [phone]
